# Patient Record
Sex: Female | Born: 1995
Health system: Southern US, Community
[De-identification: ages and names within clinical notes are randomized; demographics above are authoritative.]

## PROBLEM LIST (undated history)

## (undated) ENCOUNTER — Inpatient Hospital Stay (HOSPITAL_COMMUNITY): Payer: Self-pay

## (undated) DIAGNOSIS — R87629 Unspecified abnormal cytological findings in specimens from vagina: Secondary | ICD-10-CM

## (undated) DIAGNOSIS — F419 Anxiety disorder, unspecified: Secondary | ICD-10-CM

## (undated) DIAGNOSIS — O358XX0 Maternal care for other (suspected) fetal abnormality and damage, not applicable or unspecified: Secondary | ICD-10-CM

## (undated) HISTORY — PX: NO PAST SURGERIES: SHX2092

## (undated) HISTORY — DX: Anxiety disorder, unspecified: F41.9

## (undated) HISTORY — DX: Maternal care for other (suspected) fetal abnormality and damage, not applicable or unspecified: O35.8XX0

## (undated) HISTORY — DX: Unspecified abnormal cytological findings in specimens from vagina: R87.629

---
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
## 2015-01-06 ENCOUNTER — Ambulatory Visit: Payer: Self-pay | Admitting: Gynecology

## 2015-04-16 ENCOUNTER — Ambulatory Visit (INDEPENDENT_AMBULATORY_CARE_PROVIDER_SITE_OTHER): Payer: Managed Care, Other (non HMO) | Admitting: Family Medicine

## 2015-04-16 ENCOUNTER — Encounter: Payer: Self-pay | Admitting: Family Medicine

## 2015-04-16 VITALS — BP 118/78 | HR 59 | Ht 63.0 in | Wt 149.0 lb

## 2015-04-16 DIAGNOSIS — M545 Low back pain, unspecified: Secondary | ICD-10-CM | POA: Insufficient documentation

## 2015-04-16 NOTE — Progress Notes (Signed)
   Subjective:    Patient ID: Karen Burnett, female    DOB: 08/29/1996, 19 y.o.   MRN: [id number]  HPI  Intermittent diffuse low back pain for the last 4 years. 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 A massage therapist who sees her father or so joint problems examined her back and told her that her disks were inflamed. She's concerned and thought she should get that checked out.  Pain is mid low back, does not radiate into the buttock or the groin. No leg weakness. No radiation into the legs. 4-5 out of 10 at its worst. Not present when she wakes up in the morning. No bowel or bladder incontinence.  Review of Systems See history of present illness. Additionally, no fever, sweats, chills, unusual weight change.    Objective:   Physical Exam Vital signs are reviewed GENERAL: Well-developed female no acute distress BACK: No defect noted. Nontender to percussion of the vertebra. Mildly tender to palpation in the paravertebral muscles around L4-5 and over the PSIS bilaterally. She has mildly limited flexion at the hips secondary to hamstring tightness. Hyperextension mildly increases her low back pain but there is no radiation. She has normal lateral rotation. Extremity: Lower strandy strength 5 out of 5 in flexion extension hips, knees, ankle. NEURO: DTRs bilateral knee and ankle 2+ symmetrical. Intact sensation soft touch distally.       Assessment & Plan:  Muscle skeletal back pain. I gave her back exercise handout we'll have her focus on quadruped and Superman hyperextension exercises. I'll see her back in 3-4 weeks. I reassured her a do not think this is any disc related issue. I spent greater than 50% of our 35 minute office visit in counseling education regarding the nature of this back issue versus disc disease.

## 2015-04-30 ENCOUNTER — Encounter: Payer: Self-pay | Admitting: Gynecology

## 2015-04-30 ENCOUNTER — Ambulatory Visit (INDEPENDENT_AMBULATORY_CARE_PROVIDER_SITE_OTHER): Payer: Managed Care, Other (non HMO) | Admitting: Gynecology

## 2015-04-30 VITALS — BP 122/78 | Ht 62.25 in | Wt 148.0 lb

## 2015-04-30 DIAGNOSIS — N915 Oligomenorrhea, unspecified: Secondary | ICD-10-CM

## 2015-04-30 DIAGNOSIS — Z113 Encounter for screening for infections with a predominantly sexual mode of transmission: Secondary | ICD-10-CM

## 2015-04-30 DIAGNOSIS — Z01419 Encounter for gynecological examination (general) (routine) without abnormal findings: Secondary | ICD-10-CM

## 2015-04-30 DIAGNOSIS — Z30011 Encounter for initial prescription of contraceptive pills: Secondary | ICD-10-CM | POA: Diagnosis not present

## 2015-04-30 DIAGNOSIS — L709 Acne, unspecified: Secondary | ICD-10-CM

## 2015-04-30 DIAGNOSIS — N946 Dysmenorrhea, unspecified: Secondary | ICD-10-CM

## 2015-04-30 DIAGNOSIS — N898 Other specified noninflammatory disorders of vagina: Secondary | ICD-10-CM

## 2015-04-30 LAB — CBC WITH DIFFERENTIAL/PLATELET
BASOS PCT: 0 % (ref 0–1)
Basophils Absolute: 0 10*3/uL (ref 0.0–0.1)
EOS ABS: 0.1 10*3/uL (ref 0.0–0.7)
Eosinophils Relative: 1 % (ref 0–5)
HEMATOCRIT: 39.8 % (ref 36.0–46.0)
HEMOGLOBIN: 13.5 g/dL (ref 12.0–15.0)
LYMPHS PCT: 28 % (ref 12–46)
Lymphs Abs: 2 10*3/uL (ref 0.7–4.0)
MCH: 29.2 pg (ref 26.0–34.0)
MCHC: 33.9 g/dL (ref 30.0–36.0)
MCV: 86.1 fL (ref 78.0–100.0)
MONO ABS: 0.4 10*3/uL (ref 0.1–1.0)
MPV: 10.3 fL (ref 8.6–12.4)
Monocytes Relative: 6 % (ref 3–12)
NEUTROS ABS: 4.6 10*3/uL (ref 1.7–7.7)
NEUTROS PCT: 65 % (ref 43–77)
PLATELETS: 264 10*3/uL (ref 150–400)
RBC: 4.62 MIL/uL (ref 3.87–5.11)
RDW: 13.5 % (ref 11.5–15.5)
WBC: 7.1 10*3/uL (ref 4.0–10.5)

## 2015-04-30 LAB — WET PREP FOR TRICH, YEAST, CLUE
CLUE CELLS WET PREP: NONE SEEN
Trich, Wet Prep: NONE SEEN
WBC, Wet Prep HPF POC: NONE SEEN
YEAST WET PREP: NONE SEEN

## 2015-04-30 LAB — TSH: TSH: 0.422 u[IU]/mL (ref 0.350–4.500)

## 2015-04-30 LAB — PREGNANCY, URINE: PREG TEST UR: NEGATIVE

## 2015-04-30 MED ORDER — FLUCONAZOLE 150 MG PO TABS: ORAL_TABLET | ORAL | Status: DC

## 2015-04-30 MED ORDER — LEVONORGESTREL-ETHINYL ESTRAD 0.1-20 MG-MCG PO TABS: 1.0000 | ORAL_TABLET | Freq: Every day | ORAL | Status: DC

## 2015-04-30 NOTE — Progress Notes (Signed)
Karen MooreClarisa Burnett 08/20/1996 [id number] Patient is a 19 year old who presented to the office today for her first gynecological exam. Patient stated that she has completed a HPV vaccine in the past. She states that sometimes she'll go up to 2 months without a menstrual cycle when she is also suffering from acne. She does have her menses she complaints of menstrual cramping. She stated her last menstrual cycle was April 12 and she had use condom and Plan B. She denies any nausea, vomiting, breast tenderness. She denies any visual disturbances or any unusual headaches. Patient denies any past history of any STD. Patient also was having a slight vaginal discharge as well.    Past medical history,surgical history, family history and social history were all reviewed and documented in the EPIC chart.  Gynecologic History Patient's last menstrual period was 04/05/2015. Contraception: condoms Last Pap: No prior study. Results were: No prior study Last mammogram: Not indicated. Results were: Not indicated  Obstetric History OB History  Gravida Para Term Preterm AB SAB TAB Ectopic Multiple Living  0 0 0 0 0 0 0 0 0 0          ROS: A ROS was performed and pertinent positives and negatives are included in the history.  GENERAL: No fevers or chills. HEENT: No change in vision, no earache, sore throat or sinus congestion. NECK: No pain or stiffness. CARDIOVASCULAR: No chest pain or pressure. No palpitations. PULMONARY: No shortness of breath, cough or wheeze. GASTROINTESTINAL: No abdominal pain, nausea, vomiting or diarrhea, melena or bright red blood per rectum. GENITOURINARY: No urinary frequency, urgency, hesitancy or dysuria. MUSCULOSKELETAL: No joint or muscle pain, no back pain, no recent trauma. DERMATOLOGIC: No rash, no itching, no lesions. ENDOCRINE: No polyuria, polydipsia, no heat or cold intolerance. No recent change in weight. HEMATOLOGICAL: No anemia or easy bruising or bleeding. NEUROLOGIC: No  headache, seizures, numbness, tingling or weakness. PSYCHIATRIC: No depression, no loss of interest in normal activity or change in sleep pattern.     Exam: chaperone present  BP 122/78 mmHg  Ht 5' 2.25" (1.581 m)  Wt 148 lb (67.132 kg)  BMI 26.86 kg/m2  LMP 04/05/2015  Body mass index is 26.86 kg/(m^2).  General appearance : Well developed well nourished female. No acute distress HEENT: Eyes: no retinal hemorrhage or exudates,  Neck supple, trachea midline, no carotid bruits, no thyroidmegaly Lungs: Clear to auscultation, no rhonchi or wheezes, or rib retractions  Heart: Regular rate and rhythm, no murmurs or gallops Breast:Examined in sitting and supine position were symmetrical in appearance, no palpable masses or tenderness,  no skin retraction, no nipple inversion, no nipple discharge, no skin discoloration, no axillary or supraclavicular lymphadenopathy Abdomen: no palpable masses or tenderness, no rebound or guarding Extremities: no edema or skin discoloration or tenderness  Pelvic:  Bartholin, Urethra, Skene Glands: Within normal limits             Vagina: No gross lesions or discharge  Cervix: No gross lesions or discharge  Uterus  anteverted, normal size, shape and consistency, non-tender and mobile  Adnexa  Without masses or tenderness  Anus and perineum  normal   Rectovaginal  normal sphincter tone without palpated masses or tenderness             Hemoccult not indicated  Wet prep negative  Urine pregnancy test negative   Assessment/Plan:  19 y.o. female for annual exam with history of oligomenorrhea. We'll check a TSH and prolactin today. Patient was offered  to take Provera for 10 days to jump start her cycle but since she's: On vacation she rather wait until her cycle starts on its own. She is interested in starting oral contraceptive pill. The risks benefits and pros and cons were discussed include DVT and pulmonary embolism. She denies any family history of any  family members or personal history of clotting disorders. She will be started on avian 28 day oral contraceptive pill. A CBC, urinalysis will be obtained today as well. Pap smear not indicated this year according to the new guidelines. GC and Chlamydia culture pending at time of this dictation.   Ok EdwardsFERNANDEZ,JUAN H MD, 3:25 PM 04/30/2015

## 2015-04-30 NOTE — Patient Instructions (Signed)

## 2015-04-30 NOTE — Addendum Note (Signed)
Addended by: Berna SpareASTILLO, Dereon Williamsen A on: 04/30/2015 03:43 PM   Modules accepted: Orders

## 2015-04-30 NOTE — Addendum Note (Signed)
Addended by: Rushie GoltzSPANGLER, Josejuan Hoaglin on: 04/30/2015 03:42 PM   Modules accepted: Orders

## 2015-05-01 LAB — GC/CHLAMYDIA PROBE AMP
CT PROBE, AMP APTIMA: NEGATIVE
GC PROBE AMP APTIMA: NEGATIVE

## 2015-05-01 LAB — PROLACTIN: Prolactin: 14.4 ng/mL

## 2015-05-03 LAB — URINALYSIS W MICROSCOPIC + REFLEX CULTURE
BILIRUBIN URINE: NEGATIVE
GLUCOSE, UA: NEGATIVE mg/dL
Hgb urine dipstick: NEGATIVE
KETONES UR: NEGATIVE mg/dL
Leukocytes, UA: NEGATIVE
Nitrite: NEGATIVE
PROTEIN: NEGATIVE mg/dL
UROBILINOGEN UA: 0.2 mg/dL (ref 0.0–1.0)
pH: 5 (ref 5.0–8.0)

## 2015-05-14 ENCOUNTER — Ambulatory Visit: Payer: Managed Care, Other (non HMO) | Admitting: Family Medicine

## 2015-05-31 ENCOUNTER — Ambulatory Visit: Payer: Self-pay | Admitting: Family Medicine

## 2015-06-24 ENCOUNTER — Encounter: Payer: Self-pay | Admitting: Family Medicine

## 2015-06-24 ENCOUNTER — Ambulatory Visit (INDEPENDENT_AMBULATORY_CARE_PROVIDER_SITE_OTHER): Payer: Managed Care, Other (non HMO) | Admitting: Family Medicine

## 2015-06-24 VITALS — BP 106/63 | HR 90 | Temp 97.6°F | Ht 63.0 in | Wt 147.8 lb

## 2015-06-24 DIAGNOSIS — Z7189 Other specified counseling: Secondary | ICD-10-CM

## 2015-06-24 DIAGNOSIS — Z30017 Encounter for initial prescription of implantable subdermal contraceptive: Secondary | ICD-10-CM | POA: Insufficient documentation

## 2015-06-24 DIAGNOSIS — Z7689 Persons encountering health services in other specified circumstances: Secondary | ICD-10-CM

## 2015-06-24 NOTE — Patient Instructions (Signed)
Thank you for coming in,   Good luck to you with all of your studies.   Please bring all of your medications with you to each visit.    Please feel free to call with any questions or concerns at any time, at [phone]. --Dr. Jordan LikesSchmitz

## 2015-06-24 NOTE — Assessment & Plan Note (Signed)
No complaints. Currently a student at [employer] Educated on safe sex, exercise and diet.  - f/u in one year

## 2015-06-24 NOTE — Progress Notes (Addendum)
   Subjective:     HPI  Karen Burnett is here for est care.   [place] is [place] and has moved back [place] from [place]. Reports that her immunizations are up to date. 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  Plays lots of soccer.    24 hr food recall [place] food, mashed potatoes, corn.   Health Maintenance:  Health Maintenance Due  Topic Date Due  . HIV Screening  10/20/2009  . PAP SMEAR  10/20/2012  . TETANUS/TDAP  10/20/2013    -  reports that she has never smoked. She does not have any smokeless tobacco history on file. - Review of Systems: Per HPI. All other systems reviewed and are negative. - Past Medical History: Patient Active Problem List   Diagnosis Date Noted  . Low back pain 04/16/2015   - Medications: reviewed and updated Current Outpatient Prescriptions  Medication Sig Dispense Refill  . fluconazole (DIFLUCAN) 150 MG tablet Take one now repeat in 72 hours if needed 2 tablet 0  . levonorgestrel-ethinyl estradiol (AVIANE,ALESSE,LESSINA) 0.1-20 MG-MCG tablet Take 1 tablet by mouth daily. 1 Package 11  . naproxen sodium (ANAPROX) 220 MG tablet Take 220 mg by mouth 2 (two) times daily with a meal.     No current facility-administered medications for this visit.    Review of Systems See HPI     Objective:   Physical Exam BP 106/63 mmHg  Pulse 90  Temp(Src) 97.6 F (36.4 C) (Oral)  Ht 5\' 3"  (1.6 m)  Wt 147 lb 12.8 oz (67.042 kg)  BMI 26.19 kg/m2  LMP 06/12/2015 Gen: NAD, alert, cooperative with exam, well-appearing HEENT: NCAT, clear conjunctiva CV: RRR, good S1/S2, no murmur, no edema  Resp: CTABL, no wheezes, non-labored Abd: SNTND, BS present, no guarding or organomegaly Skin: no rashes, normal turgor  Neuro: no gross deficits.  MSK: 5/5 strength in LE b/l, normal sensation         Assessment & Plan:

## 2015-09-28 ENCOUNTER — Other Ambulatory Visit: Payer: Self-pay

## 2015-09-28 MED ORDER — LEVONORGESTREL-ETHINYL ESTRAD 0.1-20 MG-MCG PO TABS: 1.0000 | ORAL_TABLET | Freq: Every day | ORAL | Status: DC

## 2015-09-30 ENCOUNTER — Ambulatory Visit: Payer: Managed Care, Other (non HMO) | Admitting: Gynecology

## 2015-10-01 ENCOUNTER — Encounter: Payer: Self-pay | Admitting: Gynecology

## 2015-10-01 ENCOUNTER — Ambulatory Visit (INDEPENDENT_AMBULATORY_CARE_PROVIDER_SITE_OTHER): Payer: Managed Care, Other (non HMO) | Admitting: Gynecology

## 2015-10-01 VITALS — BP 112/78

## 2015-10-01 DIAGNOSIS — Z833 Family history of diabetes mellitus: Secondary | ICD-10-CM | POA: Diagnosis not present

## 2015-10-01 DIAGNOSIS — Z3041 Encounter for surveillance of contraceptive pills: Secondary | ICD-10-CM

## 2015-10-01 DIAGNOSIS — F329 Major depressive disorder, single episode, unspecified: Secondary | ICD-10-CM | POA: Diagnosis not present

## 2015-10-01 DIAGNOSIS — F32A Depression, unspecified: Secondary | ICD-10-CM

## 2015-10-01 LAB — TSH: TSH: 0.546 u[IU]/mL (ref 0.350–4.500)

## 2015-10-01 LAB — HEMOGLOBIN A1C
Hgb A1c MFr Bld: 5.1 % (ref <5.7)
Mean Plasma Glucose: 100 mg/dL (ref <117)

## 2015-10-01 MED ORDER — NORETHIN-ETH ESTRAD-FE BIPHAS 1 MG-10 MCG / 10 MCG PO TABS: 1.0000 | ORAL_TABLET | Freq: Every day | ORAL | Status: DC

## 2015-10-01 NOTE — Progress Notes (Signed)
   Patient presented to the office stating that since she was initiated on oral contraceptive pill in May of this year not only for contraception but to regulate her cycle but for acne that she has felt depressed and some time now 1 to get out of bed. She is on a 20 g oral contraceptive pill otherwise she had no problem with the medication. She does have a strong family history of diabetes. She denies any suicidal deviation has good family support and was in good spirits today.  We discussed different scenarios from taking her off the oral contraceptive pill altogether and using a nonhormonal IUD such as the ParaGard T380A IUD or go were on her estrogen from a 20 g ethenyl estradiol oral contraceptive pill to a 10 g pill we are going to let her finish this menstrual cycle do a total body washout for one month and then start her on lo Loestrin 10 g oral contraceptive pill. We will then monitor her cycle for 3 months if she still feels depressed we can go then with the nonhormonal ParaGard T380A IUD for which literature information was provided.  She will stop by the lab work was to check a hemoglobin A1c because her family history of diabetes and also we'll check her TSH to make sure is not hypothyroidism contributing to her symptoms.  Over 50% time was spent with the patient coordinate care and adjusting oral contraceptive pill due to her symptoms of depression.

## 2015-12-14 ENCOUNTER — Other Ambulatory Visit: Payer: Self-pay | Admitting: *Deleted

## 2015-12-14 MED ORDER — NORETHIN-ETH ESTRAD-FE BIPHAS 1 MG-10 MCG / 10 MCG PO TABS: 1.0000 | ORAL_TABLET | Freq: Every day | ORAL | Status: DC

## 2016-10-10 ENCOUNTER — Ambulatory Visit (INDEPENDENT_AMBULATORY_CARE_PROVIDER_SITE_OTHER): Payer: Managed Care, Other (non HMO) | Admitting: Gynecology

## 2016-10-10 ENCOUNTER — Other Ambulatory Visit: Payer: Self-pay | Admitting: Gynecology

## 2016-10-10 ENCOUNTER — Encounter: Payer: Self-pay | Admitting: Gynecology

## 2016-10-10 VITALS — BP 112/78

## 2016-10-10 DIAGNOSIS — R300 Dysuria: Secondary | ICD-10-CM | POA: Diagnosis not present

## 2016-10-10 DIAGNOSIS — R350 Frequency of micturition: Secondary | ICD-10-CM

## 2016-10-10 DIAGNOSIS — N3001 Acute cystitis with hematuria: Secondary | ICD-10-CM

## 2016-10-10 LAB — URINALYSIS W MICROSCOPIC + REFLEX CULTURE
Bilirubin Urine: NEGATIVE
Casts: NONE SEEN [LPF]
Crystals: NONE SEEN [HPF]
Glucose, UA: NEGATIVE
Ketones, ur: NEGATIVE
Nitrite: NEGATIVE
PH: 7 (ref 5.0–8.0)
Specific Gravity, Urine: 1.02 (ref 1.001–1.035)
YEAST: NONE SEEN [HPF]

## 2016-10-10 MED ORDER — PHENAZOPYRIDINE HCL 200 MG PO TABS: 200.0000 mg | ORAL_TABLET | Freq: Three times a day (TID) | ORAL | 0 refills | Status: DC | PRN

## 2016-10-10 MED ORDER — CIPROFLOXACIN HCL 250 MG PO TABS: 250.0000 mg | ORAL_TABLET | Freq: Two times a day (BID) | ORAL | 0 refills | Status: DC

## 2016-10-10 NOTE — Progress Notes (Signed)
   HPI: Patient is a 20 year old who presented to the office today stating that for the past 2 week she's had some suprapubic discomfort and this morning she began expressing frequency and dysuria and mild back discomfort. She denied any fever, chills, nausea, vomiting or any vaginal discharge. She has been the same sexual partner for 7 months now she had been on the oral contraceptive pill and has been using condoms.  ROS: A ROS was performed and pertinent positives and negatives are included in the history.  GENERAL: No fevers or chills. HEENT: No change in vision, no earache, sore throat or sinus congestion. NECK: No pain or stiffness. CARDIOVASCULAR: No chest pain or pressure. No palpitations. PULMONARY: No shortness of breath, cough or wheeze. GASTROINTESTINAL: No abdominal pain, nausea, vomiting or diarrhea, melena or bright red blood per rectum. GENITOURINARY: See above MUSCULOSKELETAL: No joint or muscle pain, no back pain, no recent trauma. DERMATOLOGIC: No rash, no itching, no lesions. ENDOCRINE: No polyuria, polydipsia, no heat or cold intolerance. No recent change in weight. HEMATOLOGICAL: No anemia or easy bruising or bleeding. NEUROLOGIC: No headache, seizures, numbness, tingling or weakness. PSYCHIATRIC: No depression, no loss of interest in normal activity or change in sleep pattern.   PE: Blood pressure 112/78 Gen. appearance well-developed well-nourished unit with the above-mentioned complaint Back: No CVA tenderness Abdomen: Suprapubic tenderness the remainder abdomen without rebound or guarding Pelvic: Bartholin urethra Skene was within normal limits Vagina: No lesions or discharge Cervix: No lesions or discharge Uterus: Anteverted normal size shape and consistency and suprapubic discomfort was described but no rebound or guarding Adnexa: No palpable masses or tenderness Rectal exam: Not done   Urinalysis: Packed WBC, packed RBC, many bacteria   Assessment Plan: Patient with  signs and symptoms and clinical evidence of urinary tract infection will be treated with Cipro 250 mg twice a day for 3 days along with Pyridium 250 mg 3 times a day for 3 days    Greater than 50% of time was spent in counseling and coordinating care of this patient.   Time of consultation: 15   Minutes.

## 2016-10-10 NOTE — Addendum Note (Signed)
Addended by: Berna SpareASTILLO, Naveen Lorusso A on: 10/10/2016 03:36 PM   Modules accepted: Orders

## 2016-10-10 NOTE — Patient Instructions (Addendum)
Phenazopyridine tablets  What is this medicine?  PHENAZOPYRIDINE (fen az oh PEER i deen) is a pain reliever. It is used to stop the pain, burning, or discomfort caused by infection or irritation of the urinary tract. This medicine is not an antibiotic. It will not cure a urinary tract infection.  This medicine may be used for other purposes; ask your health care provider or pharmacist if you have questions.  What should I tell my health care provider before I take this medicine?  They need to know if you have any of these conditions:  -glucose-6-phosphate dehydrogenase (G6PD) deficiency  -kidney disease  -an unusual or allergic reaction to phenazopyridine, other medicines, foods, dyes, or preservatives  -pregnant or trying to get pregnant  -breast-feeding  How should I use this medicine?  Take this medicine by mouth with a glass of water. Follow the directions on the prescription label. Take after meals. Take your doses at regular intervals. Do not take your medicine more often than directed. Do not skip doses or stop your medicine early even if you feel better. Do not stop taking except on your doctor's advice.  Talk to your pediatrician regarding the use of this medicine in children. Special care may be needed.  Overdosage: If you think you have taken too much of this medicine contact a poison control center or emergency room at once.  NOTE: This medicine is only for you. Do not share this medicine with others.  What if I miss a dose?  If you miss a dose, take it as soon as you can. If it is almost time for your next dose, take only that dose. Do not take double or extra doses.  What may interact with this medicine?  Interactions are not expected.  This list may not describe all possible interactions. Give your health care provider a list of all the medicines, herbs, non-prescription drugs, or dietary supplements you use. Also tell them if you smoke, drink alcohol, or use illegal drugs. Some items may interact  with your medicine.  What should I watch for while using this medicine?  Tell your doctor or health care professional if your symptoms do not improve or if they get worse.  This medicine colors body fluids red. This effect is harmless and will go away after you are done taking the medicine. It will change urine to an dark orange or red color. The red color may stain clothing. Soft contact lenses may become permanently stained. It is best not to wear soft contact lenses while taking this medicine.  If you are diabetic you may get a false positive result for sugar in your urine. Talk to your health care provider.  What side effects may I notice from receiving this medicine?  Side effects that you should report to your doctor or health care professional as soon as possible:  -allergic reactions like skin rash, itching or hives, swelling of the face, lips, or tongue  -blue or purple color of the skin  -difficulty breathing  -fever  -less urine  -unusual bleeding, bruising  -unusual tired, weak  -vomiting  -yellowing of the eyes or skin  Side effects that usually do not require medical attention (report to your doctor or health care professional if they continue or are bothersome):  -dark urine  -headache  -stomach upset  This list may not describe all possible side effects. Call your doctor for medical advice about side effects. You may report side effects to FDA at 1-800-FDA-1088.    expiration date. NOTE: This sheet is a summary. It may not cover all possible information. If you have questions about this medicine, talk to your doctor, pharmacist, or health care provider.    2016, Elsevier/Gold Standard. (2008-07-02 11:04:07) Ciprofloxacin tablets What is this medicine? CIPROFLOXACIN (sip roe FLOX  a sin) is a quinolone antibiotic. It is used to treat certain kinds of bacterial infections. It will not work for colds, flu, or other viral infections. This medicine may be used for other purposes; ask your health care provider or pharmacist if you have questions. What should I tell my health care provider before I take this medicine? They need to know if you have any of these conditions: -bone problems -cerebral disease -history of low levels of potassium in the blood -joint problems -irregular heartbeat -kidney disease -myasthenia gravis -seizures -tendon problems -tingling of the fingers or toes, or other nerve disorder -an unusual or allergic reaction to ciprofloxacin, other antibiotics or medicines, foods, dyes, or preservatives -pregnant or trying to get pregnant -breast-feeding How should I use this medicine? Take this medicine by mouth with a glass of water. Follow the directions on the prescription label. Take your medicine at regular intervals. Do not take your medicine more often than directed. Take all of your medicine as directed even if you think your are better. Do not skip doses or stop your medicine early. You can take this medicine with food or on an empty stomach. It can be taken with a meal that contains dairy or calcium, but do not take it alone with a dairy product, like milk or yogurt or calcium-fortified juice. A special MedGuide will be given to you by the pharmacist with each prescription and refill. Be sure to read this information carefully each time. Talk to your pediatrician regarding the use of this medicine in children. Special care may be needed. Overdosage: If you think you have taken too much of this medicine contact a poison control center or emergency room at once. NOTE: This medicine is only for you. Do not share this medicine with others. What if I miss a dose? If you miss a dose, take it as soon as you can. If it is almost time for your next dose,  take only that dose. Do not take double or extra doses. What may interact with this medicine? Do not take this medicine with any of the following medications: -cisapride -droperidol -terfenadine -tizanidine This medicine may also interact with the following medications: -antacids -birth control pills -caffeine -cyclosporin -didanosine (ddI) buffered tablets or powder -medicines for diabetes -medicines for inflammation like ibuprofen, naproxen -methotrexate -multivitamins -omeprazole -phenytoin -probenecid -sucralfate -theophylline -warfarin This list may not describe all possible interactions. Give your health care provider a list of all the medicines, herbs, non-prescription drugs, or dietary supplements you use. Also tell them if you smoke, drink alcohol, or use illegal drugs. Some items may interact with your medicine. What should I watch for while using this medicine? Tell your doctor or health care professional if your symptoms do not improve. Do not treat diarrhea with over the counter products. Contact your doctor if you have diarrhea that lasts more than 2 days or if it is severe and watery. You may get drowsy or dizzy. Do not drive, use machinery, or do anything that needs mental alertness until you know how this medicine affects you. Do not stand or sit up quickly, especially if you are an older patient. This reduces the risk of dizzy or fainting spells.  This medicine can make you more sensitive to the sun. Keep out of the sun. If you cannot avoid being in the sun, wear protective clothing and use sunscreen. Do not use sun lamps or tanning beds/booths. Avoid antacids, aluminum, calcium, iron, magnesium, and zinc products for 6 hours before and 2 hours after taking a dose of this medicine. What side effects may I notice from receiving this medicine? Side effects that you should report to your doctor or health care professional as soon as possible: -allergic reactions like  skin rash or hives, swelling of the face, lips, or tongue -anxious -confusion -depressed mood -diarrhea -fast, irregular heartbeat -hallucination, loss of contact with reality -joint, muscle, or tendon pain or swelling -pain, tingling, numbness in the hands or feet -suicidal thoughts or other mood changes -sunburn -unusually weak or tired Side effects that usually do not require medical attention (report to your doctor or health care professional if they continue or are bothersome): -dry mouth -headache -nausea -trouble sleeping This list may not describe all possible side effects. Call your doctor for medical advice about side effects. You may report side effects to FDA at 1-800-FDA-1088. Where should I keep my medicine? Keep out of the reach of children. Store at room temperature below 30 degrees C (86 degrees F). Keep container tightly closed. Throw away any unused medicine after the expiration date. NOTE: This sheet is a summary. It may not cover all possible information. If you have questions about this medicine, talk to your doctor, pharmacist, or health care provider.    2016, Elsevier/Gold Standard. (2015-07-15 12:57:02) Urinary Tract Infection A urinary tract infection (UTI) can occur any place along the urinary tract. The tract includes the kidneys, ureters, bladder, and urethra. A type of germ called bacteria often causes a UTI. UTIs are often helped with antibiotic medicine.  HOME CARE   If given, take antibiotics as told by your doctor. Finish them even if you start to feel better.  Drink enough fluids to keep your pee (urine) clear or pale yellow.  Avoid tea, drinks with caffeine, and bubbly (carbonated) drinks.  Pee often. Avoid holding your pee in for a long time.  Pee before and after having sex (intercourse).  Wipe from front to back after you poop (bowel movement) if you are a woman. Use each tissue only once. GET HELP RIGHT AWAY IF:   You have back  pain.  You have lower belly (abdominal) pain.  You have chills.  You feel sick to your stomach (nauseous).  You throw up (vomit).  Your burning or discomfort with peeing does not go away.  You have a fever.  Your symptoms are not better in 3 days. MAKE SURE YOU:   Understand these instructions.  Will watch your condition.  Will get help right away if you are not doing well or get worse.   This information is not intended to replace advice given to you by your health care provider. Make sure you discuss any questions you have with your health care provider.   Document Released: 05/22/2008 Document Revised: 12/25/2014 Document Reviewed: 07/04/2012 Elsevier Interactive Patient Education [employer]2016 Elsevier Inc.

## 2016-10-12 ENCOUNTER — Encounter: Payer: Managed Care, Other (non HMO) | Admitting: Gynecology

## 2016-10-12 LAB — URINE CULTURE

## 2016-10-18 DIAGNOSIS — R87629 Unspecified abnormal cytological findings in specimens from vagina: Secondary | ICD-10-CM

## 2016-10-18 HISTORY — DX: Unspecified abnormal cytological findings in specimens from vagina: R87.629

## 2016-10-20 ENCOUNTER — Ambulatory Visit (INDEPENDENT_AMBULATORY_CARE_PROVIDER_SITE_OTHER): Payer: Managed Care, Other (non HMO) | Admitting: Gynecology

## 2016-10-20 ENCOUNTER — Encounter: Payer: Self-pay | Admitting: Gynecology

## 2016-10-20 VITALS — BP 124/78 | Ht 63.0 in | Wt 162.0 lb

## 2016-10-20 DIAGNOSIS — R635 Abnormal weight gain: Secondary | ICD-10-CM | POA: Diagnosis not present

## 2016-10-20 DIAGNOSIS — Z113 Encounter for screening for infections with a predominantly sexual mode of transmission: Secondary | ICD-10-CM

## 2016-10-20 DIAGNOSIS — Z01411 Encounter for gynecological examination (general) (routine) with abnormal findings: Secondary | ICD-10-CM

## 2016-10-20 LAB — CBC WITH DIFFERENTIAL/PLATELET
BASOS PCT: 0 %
Basophils Absolute: 0 cells/uL (ref 0–200)
EOS PCT: 1 %
Eosinophils Absolute: 89 cells/uL (ref 15–500)
HCT: 38.8 % (ref 35.0–45.0)
Hemoglobin: 12.8 g/dL (ref 11.7–15.5)
LYMPHS PCT: 22 %
Lymphs Abs: 1958 cells/uL (ref 850–3900)
MCH: 28.6 pg (ref 27.0–33.0)
MCHC: 33 g/dL (ref 32.0–36.0)
MCV: 86.8 fL (ref 80.0–100.0)
MONO ABS: 445 {cells}/uL (ref 200–950)
MONOS PCT: 5 %
MPV: 9.7 fL (ref 7.5–12.5)
Neutro Abs: 6408 cells/uL (ref 1500–7800)
Neutrophils Relative %: 72 %
PLATELETS: 267 10*3/uL (ref 140–400)
RBC: 4.47 MIL/uL (ref 3.80–5.10)
RDW: 12.8 % (ref 11.0–15.0)
WBC: 8.9 10*3/uL (ref 3.8–10.8)

## 2016-10-20 LAB — HEMOGLOBIN A1C
HEMOGLOBIN A1C: 4.9 % (ref <5.7)
MEAN PLASMA GLUCOSE: 94 mg/dL

## 2016-10-20 LAB — TSH: TSH: 0.62 m[IU]/L

## 2016-10-20 LAB — CHOLESTEROL, TOTAL: Cholesterol: 134 mg/dL (ref 125–200)

## 2016-10-20 NOTE — Patient Instructions (Signed)
Intrauterine Device Information An intrauterine device (IUD) is inserted into your uterus to prevent pregnancy. There are two types of IUDs available:   Copper IUD--This type of IUD is wrapped in copper wire and is placed inside the uterus. Copper makes the uterus and fallopian tubes produce a fluid that kills sperm. The copper IUD can stay in place for 10 years.  Hormone IUD--This type of IUD contains the hormone progestin (synthetic progesterone). The hormone thickens the cervical mucus and prevents sperm from entering the uterus. It also thins the uterine lining to prevent implantation of a fertilized egg. The hormone can weaken or kill the sperm that get into the uterus. One type of hormone IUD can stay in place for 5 years, and another type can stay in place for 3 years. Your health care provider will make sure you are a good candidate for a contraceptive IUD. Discuss with your health care provider the possible side effects.  ADVANTAGES OF AN INTRAUTERINE DEVICE  IUDs are highly effective, reversible, long acting, and low maintenance.   There are no estrogen-related side effects.   An IUD can be used when breastfeeding.   IUDs are not associated with weight gain.   The copper IUD works immediately after insertion.   The hormone IUD works right away if inserted within 7 days of your period starting. You will need to use a backup method of birth control for 7 days if the hormone IUD is inserted at any other time in your cycle.  The copper IUD does not interfere with your female hormones.   The hormone IUD can make heavy menstrual periods lighter and decrease cramping.   The hormone IUD can be used for 3 or 5 years.   The copper IUD can be used for 10 years. DISADVANTAGES OF AN INTRAUTERINE DEVICE  The hormone IUD can be associated with irregular bleeding patterns.   The copper IUD can make your menstrual flow heavier and more painful.   You may experience cramping and  vaginal bleeding after insertion.    This information is not intended to replace advice given to you by your health care provider. Make sure you discuss any questions you have with your health care provider.   Document Released: 11/07/2004 Document Revised: 08/06/2013 Document Reviewed: 05/25/2013 Elsevier Interactive Patient Education 2016 Elsevier Inc.  

## 2016-10-20 NOTE — Progress Notes (Signed)
Karen MooreClarisa Burnett 10/19/1996 [id number]   History:    20 y.o.  for annual gyn exam with no complaints today. Last year she was started on oral contraceptive pill to regulate her cycle but she discontinued. Back in June stating she was gaining weight. She might skip one or 2 months at times but reports last menstrual period October 7 was normal. She is currently using condoms for contraception as a new sexual partner since last year would like to have an STD screen. She has her flu vaccine up-to-date. Patient also stated that several years ago she received 2 out of the 3 HPV vaccine series. Patient with no past history of any STDs.  Past medical history,surgical history, family history and social history were all reviewed and documented in the EPIC chart.  Gynecologic History Patient's last menstrual period was 09/23/2016. Contraception: condoms Last Pap: No previous study. Results were: No previous study Last mammogram: Not indicated. Results were: Indicated  Obstetric History OB History  Gravida Para Term Preterm AB Living  0 0 0 0 0 0  SAB TAB Ectopic Multiple Live Births  0 0 0 0           ROS: A ROS was performed and pertinent positives and negatives are included in the history.  GENERAL: No fevers or chills. HEENT: No change in vision, no earache, sore throat or sinus congestion. NECK: No pain or stiffness. CARDIOVASCULAR: No chest pain or pressure. No palpitations. PULMONARY: No shortness of breath, cough or wheeze. GASTROINTESTINAL: No abdominal pain, nausea, vomiting or diarrhea, melena or bright red blood per rectum. GENITOURINARY: No urinary frequency, urgency, hesitancy or dysuria. MUSCULOSKELETAL: No joint or muscle pain, no back pain, no recent trauma. DERMATOLOGIC: No rash, no itching, no lesions. ENDOCRINE: No polyuria, polydipsia, no heat or cold intolerance. No recent change in weight. HEMATOLOGICAL: No anemia or easy bruising or bleeding. NEUROLOGIC: No headache,  seizures, numbness, tingling or weakness. PSYCHIATRIC: No depression, no loss of interest in normal activity or change in sleep pattern.     Exam: chaperone present  BP 124/78   Ht 5\' 3"  (1.6 m)   Wt 162 lb (73.5 kg)   LMP 09/23/2016   BMI 28.70 kg/m   Body mass index is 28.7 kg/m.  General appearance : Well developed well nourished female. No acute distress HEENT: Eyes: no retinal hemorrhage or exudates,  Neck supple, trachea midline, no carotid bruits, no thyroidmegaly Lungs: Clear to auscultation, no rhonchi or wheezes, or rib retractions  Heart: Regular rate and rhythm, no murmurs or gallops Breast:Examined in sitting and supine position were symmetrical in appearance, no palpable masses or tenderness,  no skin retraction, no nipple inversion, no nipple discharge, no skin discoloration, no axillary or supraclavicular lymphadenopathy Abdomen: no palpable masses or tenderness, no rebound or guarding Extremities: no edema or skin discoloration or tenderness  Pelvic:  Bartholin, Urethra, Skene Glands: Within normal limits             Vagina: No gross lesions or discharge  Cervix: No gross lesions or discharge  Uterus  anteverted, normal size, shape and consistency, non-tender and mobile  Adnexa  Without masses or tenderness  Anus and perineum  normal   Rectovaginal  normal sphincter tone without palpated masses or tenderness             Hemoccult not indicated     Assessment/Plan:  20 y.o. female for annual exam will have the following screening STD testing: HIV, RPR, hepatitis B  and C along with GC and Chlamydia culture. Because of her weight gain we'll check a hemoglobin A1c and TSH also. Also some to a CBC along with a screening cholesterol and urinalysis. Pap smear without HPV was obtained today as well. Literature information on IUD provided.   Ok EdwardsFERNANDEZ,Perri Aragones H MD, 3:00 PM 10/20/2016

## 2016-10-21 LAB — GC/CHLAMYDIA PROBE AMP
CT Probe RNA: NOT DETECTED
GC Probe RNA: NOT DETECTED

## 2016-10-21 LAB — HEPATITIS C ANTIBODY: HCV Ab: NEGATIVE

## 2016-10-21 LAB — HIV ANTIBODY (ROUTINE TESTING W REFLEX): HIV 1&2 Ab, 4th Generation: NONREACTIVE

## 2016-10-21 LAB — HEPATITIS B SURFACE ANTIGEN: HEP B S AG: NEGATIVE

## 2016-10-21 LAB — RPR

## 2016-10-24 LAB — PAP IG W/ RFLX HPV ASCU

## 2016-10-25 LAB — HUMAN PAPILLOMAVIRUS, HIGH RISK: HPV DNA High Risk: NOT DETECTED

## 2016-10-30 ENCOUNTER — Telehealth: Payer: Self-pay | Admitting: *Deleted

## 2016-10-30 NOTE — Telephone Encounter (Signed)
[facility] [place] [facility] [employer] [facility] [place] [facility] [id number] [phone] Skyla and insertion covered 100% no copay no deduct.  KW CMA

## 2016-12-06 ENCOUNTER — Encounter: Payer: Self-pay | Admitting: Internal Medicine

## 2016-12-06 ENCOUNTER — Ambulatory Visit (INDEPENDENT_AMBULATORY_CARE_PROVIDER_SITE_OTHER): Payer: Managed Care, Other (non HMO) | Admitting: Internal Medicine

## 2016-12-06 VITALS — BP 108/58 | HR 119 | Temp 98.4°F | Ht 63.0 in | Wt 159.2 lb

## 2016-12-06 DIAGNOSIS — R112 Nausea with vomiting, unspecified: Secondary | ICD-10-CM

## 2016-12-06 MED ORDER — ONDANSETRON 4 MG PO TBDP: 4.0000 mg | ORAL_TABLET | Freq: Three times a day (TID) | ORAL | 0 refills | Status: DC | PRN

## 2016-12-06 NOTE — Progress Notes (Signed)
[facility] Progress Note  Subjective:  [name] is a 20 y.o. female who presents for SDA due to vomiting since last night. It began about 3 hours after dinner of leftover hamburger and fries. She also works at a pediatrician's office. She denies fever. She has had a little bit of a headache. She tried to take tylenol but could not keep it down. She had 1 loose stool. She threw up all night long but finally stopped around 0815 this a.m. Minimal abdominal pain. She reports she felt a little weak this morning but felt well enough to drive to appointment today. ROS: No blood in stool, no neck pain, no myalgias  No past medical history on file.  No Known Allergies  Social: Never smoker  Objective: Blood pressure (!) 108/58, pulse (!) 119, temperature 98.4 F (36.9 C), temperature source Oral, height 5\' 3"  (1.6 m), weight 159 lb 3.2 oz (72.2 kg). Constitutional: Somewhat tired appearing female, in NAD HENT: MMM. Normal TMs, no nasal discharge, no erythema of posterior orophaynx Neck: No lymphadenopathy, no neck stiffness or pain (FROM) Cardiovascular: Tachycardic but regular, S1, S2, no m/r/g.  Pulmonary/Chest: Effort normal and breath sounds normal. No respiratory distress.  Abdominal: Soft. +BS, minimal epigastric tenderness, NT, ND, no rebound or guarding.  Musculoskeletal: Negative Rovsing's and Psoas signs. Negative Kernig's sign.  Neurological: AOx3, no focal deficits. Skin: Skin is warm and dry. No rash noted. No erythema.  Psychiatric: Normal mood and affect.  Vitals reviewed  Assessment/Plan: Vomiting - Possibly 2/2 food poisoning given timing of symptoms after eating leftovers versus gastroenteritis given exposure to sick patients at work - Prescribed ODT zofran and recommended drinking plenty of fluids - Tachycardic but does not have dry MMM - Return if symptoms do not improve over the course of the day with zofran - Nonsurgical abdomen  Follow-up  prn.  Dani GobbleHillary Fitzgerald, MD [facility], PGY-2

## 2016-12-06 NOTE — Patient Instructions (Signed)
[name],  I suspect you have a gastrointestinal virus and should be feeling better in a day or two. Take zofran as needed to help you keep liquids down. If you still cannot tolerate fluids after today, please call clinic to let us know.  Thank you, Dr. Sampson GoonFitzgerald  Norovirus Infection A norovirus infection is caused by exposure to a virus in a group of similar viruses (noroviruses). This type of infection causes inflammation in your stomach and intestines (gastroenteritis). Norovirus is the most common cause of gastroenteritis. It also causes food poisoning. Anyone can get a norovirus infection. It spreads very easily (contagious). You can get it from contaminated food, water, surfaces, or other people. Norovirus is found in the stool or vomit of infected people. You can spread the infection as soon as you feel sick until 2 weeks after you recover.  Symptoms usually begin within 2 days after you become infected. Most norovirus symptoms affect the digestive system. CAUSES Norovirus infection is caused by contact with norovirus. You can catch norovirus if you:  Eat or drink something contaminated with norovirus.  Touch surfaces or objects contaminated with norovirus and then put your hand in your mouth.  Have direct contact with an infected person who has symptoms.  Share food, drink, or utensils with someone with who is sick with norovirus. SIGNS AND SYMPTOMS Symptoms of norovirus may include:  Nausea.  Vomiting.  Diarrhea.  Stomach cramps.  Fever.  Chills.  Headache.  Muscle aches.  Tiredness. DIAGNOSIS Your health care provider may suspect norovirus based on your symptoms and physical exam. Your health care provider may also test a sample of your stool or vomit for the virus.  TREATMENT There is no specific treatment for norovirus. Most people get better without treatment in about 2 days. HOME CARE INSTRUCTIONS  Replace lost fluids by drinking plenty of water or  rehydration fluids containing important minerals called electrolytes. This prevents dehydration. Drink enough fluid to keep your urine clear or pale yellow.  Do not prepare food for others while you are infected. Wait at least 3 days after recovering from the illness to do that. PREVENTION   Wash your hands often, especially after using the toilet or changing a diaper.  Wash fruits and vegetables thoroughly before preparing or serving them.  Throw out any food that a sick person may have touched.  Disinfect contaminated surfaces immediately after someone in the household has been sick. Use a bleach-based household cleaner.  Immediately remove and wash soiled clothes or sheets. SEEK MEDICAL CARE IF:  Your vomiting, diarrhea, and stomach pain is getting worse.  Your symptoms of norovirus do not go away after 2-3 days. SEEK IMMEDIATE MEDICAL CARE IF:  You develop symptoms of dehydration that do not improve with fluid replacement. This may include:  Excessive sleepiness.  Lack of tears.  Dry mouth.  Dizziness when standing.  Weak pulse. This information is not intended to replace advice given to you by your health care provider. Make sure you discuss any questions you have with your health care provider. Document Released: 02/24/2003 Document Revised: 12/25/2014 Document Reviewed: 05/14/2014 Elsevier Interactive Patient Education  2017 [employer]

## 2016-12-07 DIAGNOSIS — R1110 Vomiting, unspecified: Secondary | ICD-10-CM | POA: Insufficient documentation

## 2016-12-07 NOTE — Assessment & Plan Note (Addendum)
-   Possibly 2/2 food poisoning given timing of symptoms after eating leftovers versus gastroenteritis given exposure to sick patients at work - Prescribed ODT zofran and recommended drinking plenty of fluids - Tachycardic but does not have dry MMM - Return if symptoms do not improve over the course of the day with zofran - Nonsurgical abdomen

## 2016-12-16 ENCOUNTER — Other Ambulatory Visit: Payer: Self-pay | Admitting: Gynecology

## 2016-12-18 NOTE — L&D Delivery Note (Signed)
Patient is 21 y.o. G1P0000 [id number] admitted for SOL with SROM at 1000 on 08/18/17.  Prenatal course was uncomplicated.  Delivery Note At 11:55 AM a viable female was delivered via  (Presentation: cephalic; ROA).  APGAR: 8, 9; weight pending.   Placenta status: grossly normal, intact.  Cord: 3 vessel Cord pH: not drawn  Anesthesia:  Epidural Episiotomy:  Not needed Lacerations:  None Suture Repair: none needed Est. Blood Loss (mL):  50   Upon arrival patient was complete and pushing. She pushed with good maternal effort to deliver a viable female infant in cephalic, ROA position. No nuchal cord present.  Baby delivered without difficulty (anterior shoulder delivered with ease), was noted to have good tone and place on maternal abdomen for oral suctioning, drying and stimulation. Delayed cord clamping performed. Placenta delivered spontaneously with gentle cord traction. Fundus firm with massage and Pitocin. Perineum inspected and found to have no lacerations except for some vaginal abrasions, which were found to be hemostatic.  Counts of sharps, instruments, and lap pads were all correct.  Mom to postpartum.  Baby to Couplet care / Skin to Skin.   Amanda C. Frances FurbishWinfrey, MD PGY-1, [facility] 08/19/2017 12:11 PM

## 2017-01-02 ENCOUNTER — Ambulatory Visit (INDEPENDENT_AMBULATORY_CARE_PROVIDER_SITE_OTHER): Payer: Commercial Managed Care - PPO | Admitting: Internal Medicine

## 2017-01-02 ENCOUNTER — Encounter: Payer: Self-pay | Admitting: Internal Medicine

## 2017-01-02 VITALS — BP 104/80 | HR 77 | Temp 97.8°F | Ht 63.0 in | Wt 162.8 lb

## 2017-01-02 DIAGNOSIS — F411 Generalized anxiety disorder: Secondary | ICD-10-CM | POA: Diagnosis not present

## 2017-01-02 MED ORDER — CITALOPRAM HYDROBROMIDE 10 MG PO TABS: 10.0000 mg | ORAL_TABLET | Freq: Every day | ORAL | 0 refills | Status: DC

## 2017-01-02 MED ORDER — CITALOPRAM HYDROBROMIDE 10 MG PO TABS: ORAL_TABLET | ORAL | 0 refills | Status: DC
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
## 2017-01-02 NOTE — Assessment & Plan Note (Signed)
GAD7- 15 for anxiety  - Will try celexa -starting at 10 mg for 1 week; increasing to 20 mg for the next 3 weeks  - Follow up in 1 month  - discussed precautions and side effects

## 2017-01-02 NOTE — Patient Instructions (Addendum)
Please take 1 tablet for 1 week and then 2 tablets. Follow up with me in 1 moth or sooner as needed.    Generalized Anxiety Disorder Generalized anxiety disorder (GAD) is a mental disorder. It interferes with life functions, including relationships, work, and school. GAD is different from normal anxiety, which everyone experiences at some point in their lives in response to specific life events and activities. Normal anxiety actually helps us prepare for and get through these life events and activities. Normal anxiety goes away after the event or activity is over.  GAD causes anxiety that is not necessarily related to specific events or activities. It also causes excess anxiety in proportion to specific events or activities. The anxiety associated with GAD is also difficult to control. GAD can vary from mild to severe. People with severe GAD can have intense waves of anxiety with physical symptoms (panic attacks).  SYMPTOMS The anxiety and worry associated with GAD are difficult to control. This anxiety and worry are related to many life events and activities and also occur more days than not for 6 months or longer. People with GAD also have three or more of the following symptoms (one or more in children):  Restlessness.   Fatigue.  Difficulty concentrating.   Irritability.  Muscle tension.  Difficulty sleeping or unsatisfying sleep. DIAGNOSIS GAD is diagnosed through an assessment by your health care provider. Your health care provider will ask you questions aboutyour mood,physical symptoms, and events in your life. Your health care provider may ask you about your medical history and use of alcohol or drugs, including prescription medicines. Your health care provider may also do a physical exam and blood tests. Certain medical conditions and the use of certain substances can cause symptoms similar to those associated with GAD. Your health care provider may refer you to a mental health  specialist for further evaluation. TREATMENT The following therapies are usually used to treat GAD:   Medication. Antidepressant medication usually is prescribed for long-term daily control. Antianxiety medicines may be added in severe cases, especially when panic attacks occur.   Talk therapy (psychotherapy). Certain types of talk therapy can be helpful in treating GAD by providing support, education, and guidance. A form of talk therapy called cognitive behavioral therapy can teach you healthy ways to think about and react to daily life events and activities.  Stress managementtechniques. These include yoga, meditation, and exercise and can be very helpful when they are practiced regularly. A mental health specialist can help determine which treatment is best for you. Some people see improvement with one therapy. However, other people require a combination of therapies. This information is not intended to replace advice given to you by your health care provider. Make sure you discuss any questions you have with your health care provider. Document Released: 03/31/2013 Document Revised: 12/25/2014 Document Reviewed: 03/31/2013 Elsevier Interactive Patient Education  2017 [employer]
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
## 2017-01-17 ENCOUNTER — Ambulatory Visit (INDEPENDENT_AMBULATORY_CARE_PROVIDER_SITE_OTHER): Payer: Commercial Managed Care - PPO | Admitting: Student

## 2017-01-17 ENCOUNTER — Encounter: Payer: Self-pay | Admitting: Student

## 2017-01-17 VITALS — BP 110/58 | HR 112 | Temp 98.3°F | Ht 63.0 in | Wt 162.8 lb

## 2017-01-17 DIAGNOSIS — O09211 Supervision of pregnancy with history of pre-term labor, first trimester: Secondary | ICD-10-CM

## 2017-01-17 DIAGNOSIS — Z3200 Encounter for pregnancy test, result unknown: Secondary | ICD-10-CM | POA: Diagnosis not present

## 2017-01-17 DIAGNOSIS — F411 Generalized anxiety disorder: Secondary | ICD-10-CM

## 2017-01-17 DIAGNOSIS — Z3400 Encounter for supervision of normal first pregnancy, unspecified trimester: Secondary | ICD-10-CM | POA: Diagnosis not present

## 2017-01-17 DIAGNOSIS — Z3481 Encounter for supervision of other normal pregnancy, first trimester: Secondary | ICD-10-CM

## 2017-01-17 LAB — POCT URINALYSIS DIPSTICK
BILIRUBIN UA: NEGATIVE
Blood, UA: NEGATIVE
GLUCOSE UA: NEGATIVE
Ketones, UA: NEGATIVE
LEUKOCYTES UA: NEGATIVE
NITRITE UA: NEGATIVE
PH UA: 6.5
Protein, UA: NEGATIVE
Spec Grav, UA: >=1.03
Urobilinogen, UA: 0.2

## 2017-01-17 LAB — POCT URINE PREGNANCY: Preg Test, Ur: POSITIVE — AB

## 2017-01-17 MED ORDER — DOXYLAMINE-PYRIDOXINE 10-10 MG PO TBEC: 1.0000 | DELAYED_RELEASE_TABLET | Freq: Three times a day (TID) | ORAL | 1 refills | Status: DC | PRN

## 2017-01-17 MED ORDER — PRENATAL VITAMINS 0.8 MG PO TABS: 1.0000 | ORAL_TABLET | Freq: Every day | ORAL | 3 refills | Status: DC

## 2017-01-17 NOTE — Assessment & Plan Note (Signed)
Patient advised to continue celexa as it is Preg cat C

## 2017-01-17 NOTE — Patient Instructions (Addendum)
Follow up for new OB appointment as soon as possible Please obtain ultrasound If you have severe abdominal pain or vaginal bleeding [facility] Please take Prenatal vitamins daily

## 2017-01-17 NOTE — Progress Notes (Signed)
   Subjective:    Patient ID: Karen Burnett, female    DOB: 06/18/1996, 21 y.o.   MRN: [id number]   CC: pregnancy confirmation  HPI: 21 y/o F for pregnancy confirmation  Pregnancy - took pregnancy test 2 weeks ago and again yesterday which was positive - her last menstrual period was in 10/2016 but she has a history of irregular periods  - this was an unplanned pregnancy and she is unsure if she would like to keep the pregnanci -denies abdominal pain or vaginal bleeding - denies vaginal irritation or malodorous discharge - she has had daily nausea for the last 2 weeks but is able ot keep her food down - This is her first pregnancy  Anxiety - Recently started on Celexa for anxiety but she stopped taking it when she found out she was pregnancy - she feels her anxiety is controlled - she denies low mood  Smoking status reviewed  Review of Systems  Per HPI, else denies chest pain, shortness of breath    Objective:  BP (!) 110/58   Pulse (!) 112   Temp 98.3 F (36.8 C) (Oral)   Ht 5\' 3"  (1.6 m)   Wt 162 lb 12.8 oz (73.8 kg)   LMP 11/09/2016 (Approximate)   SpO2 99%   BMI 28.84 kg/m  Vitals and nursing note reviewed  General: NAD Cardiac: RRR,  Respiratory: CTAB, normal effort Abdomen: soft, nontender, nondistended Skin: warm and dry, no rashes noted Neuro: alert and oriented  Upreg + for pregnancy  Assessment & Plan:    Supervision of normal first pregnancy + Urine pregnancy test in clinic but unknown gestational age given unsure LMP.  - dating US ordered and scheduled in clinic - will obain new OB labs - discussed termination options should she decide to do this and we [facility] - if she decides to keep the pregnancy she will continue to follow with family medicine for prenatal care - PNV prescribed - diclegis for pregnancy associated nausea  Generalized anxiety disorder Patient advised to continue celexa as it is Preg  cat C     Abeeha Twist A. Kennon RoundsHaney MD, MS Family Medicine Resident PGY-3 Pager [phone]

## 2017-01-17 NOTE — Assessment & Plan Note (Addendum)
+   Urine pregnancy test in clinic but unknown gestational age given unsure LMP.  - dating US ordered and scheduled in clinic - will obain new OB labs - discussed termination options should she decide to do this and we discussed [facility] - if she decides to keep the pregnancy she will continue to follow with family medicine for prenatal care - PNV prescribed - diclegis for pregnancy associated nausea

## 2017-01-23 ENCOUNTER — Other Ambulatory Visit: Payer: Self-pay | Admitting: Student

## 2017-01-23 ENCOUNTER — Ambulatory Visit (HOSPITAL_COMMUNITY)
Admission: RE | Admit: 2017-01-23 | Discharge: 2017-01-23 | Disposition: A | Discharge status: Home / Self Care | Payer: Commercial Managed Care - PPO | Source: Ambulatory Visit | Attending: Family Medicine | Admitting: Family Medicine

## 2017-01-23 DIAGNOSIS — Z3481 Encounter for supervision of other normal pregnancy, first trimester: Secondary | ICD-10-CM

## 2017-01-29 NOTE — Progress Notes (Signed)
Cancelling order as lab not collected, no need to re-order 

## 2017-01-30 ENCOUNTER — Ambulatory Visit (HOSPITAL_COMMUNITY)
Admission: RE | Admit: 2017-01-30 | Discharge: 2017-01-30 | Disposition: A | Discharge status: Home / Self Care | Payer: Commercial Managed Care - PPO | Source: Ambulatory Visit | Attending: Family Medicine | Admitting: Family Medicine

## 2017-01-30 DIAGNOSIS — Z3A11 11 weeks gestation of pregnancy: Secondary | ICD-10-CM | POA: Insufficient documentation

## 2017-01-30 DIAGNOSIS — Z3491 Encounter for supervision of normal pregnancy, unspecified, first trimester: Secondary | ICD-10-CM | POA: Insufficient documentation

## 2017-01-31 ENCOUNTER — Telehealth: Payer: Self-pay

## 2017-01-31 NOTE — Telephone Encounter (Signed)
Pt would like results of US. Please call [phone]. Sunday SpillersSharon T Saunders, CMA

## 2017-02-01 ENCOUNTER — Encounter: Payer: Self-pay | Admitting: Internal Medicine

## 2017-02-01 NOTE — Telephone Encounter (Signed)
2nd request. ep °

## 2017-02-02 NOTE — Telephone Encounter (Signed)
Pt informed.  She is still trying to get insurance figured out.  She will call once this is done. [place]eger, Maryjo RochesterJessica Dawn, CMA

## 2017-02-02 NOTE — Telephone Encounter (Signed)
Please let patient know that her ultrasound is normal. Patient EGA is 11 weeks 1 day. She needs to set up an appointment for initial prenatal visit. Thanks Milton Streicher

## 2017-02-05 ENCOUNTER — Telehealth: Payer: Self-pay | Admitting: Internal Medicine

## 2017-02-05 NOTE — Telephone Encounter (Signed)
Received patient e-mail from the 2/15 today on the 2/19. However patient was informed of results of the 2/16. Therefore no need for further action

## 2017-02-07 ENCOUNTER — Encounter: Payer: Self-pay | Admitting: Internal Medicine

## 2017-02-15 LAB — URINE CULTURE, OB REFLEX

## 2017-02-15 LAB — CULTURE, OB URINE

## 2017-02-21 ENCOUNTER — Encounter: Payer: Self-pay | Admitting: Family Medicine

## 2017-02-21 ENCOUNTER — Other Ambulatory Visit: Payer: Self-pay | Admitting: Family Medicine

## 2017-02-22 LAB — SICKLE CELL SCREEN: SICKLE CELL SCREEN: NEGATIVE

## 2017-03-02 ENCOUNTER — Other Ambulatory Visit: Payer: Commercial Managed Care - PPO

## 2017-03-05 ENCOUNTER — Other Ambulatory Visit: Payer: Commercial Managed Care - PPO

## 2017-03-05 ENCOUNTER — Other Ambulatory Visit: Payer: Self-pay | Admitting: *Deleted

## 2017-03-05 DIAGNOSIS — F411 Generalized anxiety disorder: Secondary | ICD-10-CM

## 2017-03-05 MED ORDER — CITALOPRAM HYDROBROMIDE 10 MG PO TABS: ORAL_TABLET | ORAL | 0 refills | Status: DC

## 2017-03-09 ENCOUNTER — Encounter: Payer: Self-pay | Admitting: Family Medicine

## 2017-03-09 ENCOUNTER — Ambulatory Visit (INDEPENDENT_AMBULATORY_CARE_PROVIDER_SITE_OTHER): Payer: Commercial Managed Care - PPO | Admitting: Family Medicine

## 2017-03-09 VITALS — BP 110/60 | HR 94 | Temp 98.3°F | Wt 165.0 lb

## 2017-03-09 DIAGNOSIS — F411 Generalized anxiety disorder: Secondary | ICD-10-CM

## 2017-03-09 DIAGNOSIS — Z3400 Encounter for supervision of normal first pregnancy, unspecified trimester: Secondary | ICD-10-CM | POA: Diagnosis not present

## 2017-03-09 DIAGNOSIS — N926 Irregular menstruation, unspecified: Secondary | ICD-10-CM | POA: Insufficient documentation

## 2017-03-09 MED ORDER — PRENATAL GUMMIES/DHA & FA 0.4-32.5 MG PO CHEW: 1.0000 | CHEWABLE_TABLET | Freq: Every day | ORAL | 3 refills | Status: DC

## 2017-03-09 NOTE — Patient Instructions (Signed)
It was nice to meet you today.  - Try prenatal gummies, you can get them OTC just read to bottle for instructions. - Please make a lab visit appointment to have your blood work drawn.  - We will schedule your anatomy ultrasound to be done between 18-22weeks  Please make an appointment with OB clinic in 4 weeks.

## 2017-03-09 NOTE — Progress Notes (Signed)
Karen MooreClarisa Burnett is a 21 y.o. yo G1P0000 at 2681w4d who presents for her initial prenatal visit. Pregnancy is not planned She reports nausea. She  is taking PNV. See flow sheet for details.  PMH, POBH, FH, meds, allergies and Social Hx reviewed.  Prenatal Exam: Gen: Well nourished, well developed.  No distress.  Vitals noted. HEENT: Normocephalic, atraumatic.  Neck supple without cervical lymphadenopathy, thyromegaly or thyroid nodules.  Fair dentition. CV: RRR no murmur, gallops or rubs Lungs: CTAB.  Normal respiratory effort without wheezes or rales. Abd: soft, NTND. +BS.  Uterus not appreciated above pelvis. GU: Normal external female genitalia without lesions.  Normal vaginal, well rugated without lesions. No vaginal discharge.  Bimanual exam: No adnexal mass or TTP. No CMT.  Ext: No clubbing, cyanosis or edema. Psych: Normal grooming and dress.  Not depressed or anxious appearing.  Normal thought content and process without flight of ideas or looseness of associations.  Assessment & Plan: 1) 21 y.o. yo G1P0000 at 8581w4d via early ultrasound doing well.  Current pregnancy issues include none. Dating is reliable. Prenatal labs will be obtained, patient has had some prenatal labs done that were unremarkable but not the full panel. Genetic screening offered: Quad screen. Early glucola is not indicated.  PHQ-9 and Pregnancy Medical Home forms completed and reviewed.  Bleeding and pain precautions reviewed. Importance of prenatal vitamins reviewed.  Follow up in 4 weeks.

## 2017-03-09 NOTE — Assessment & Plan Note (Signed)
GAD7 scored 7 today and patient is coping well. Tried 1 dose of her celexa and never continued, agree with patient staying off medication at this time.

## 2017-03-12 ENCOUNTER — Inpatient Hospital Stay (HOSPITAL_COMMUNITY)
Admission: AD | Admit: 2017-03-12 | Discharge: 2017-03-12 | Disposition: A | Discharge status: Home / Self Care | Payer: Commercial Managed Care - PPO | Source: Ambulatory Visit | Attending: Obstetrics and Gynecology | Admitting: Obstetrics and Gynecology

## 2017-03-12 ENCOUNTER — Encounter (HOSPITAL_COMMUNITY): Payer: Self-pay | Admitting: *Deleted

## 2017-03-12 DIAGNOSIS — K219 Gastro-esophageal reflux disease without esophagitis: Secondary | ICD-10-CM | POA: Diagnosis not present

## 2017-03-12 DIAGNOSIS — Z3A17 17 weeks gestation of pregnancy: Secondary | ICD-10-CM | POA: Insufficient documentation

## 2017-03-12 DIAGNOSIS — O9989 Other specified diseases and conditions complicating pregnancy, childbirth and the puerperium: Secondary | ICD-10-CM

## 2017-03-12 DIAGNOSIS — R102 Pelvic and perineal pain: Secondary | ICD-10-CM | POA: Insufficient documentation

## 2017-03-12 DIAGNOSIS — R109 Unspecified abdominal pain: Secondary | ICD-10-CM | POA: Diagnosis not present

## 2017-03-12 DIAGNOSIS — Z3492 Encounter for supervision of normal pregnancy, unspecified, second trimester: Secondary | ICD-10-CM

## 2017-03-12 DIAGNOSIS — R1030 Lower abdominal pain, unspecified: Secondary | ICD-10-CM | POA: Diagnosis present

## 2017-03-12 DIAGNOSIS — O26892 Other specified pregnancy related conditions, second trimester: Secondary | ICD-10-CM

## 2017-03-12 DIAGNOSIS — O99612 Diseases of the digestive system complicating pregnancy, second trimester: Secondary | ICD-10-CM | POA: Insufficient documentation

## 2017-03-12 LAB — WET PREP, GENITAL
Clue Cells Wet Prep HPF POC: NONE SEEN
SPERM: NONE SEEN
Trich, Wet Prep: NONE SEEN
YEAST WET PREP: NONE SEEN

## 2017-03-12 LAB — URINALYSIS, ROUTINE W REFLEX MICROSCOPIC
BILIRUBIN URINE: NEGATIVE
Glucose, UA: NEGATIVE mg/dL
HGB URINE DIPSTICK: NEGATIVE
Ketones, ur: NEGATIVE mg/dL
Leukocytes, UA: NEGATIVE
Nitrite: NEGATIVE
Protein, ur: NEGATIVE mg/dL
SPECIFIC GRAVITY, URINE: 1.005 (ref 1.005–1.030)
pH: 6 (ref 5.0–8.0)

## 2017-03-12 NOTE — Discharge Instructions (Signed)
Abdominal Pain During Pregnancy  Abdominal pain is common in pregnancy. Most of the time, it does not cause harm. There are many causes of abdominal pain. Some causes are more serious than others and sometimes the cause is not known. Abdominal pain can be a sign that something is very wrong with the pregnancy or the pain may have nothing to do with the pregnancy. Always tell your health care provider if you have any abdominal pain.  Follow these instructions at home:  · Do not have sex or put anything in your vagina until your symptoms go away completely.  · Watch your abdominal pain for any changes.  · Get plenty of rest until your pain improves.  · Drink enough fluid to keep your urine clear or pale yellow.  · Take over-the-counter or prescription medicines only as told by your health care provider.  · Keep all follow-up visits as told by your health care provider. This is important.  Contact a health care provider if:  · You have a fever.  · Your pain gets worse or you have cramping.  · Your pain continues after resting.  Get help right away if:  · You are bleeding, leaking fluid, or passing tissue from the vagina.  · You have vomiting or diarrhea that does not go away.  · You have painful or bloody urination.  · You notice a decrease in your baby's movements.  · You feel very weak or faint.  · You have shortness of breath.  · You develop a severe headache with abdominal pain.  · You have abnormal vaginal discharge with abdominal pain.  This information is not intended to replace advice given to you by your health care provider. Make sure you discuss any questions you have with your health care provider.  Document Released: 12/04/2005 Document Revised: 09/14/2016 Document Reviewed: 07/03/2013  Elsevier Interactive Patient Education © 2017 [employer]

## 2017-03-12 NOTE — MAU Note (Signed)
Pt C/O lower abd & pelvic pressure that lasted several hours today.  Pain is not as bad now, pain was in waves.  This happened before @ 14 weeks.  Denies bleeding.

## 2017-03-12 NOTE — MAU Provider Note (Signed)
History     CSN: [id number]  Arrival date and time: 03/12/17 1821   First Provider Initiated Contact with Patient 03/12/17 1852      Chief Complaint  Patient presents with  . pelvic pressure  . Abdominal Pain   HPI  Pt is a 21 y/o G1P0 female at [redacted]w[redacted]d gestation with a medical hx significant for anxiety who presents today with waves of lower abdominal pain and pressure that lasted a few hours today. She is not currently having any pain or pressure.  She states the pressure started this morning. It occurs in episodes throughout the day and states "it feels like I'm going to push something out".  The pain resolves on its own. Standing makes it worse and she constantly has to get up and down for her job. She has not tried anything for it. She had similar pain a few weeks ago and was told it was round ligament pain. Denies any vaginal discharge or bleeding. No dysuria or frequency. No n/v/d/c. She admits to heartburn, mostly at night. She does admit to mild shortness of breath at times.  Denies smoking, alcohol or illicit drug use.     Past Medical History:  Diagnosis Date  . Anxiety   . Vaginal Pap smear, abnormal 10/2016   ASCUS, HPV neg so needs routine screen    Past Surgical History:  Procedure Laterality Date  . NO PAST SURGERIES      Family History  Problem Relation Age of Onset  . Thyroid disease Mother   . Diabetes Father   . Arthritis Father   . Psoriasis Father   . Psoriasis Brother   . Diabetes Maternal Aunt   . Diabetes Maternal Uncle   . Diabetes Paternal Aunt   . Diabetes Paternal Uncle   . Diabetes Maternal Grandmother   . Diabetes Maternal Grandfather   . Hypertension Maternal Grandfather   . Diabetes Paternal Grandmother   . Diabetes Paternal Grandfather     Social History  Substance Use Topics  . Smoking status: Never Smoker  . Smokeless tobacco: Never Used  . Alcohol use No    Allergies: No Known Allergies  Prescriptions Prior to Admission   Medication Sig Dispense Refill Last Dose  . Prenatal Multivit-Min-Fe-FA (PRENATAL VITAMINS) 0.8 MG tablet Take 1 tablet by mouth daily. 90 tablet 3   . Prenatal MV-Min-FA-Omega-3 (PRENATAL GUMMIES/DHA & FA) 0.4-32.5 MG CHEW Chew 1 each by mouth daily. 60 tablet 3     Review of Systems  Constitutional: Negative for chills and fever.  Respiratory: Positive for shortness of breath. Negative for chest tightness.   Cardiovascular: Negative for chest pain and leg swelling.  Gastrointestinal: Positive for abdominal pain. Negative for abdominal distention, constipation, diarrhea, nausea and vomiting.  Genitourinary: Positive for pelvic pain. Negative for difficulty urinating, dysuria, flank pain, frequency, vaginal bleeding and vaginal discharge.  Neurological: Negative for dizziness and headaches.   Physical Exam   Blood pressure 123/69, pulse 84, temperature 98.4 F (36.9 C), temperature source Oral, resp. rate 18, last menstrual period 11/09/2016.  Physical Exam  Constitutional: She is oriented to person, place, and time. She appears well-developed and well-nourished.  HENT:  Head: Normocephalic and atraumatic.  Cardiovascular: Normal rate, regular rhythm and normal heart sounds.   Respiratory: Effort normal and breath sounds normal. No respiratory distress.  GI: Soft. Bowel sounds are normal. She exhibits no mass. There is no tenderness. There is no rebound and no guarding.  Genitourinary: There is no rash or  lesion on the right labia. There is no rash or lesion on the left labia. Cervix exhibits discharge. Cervix exhibits no motion tenderness and no friability. Right adnexum displays no mass, no tenderness and no fullness. Left adnexum displays no mass, no tenderness and no fullness. No erythema, tenderness or bleeding in the vagina. No foreign body in the vagina. No signs of injury around the vagina. Vaginal discharge (white discharge present) found.  Genitourinary Comments: Cervix  closed/thick/-3/posterior  Neurological: She is alert and oriented to person, place, and time.  Skin: Skin is warm and dry.  Psychiatric: She has a normal mood and affect.    MAU Course  Procedures Results for orders placed or performed during the hospital encounter of 03/12/17 (from the past 24 hour(s))  Urinalysis, Routine w reflex microscopic     Status: Abnormal   Collection Time: 03/12/17  6:24 PM  Result Value Ref Range   Color, Urine STRAW (A) YELLOW   APPearance CLEAR CLEAR   Specific Gravity, Urine 1.005 1.005 - 1.030   pH 6.0 5.0 - 8.0   Glucose, UA NEGATIVE NEGATIVE mg/dL   Hgb urine dipstick NEGATIVE NEGATIVE   Bilirubin Urine NEGATIVE NEGATIVE   Ketones, ur NEGATIVE NEGATIVE mg/dL   Protein, ur NEGATIVE NEGATIVE mg/dL   Nitrite NEGATIVE NEGATIVE   Leukocytes, UA NEGATIVE NEGATIVE  Wet prep, genital     Status: Abnormal   Collection Time: 03/12/17  7:42 PM  Result Value Ref Range   Yeast Wet Prep HPF POC NONE SEEN NONE SEEN   Trich, Wet Prep NONE SEEN NONE SEEN   Clue Cells Wet Prep HPF POC NONE SEEN NONE SEEN   WBC, Wet Prep HPF POC FEW (A) NONE SEEN   Sperm NONE SEEN     MDM Patient is stable and currently without pain. Her abdominal exam is normal and she currently does not have vaginal bleeding or discharge.   Assessment and Plan  A:  Round Ligament Pain      GERD during Pregnancy  P: Ranitidine 150mg  at night  Reassure patient that this is normal round ligament pain. Recommend lifestyle modifications and repositioning.   Melburn PopperJames Kirby 03/12/2017, 7:02 PM     I confirm that I have verified the information documented in the PA student's note and that I have also personally reperformed the physical exam and all medical decision making activities.  Wet prep & GC/CT collected FHT 151 by doppler Cervix closed No evidence of infection or dehydration by u/a  A: 1. Fetal heart tones present, second trimester   2. Abdominal pain during pregnancy in second  trimester    P: Discharge home GC/CT pending Discussed reasons to return to MAU Keep f/u with OB

## 2017-03-13 LAB — GC/CHLAMYDIA PROBE AMP (~~LOC~~) NOT AT ARMC
Chlamydia: NEGATIVE
Neisseria Gonorrhea: NEGATIVE

## 2017-03-14 ENCOUNTER — Other Ambulatory Visit (INDEPENDENT_AMBULATORY_CARE_PROVIDER_SITE_OTHER): Payer: Commercial Managed Care - PPO

## 2017-03-14 DIAGNOSIS — Z3400 Encounter for supervision of normal first pregnancy, unspecified trimester: Secondary | ICD-10-CM | POA: Diagnosis not present

## 2017-03-14 NOTE — Progress Notes (Signed)
[facility] Karen Burnett, Karen Burnett Lee

## 2017-04-03 ENCOUNTER — Encounter (HOSPITAL_COMMUNITY): Payer: Self-pay

## 2017-04-03 ENCOUNTER — Inpatient Hospital Stay (HOSPITAL_COMMUNITY)
Admission: AD | Admit: 2017-04-03 | Discharge: 2017-04-03 | Disposition: A | Discharge status: Home / Self Care | Payer: Commercial Managed Care - PPO | Source: Ambulatory Visit | Attending: Family Medicine | Admitting: Family Medicine

## 2017-04-03 DIAGNOSIS — R109 Unspecified abdominal pain: Secondary | ICD-10-CM | POA: Diagnosis present

## 2017-04-03 DIAGNOSIS — O26892 Other specified pregnancy related conditions, second trimester: Secondary | ICD-10-CM | POA: Diagnosis not present

## 2017-04-03 DIAGNOSIS — Z3A20 20 weeks gestation of pregnancy: Secondary | ICD-10-CM | POA: Insufficient documentation

## 2017-04-03 LAB — URINALYSIS, ROUTINE W REFLEX MICROSCOPIC
Bilirubin Urine: NEGATIVE
GLUCOSE, UA: NEGATIVE mg/dL
Hgb urine dipstick: NEGATIVE
KETONES UR: NEGATIVE mg/dL
Nitrite: NEGATIVE
PH: 5 (ref 5.0–8.0)
Protein, ur: NEGATIVE mg/dL
SPECIFIC GRAVITY, URINE: 1.016 (ref 1.005–1.030)

## 2017-04-03 NOTE — MAU Provider Note (Signed)
History     CSN: [id number]  Arrival date and time: 04/03/17 1478   First Provider Initiated Contact with Patient 04/03/17 1001      Chief Complaint  Patient presents with  . Abdominal Pain   HPI [name] is a 21 y.o. G1P0000 at [redacted]w[redacted]d who presents to MAU today with complaint of lower abdominal tightening. She states that she noted a painful tightening in her lower abdomen multiple times over 45 minutes this morning right after arriving at work. She states that she pain resolved and has not continued. She denies pain now. She has not taken any pain medication. She denies vaginal bleeding, discharge, LOF or UTI symptoms. She states last intercourse 2 days ago. She has had round ligament pain in the past, but has not been wearing her abdominal binder consistently.   OB History    Gravida Para Term Preterm AB Living   1 0 0 0 0 0   SAB TAB Ectopic Multiple Live Births   0 0 0 0        Past Medical History:  Diagnosis Date  . Anxiety   . Vaginal Pap smear, abnormal 10/2016   ASCUS, HPV neg so needs routine screen    Past Surgical History:  Procedure Laterality Date  . NO PAST SURGERIES      Family History  Problem Relation Age of Onset  . Thyroid disease Mother   . Diabetes Father   . Arthritis Father   . Psoriasis Father   . Psoriasis Brother   . Diabetes Maternal Aunt   . Diabetes Maternal Uncle   . Diabetes Paternal Aunt   . Diabetes Paternal Uncle   . Diabetes Maternal Grandmother   . Diabetes Maternal Grandfather   . Hypertension Maternal Grandfather   . Diabetes Paternal Grandmother   . Diabetes Paternal Grandfather     Social History  Substance Use Topics  . Smoking status: Never Smoker  . Smokeless tobacco: Never Used  . Alcohol use No    Allergies: No Known Allergies  Prescriptions Prior to Admission  Medication Sig Dispense Refill Last Dose  . Prenatal Multivit-Min-Fe-FA (PRENATAL VITAMINS) 0.8 MG tablet Take 1 tablet by mouth daily.  90 tablet 3   . Prenatal MV-Min-FA-Omega-3 (PRENATAL GUMMIES/DHA & FA) 0.4-32.5 MG CHEW Chew 1 each by mouth daily. 60 tablet 3     Review of Systems  Gastrointestinal: Positive for abdominal pain. Negative for constipation, diarrhea, nausea and vomiting.  Genitourinary: Negative for dysuria, frequency, urgency, vaginal bleeding and vaginal discharge.   Physical Exam   Blood pressure 105/64, pulse 68, temperature 98.4 F (36.9 C), temperature source Oral, resp. rate 17, height  (1.575 m), last menstrual period 11/09/2016, SpO2 98 %.  Physical Exam  Nursing note and vitals reviewed. Constitutional: She is oriented to person, place, and time. She appears well-developed and well-nourished. No distress.  HENT:  Head: Normocephalic and atraumatic.  Cardiovascular: Normal rate.   Respiratory: Effort normal.  GI: Soft. She exhibits no distension and no mass. There is no tenderness. There is no rebound and no guarding.  Neurological: She is alert and oriented to person, place, and time.  Skin: Skin is warm and dry. No erythema.  Psychiatric: She has a normal mood and affect.  Dilation: Closed Effacement (%): Thick Cervical Position: Posterior Exam by:: julie wenzel,np  Results for orders placed or performed during the hospital encounter of 04/03/17 (from the past 24 hour(s))  Urinalysis, Routine w reflex microscopic  Status: Abnormal   Collection Time: 04/03/17  9:39 AM  Result Value Ref Range   Color, Urine YELLOW YELLOW   APPearance HAZY (A) CLEAR   Specific Gravity, Urine 1.016 1.005 - 1.030   pH 5.0 5.0 - 8.0   Glucose, UA NEGATIVE NEGATIVE mg/dL   Hgb urine dipstick NEGATIVE NEGATIVE   Bilirubin Urine NEGATIVE NEGATIVE   Ketones, ur NEGATIVE NEGATIVE mg/dL   Protein, ur NEGATIVE NEGATIVE mg/dL   Nitrite NEGATIVE NEGATIVE   Leukocytes, UA SMALL (A) NEGATIVE   RBC / HPF 0-5 0 - 5 RBC/hpf   WBC, UA 6-30 0 - 5 WBC/hpf   Bacteria, UA RARE (A) NONE SEEN   Squamous  Epithelial / LPF 6-30 (A) NONE SEEN   Mucous PRESENT      MAU Course  Procedures None  MDM FHR - 150 bpm with doppler UA today   Assessment and Plan  A: SIUP at [redacted]w[redacted]d Abdominal pain in pregnancy, second trimester  P: Discharge home Tylenol PRN for pain  Discussed consistent use of abdominal binder Increase PO hydration advised Second trimester precautions discussed Patient advised to follow-up with [facility] for routine prenatal care or sooner if symptoms worsen Patient may return to MAU as needed or if her condition were to change or worsen   Marny Lowenstein, PA-C  04/03/2017, 10:03 AM

## 2017-04-03 NOTE — Discharge Instructions (Signed)
Braxton Hicks Contractions °Contractions of the uterus can occur throughout pregnancy, but they are not always a sign that you are in labor. You may have practice contractions called Braxton Hicks contractions. These false labor contractions are sometimes confused with true labor. °What are Braxton Hicks contractions? °Braxton Hicks contractions are tightening movements that occur in the muscles of the uterus before labor. Unlike true labor contractions, these contractions do not result in opening (dilation) and thinning of the cervix. Toward the end of pregnancy (32-34 weeks), Braxton Hicks contractions can happen more often and may become stronger. These contractions are sometimes difficult to tell apart from true labor because they can be very uncomfortable. You should not feel embarrassed if you go to the hospital with false labor. °Sometimes, the only way to tell if you are in true labor is for your health care provider to look for changes in the cervix. The health care provider will do a physical exam and may monitor your contractions. If you are not in true labor, the exam should show that your cervix is not dilating and your water has not broken. °If there are no prenatal problems or other health problems associated with your pregnancy, it is completely safe for you to be sent home with false labor. You may continue to have Braxton Hicks contractions until you go into true labor. °How can I tell the difference between true labor and false labor? °· Differences °¨ False labor °¨ Contractions last 30-70 seconds.: Contractions are usually shorter and not as strong as true labor contractions. °¨ Contractions become very regular.: Contractions are usually irregular. °¨ Discomfort is usually felt in the top of the uterus, and it spreads to the lower abdomen and low back.: Contractions are often felt in the front of the lower abdomen and in the groin. °¨ Contractions do not go away with walking.: Contractions may  go away when you walk around or change positions while lying down. °¨ Contractions usually become more intense and increase in frequency.: Contractions get weaker and are shorter-lasting as time goes on. °¨ The cervix dilates and gets thinner.: The cervix usually does not dilate or become thin. °Follow these instructions at home: °¨ Take over-the-counter and prescription medicines only as told by your health care provider. °¨ Keep up with your usual exercises and follow other instructions from your health care provider. °¨ Eat and drink lightly if you think you are going into labor. °¨ If Braxton Hicks contractions are making you uncomfortable: °¨ Change your position from lying down or resting to walking, or change from walking to resting. °¨ Sit and rest in a tub of warm water. °¨ Drink enough fluid to keep your urine clear or pale yellow. Dehydration may cause these contractions. °¨ Do slow and deep breathing several times an hour. °¨ Keep all follow-up prenatal visits as told by your health care provider. This is important. °Contact a health care provider if: °¨ You have a fever. °¨ You have continuous pain in your abdomen. °Get help right away if: °¨ Your contractions become stronger, more regular, and closer together. °¨ You have fluid leaking or gushing from your vagina. °¨ You pass blood-tinged mucus (bloody show). °¨ You have bleeding from your vagina. °¨ You have low back pain that you never had before. °¨ You feel your baby’s head pushing down and causing pelvic pressure. °¨ Your baby is not moving inside you as much as it used to. °Summary °¨ Contractions that occur before labor are   called Braxton Hicks contractions, false labor, or practice contractions.  Braxton Hicks contractions are usually shorter, weaker, farther apart, and less regular than true labor contractions. True labor contractions usually become progressively stronger and regular and they become more frequent.  Manage discomfort from  [facility] contractions by changing position, resting in a warm bath, drinking plenty of water, or practicing deep breathing. This information is not intended to replace advice given to you by your health care provider. Make sure you discuss any questions you have with your health care provider. Document Released: 12/04/2005 Document Revised: 10/23/2016 Document Reviewed: 10/23/2016 Elsevier Interactive Patient Education  2017 [employer] Round Ligament Pain The round ligament is a cord of muscle and tissue that helps to support the uterus. It can become a source of pain during pregnancy if it becomes stretched or twisted as the baby grows. The pain usually begins in the second trimester of pregnancy, and it can come and go until the baby is delivered. It is not a serious problem, and it does not cause harm to the baby. Round ligament pain is usually a short, sharp, and pinching pain, but it can also be a dull, lingering, and aching pain. The pain is felt in the lower side of the abdomen or in the groin. It usually starts deep in the groin and moves up to the outside of the hip area. Pain can occur with:  A sudden change in position.  Rolling over in bed.  Coughing or sneezing.  Physical activity. Follow these instructions at home: Watch your condition for any changes. Take these steps to help with your pain:  When the pain starts, relax. Then try:  Sitting down.  Flexing your knees up to your abdomen.  Lying on your side with one pillow under your abdomen and another pillow between your legs.  Sitting in a warm bath for 15-20 minutes or until the pain goes away.  Take over-the-counter and prescription medicines only as told by your health care provider.  Move slowly when you sit and stand.  Avoid long walks if they cause pain.  Stop or lessen your physical activities if they cause pain. Contact a health care provider if:  Your pain does not go away with treatment.  You  feel pain in your back that you did not have before.  Your medicine is not helping. Get help right away if:  You develop a fever or chills.  You develop uterine contractions.  You develop vaginal bleeding.  You develop nausea or vomiting.  You develop diarrhea.  You have pain when you urinate. This information is not intended to replace advice given to you by your health care provider. Make sure you discuss any questions you have with your health care provider. Document Released: 09/12/2008 Document Revised: 05/11/2016 Document Reviewed: 02/10/2015 Elsevier Interactive Patient Education  2017 [employer].

## 2017-04-03 NOTE — MAU Note (Signed)
Pt c/o abdominal pain that comes in waves that started this morning around 0830. Pt c/o strong pressure in her bottom that started shortly after. Pt denies bleeding and leaking of fluid.

## 2017-04-06 ENCOUNTER — Other Ambulatory Visit: Payer: Self-pay | Admitting: Family Medicine

## 2017-04-06 ENCOUNTER — Ambulatory Visit (HOSPITAL_COMMUNITY)
Admission: RE | Admit: 2017-04-06 | Discharge: 2017-04-06 | Disposition: A | Discharge status: Home / Self Care | Payer: Commercial Managed Care - PPO | Source: Ambulatory Visit | Attending: Family Medicine | Admitting: Family Medicine

## 2017-04-06 DIAGNOSIS — Z3A20 20 weeks gestation of pregnancy: Secondary | ICD-10-CM | POA: Diagnosis not present

## 2017-04-06 DIAGNOSIS — Z3400 Encounter for supervision of normal first pregnancy, unspecified trimester: Secondary | ICD-10-CM

## 2017-04-06 DIAGNOSIS — Z3689 Encounter for other specified antenatal screening: Secondary | ICD-10-CM

## 2017-04-06 DIAGNOSIS — O321XX0 Maternal care for breech presentation, not applicable or unspecified: Secondary | ICD-10-CM | POA: Insufficient documentation

## 2017-04-09 ENCOUNTER — Encounter: Payer: Self-pay | Admitting: Internal Medicine

## 2017-04-09 ENCOUNTER — Encounter: Payer: Self-pay | Admitting: Family Medicine

## 2017-04-12 ENCOUNTER — Telehealth: Payer: Self-pay | Admitting: Family Medicine

## 2017-04-12 DIAGNOSIS — Z3492 Encounter for supervision of normal pregnancy, unspecified, second trimester: Secondary | ICD-10-CM

## 2017-04-12 NOTE — Telephone Encounter (Signed)
Discussed anatomy scan results with patient that incomplete and recommend follow-up ultrasound examination in 4 weeks forcompletion of fetal anatomic survey. Please schedule and inform patient. Thank you.

## 2017-04-16 NOTE — Telephone Encounter (Signed)
Scheduled [place] at [facility] for 04/23/2017 @ 3:45. Left message on patients cell to call back so I can provide her with this information.

## 2017-04-16 NOTE — Telephone Encounter (Signed)
Patient returned my call. Appointment day and time for U/S confirmed with patient.

## 2017-04-19 ENCOUNTER — Ambulatory Visit (INDEPENDENT_AMBULATORY_CARE_PROVIDER_SITE_OTHER): Payer: Commercial Managed Care - PPO | Admitting: Family Medicine

## 2017-04-19 DIAGNOSIS — F411 Generalized anxiety disorder: Secondary | ICD-10-CM

## 2017-04-19 DIAGNOSIS — Z3400 Encounter for supervision of normal first pregnancy, unspecified trimester: Secondary | ICD-10-CM

## 2017-04-19 NOTE — Patient Instructions (Addendum)
It was good to see you again! [facility]  For your pregnancy, - There are [facility]birth/parenting classes at [facility] [email or web address] - Keep taking prenatal vitamins daily. - Please get your blood drawn today for the OB panel today  You can make a lab visit when you are at least 24weeks for your glucose testing (at least 11 days from today).  I will see you back in 4 weeks with me for your next prenatal visit.  Dr. Leland HerElsia J Yoo, DO [facility]

## 2017-04-19 NOTE — Progress Notes (Signed)
Karen MooreClarisa Burnett is a 21 y.o. G1P0000 at 5253w3d [facility]  She reports that she is doing well. No leaking/bleeding/contractions. Good fetal movement daily. States her chronic anxiety is under good control and has good social support from her significant other and family members.  See flow sheet for details.  A/P: Pregnancy at 3353w3d.  Doing well.   Pregnancy issues include none. Anatomy scan reviewed, problems are not noted, but was incomplete. Follow up anatomy scan is scheduled for 04/23/17. Patient to have OB prenatal labs drawn today. Will schedule lab visit for gestational diabetes screening before next prenatal visit. Preterm labor precautions reviewed. Follow up 4 weeks.  Leland HerElsia J Brianah Hopson, DO PGY-1, [facility] 04/19/2017 9:54 AM

## 2017-04-19 NOTE — Assessment & Plan Note (Signed)
Under good control off medications. Patient has good social support.

## 2017-04-20 LAB — OBSTETRIC PANEL, INCLUDING HIV
Antibody Screen: NEGATIVE
BASOS ABS: 0 10*3/uL (ref 0.0–0.2)
Basos: 0 %
EOS (ABSOLUTE): 0.1 10*3/uL (ref 0.0–0.4)
Eos: 1 %
HEMATOCRIT: 39.3 % (ref 34.0–46.6)
HEP B S AG: NEGATIVE
HIV Screen 4th Generation wRfx: NONREACTIVE
Hemoglobin: 13.2 g/dL (ref 11.1–15.9)
IMMATURE GRANS (ABS): 0 10*3/uL (ref 0.0–0.1)
Immature Granulocytes: 0 %
LYMPHS ABS: 1.6 10*3/uL (ref 0.7–3.1)
LYMPHS: 15 %
MCH: 28.6 pg (ref 26.6–33.0)
MCHC: 33.6 g/dL (ref 31.5–35.7)
MCV: 85 fL (ref 79–97)
MONOS ABS: 0.4 10*3/uL (ref 0.1–0.9)
Monocytes: 4 %
NEUTROS PCT: 80 %
Neutrophils Absolute: 8.5 10*3/uL — ABNORMAL HIGH (ref 1.4–7.0)
PLATELETS: 272 10*3/uL (ref 150–379)
RBC: 4.61 x10E6/uL (ref 3.77–5.28)
RDW: 14.4 % (ref 12.3–15.4)
RPR: NONREACTIVE
Rh Factor: POSITIVE
Rubella Antibodies, IGG: 2.99 index (ref >0.99)
WBC: 10.6 10*3/uL (ref 3.4–10.8)

## 2017-04-23 ENCOUNTER — Other Ambulatory Visit: Payer: Self-pay | Admitting: Family Medicine

## 2017-04-23 ENCOUNTER — Ambulatory Visit (HOSPITAL_COMMUNITY)
Admission: RE | Admit: 2017-04-23 | Discharge: 2017-04-23 | Disposition: A | Discharge status: Home / Self Care | Payer: Commercial Managed Care - PPO | Source: Ambulatory Visit | Attending: Family Medicine | Admitting: Family Medicine

## 2017-04-23 DIAGNOSIS — Z362 Encounter for other antenatal screening follow-up: Secondary | ICD-10-CM | POA: Diagnosis present

## 2017-04-23 DIAGNOSIS — Z3492 Encounter for supervision of normal pregnancy, unspecified, second trimester: Secondary | ICD-10-CM | POA: Diagnosis present

## 2017-04-23 DIAGNOSIS — Z3A23 23 weeks gestation of pregnancy: Secondary | ICD-10-CM | POA: Insufficient documentation

## 2017-05-02 ENCOUNTER — Encounter: Payer: Self-pay | Admitting: Gynecology

## 2017-05-03 ENCOUNTER — Other Ambulatory Visit (INDEPENDENT_AMBULATORY_CARE_PROVIDER_SITE_OTHER): Payer: Commercial Managed Care - PPO

## 2017-05-03 DIAGNOSIS — Z3481 Encounter for supervision of other normal pregnancy, first trimester: Secondary | ICD-10-CM | POA: Diagnosis not present

## 2017-05-03 LAB — POCT 1 HR PRENATAL GLUCOSE: GLUCOSE 1 HR PRENATAL, POC: 123 mg/dL

## 2017-05-04 ENCOUNTER — Other Ambulatory Visit: Payer: Commercial Managed Care - PPO

## 2017-05-07 ENCOUNTER — Telehealth: Payer: Self-pay | Admitting: Family Medicine

## 2017-05-07 ENCOUNTER — Encounter: Payer: Self-pay | Admitting: Family Medicine

## 2017-05-07 ENCOUNTER — Other Ambulatory Visit: Payer: Self-pay | Admitting: *Deleted

## 2017-05-07 DIAGNOSIS — F411 Generalized anxiety disorder: Secondary | ICD-10-CM

## 2017-05-07 NOTE — Telephone Encounter (Signed)
Just an fyi

## 2017-05-07 NOTE — Telephone Encounter (Signed)
Pt is returning Page's call. She said that she does not want to take her anxiety medication while she is pregnant. She said that if she changes her mind or something happens she will make an appointment with her doctor to discuss. jw

## 2017-05-07 NOTE — Telephone Encounter (Signed)
Per discussion with patient on last 2 clinic visits on 04/19/17 and 03/09/17, patient was prescribed this medication but only took 1 pill in February and did not continue. Her GAD7 score was low at that time so if she would like to retrial, she should come in to be reassessed.

## 2017-05-07 NOTE — Telephone Encounter (Signed)
Attempted to contact pt, unable. Message left for pt to return my call.

## 2017-05-07 NOTE — Telephone Encounter (Signed)
Pt does not want to be on her anxiety medication while she is pregnancy.

## 2017-05-24 ENCOUNTER — Encounter: Payer: Self-pay | Admitting: Family Medicine

## 2017-05-24 ENCOUNTER — Ambulatory Visit (INDEPENDENT_AMBULATORY_CARE_PROVIDER_SITE_OTHER): Payer: Commercial Managed Care - PPO | Admitting: Family Medicine

## 2017-05-24 VITALS — BP 106/72 | HR 73 | Temp 98.1°F | Wt 172.0 lb

## 2017-05-24 DIAGNOSIS — Z3400 Encounter for supervision of normal first pregnancy, unspecified trimester: Secondary | ICD-10-CM | POA: Diagnosis not present

## 2017-05-24 DIAGNOSIS — O358XX0 Maternal care for other (suspected) fetal abnormality and damage, not applicable or unspecified: Secondary | ICD-10-CM

## 2017-05-24 DIAGNOSIS — O35EXX0 Maternal care for other (suspected) fetal abnormality and damage, fetal genitourinary anomalies, not applicable or unspecified: Secondary | ICD-10-CM

## 2017-05-24 DIAGNOSIS — Z23 Encounter for immunization: Secondary | ICD-10-CM

## 2017-05-24 HISTORY — DX: Maternal care for other (suspected) fetal abnormality and damage, not applicable or unspecified: O35.8XX0

## 2017-05-24 HISTORY — DX: Maternal care for other (suspected) fetal abnormality and damage, fetal genitourinary anomalies, not applicable or unspecified: O35.EXX0

## 2017-05-24 NOTE — Progress Notes (Signed)
Karen MooreClarisa Burnett is a 21 y.o. G1P0000 at 7557w3d here for routine follow up.  She reports she feels well and has no concerns. + fetal movement. No leaking/bleeding/contractions. See flow sheet for details.  Infant feeding choice: breast Contraception choice: nexplanon Infant circumcision desired yes, has information on scheduling  A/P: Pregnancy at 4557w3d.  Doing well.   Pregnancy issues include L mild/borderline UTD on anatomy scan - repeat ultrasound in 6 weeks from last, will schedule. Childbirth and education classes were offered, patient has plans to sign up today. Tdap was given today. Preterm labor and fetal movement precautions reviewed. Follow up in 2 weeks.  Leland HerElsia J Leoncio Hansen, DO PGY-1, [facility] 05/24/2017 4:07 PM

## 2017-05-24 NOTE — Patient Instructions (Signed)
It was great to see you again!  For your pregnancy today - Thank you for getting your TDAP - We will set you up for a followup ultrasound around 6 weeks from your last one.  I will see you back for your next OB visit in 2 weeks, we will do labs at that time  Take care and seek immediate care sooner if you develop any concerns.   Dr. Leland HerElsia J Yoo, DO [facility]   How a Baby Grows During Pregnancy Pregnancy begins when a female's sperm enters a female's egg (fertilization). This happens in one of the tubes (fallopian tubes) that connect the ovaries to the womb (uterus). The fertilized egg is called an embryo until it reaches 10 weeks. From 10 weeks until birth, it is called a fetus. The fertilized egg moves down the fallopian tube to the uterus. Then it implants into the lining of the uterus and begins to grow. The developing fetus receives oxygen and nutrients through the pregnant woman's bloodstream and the tissues that grow (placenta) to support the fetus. The placenta is the life support system for the fetus. It provides nutrition and removes waste. Learning as much as you can about your pregnancy and how your baby is developing can help you enjoy the experience. It can also make you aware of when there might be a problem and when to ask questions. How long does a typical pregnancy last? A pregnancy usually lasts 280 days, or about 40 weeks. Pregnancy is divided into three trimesters:  First trimester: 0-13 weeks.  Second trimester: 14-27 weeks.  Third trimester: 28-40 weeks.  The day when your baby is considered ready to be born (full term) is your estimated date of delivery. How does my baby develop month by month? First month  The fertilized egg attaches to the inside of the uterus.  Some cells will form the placenta. Others will form the fetus.  The arms, legs, brain, spinal cord, lungs, and heart begin to develop.  At the end of the first month, the heart  begins to beat.  Second month  The bones, inner ear, eyelids, hands, and feet form.  The genitals develop.  By the end of 8 weeks, all major organs are developing.  Third month  All of the internal organs are forming.  Teeth develop below the gums.  Bones and muscles begin to grow. The spine can flex.  The skin is transparent.  Fingernails and toenails begin to form.  Arms and legs continue to grow longer, and hands and feet develop.  The fetus is about 3 in (7.6 cm) long.  Fourth month  The placenta is completely formed.  The external sex organs, neck, outer ear, eyebrows, eyelids, and fingernails are formed.  The fetus can hear, swallow, and move its arms and legs.  The kidneys begin to produce urine.  The skin is covered with a white waxy coating (vernix) and very fine hair (lanugo).  Fifth month  The fetus moves around more and can be felt for the first time (quickening).  The fetus starts to sleep and wake up and may begin to suck its finger.  The nails grow to the end of the fingers.  The organ in the digestive system that makes bile (gallbladder) functions and helps to digest the nutrients.  If your baby is a girl, eggs are present in her ovaries. If your baby is a boy, testicles start to move down into his scrotum.  Sixth month  The lungs are formed, but the fetus is not yet able to breathe.  The eyes open. The brain continues to develop.  Your baby has fingerprints and toe prints. Your baby's hair grows thicker.  At the end of the second trimester, the fetus is about 9 in (22.9 cm) long.  Seventh month  The fetus kicks and stretches.  The eyes are developed enough to sense changes in light.  The hands can make a grasping motion.  The fetus responds to sound.  Eighth month  All organs and body systems are fully developed and functioning.  Bones harden and taste buds develop. The fetus may hiccup.  Certain areas of the brain are still  developing. The skull remains soft.  Ninth month  The fetus gains about  lb (0.23 kg) each week.  The lungs are fully developed.  Patterns of sleep develop.  The fetus's head typically moves into a head-down position (vertex) in the uterus to prepare for birth. If the buttocks move into a vertex position instead, the baby is breech.  The fetus weighs 6-9 lbs (2.72-4.08 kg) and is 19-20 in (48.26-50.8 cm) long.  What can I do to have a healthy pregnancy and help my baby develop? Eating and Drinking  Eat a healthy diet. ? Talk with your health care provider to make sure that you are getting the nutrients that you and your baby need. ? Visit www.[employer].be to learn about creating a healthy diet.  Gain a healthy amount of weight during pregnancy as advised by your health care provider. This is usually 25-35 pounds. You may need to: ? Gain more if you were underweight before getting pregnant or if you are pregnant with more than one baby. ? Gain less if you were overweight or obese when you got pregnant.  Medicines and Vitamins  Take prenatal vitamins as directed by your health care provider. These include vitamins such as folic acid, iron, calcium, and vitamin D. They are important for healthy development.  Take medicines only as directed by your health care provider. Read labels and ask a pharmacist or your health care provider whether over-the-counter medicines, supplements, and prescription drugs are safe to take during pregnancy.  Activities  Be physically active as advised by your health care provider. Ask your health care provider to recommend activities that are safe for you to do, such as walking or swimming.  Do not participate in strenuous or extreme sports.  Lifestyle  Do not drink alcohol.  Do not use any tobacco products, including cigarettes, chewing tobacco, or electronic cigarettes. If you need help quitting, ask your health care provider.  Do not use  illegal drugs.  Safety  Avoid exposure to mercury, lead, or other heavy metals. Ask your health care provider about common sources of these heavy metals.  Avoid listeria infection during pregnancy. Follow these precautions: ? Do not eat soft cheeses or deli meats. ? Do not eat hot dogs unless they have been warmed up to the point of steaming, such as in the microwave oven. ? Do not drink unpasteurized milk.  Avoid toxoplasmosis infection during pregnancy. Follow these precautions: ? Do not change your cat's litter box, if you have a cat. Ask someone else to do this for you. ? Wear gardening gloves while working in the yard.  General Instructions  Keep all follow-up visits as directed by your health care provider. This is important. This includes prenatal care and screening tests.  Manage any chronic health conditions. Work closely  with your health care provider to keep conditions, such as diabetes, under control.  How do I know if my baby is developing well? At each prenatal visit, your health care provider will do several different tests to check on your health and keep track of your baby's development. These include:  Fundal height. ? Your health care provider will measure your growing belly from top to bottom using a tape measure. ? Your health care provider will also feel your belly to determine your baby's position.  Heartbeat. ? An ultrasound in the first trimester can confirm pregnancy and show a heartbeat, depending on how far along you are. ? Your health care provider will check your baby's heart rate at every prenatal visit. ? As you get closer to your delivery date, you may have regular fetal heart rate monitoring to make sure that your baby is not in distress.  Second trimester ultrasound. ? This ultrasound checks your baby's development. It also indicates your baby's gender.  What should I do if I have concerns about my baby's development? Always talk with your  health care provider about any concerns that you may have. This information is not intended to replace advice given to you by your health care provider. Make sure you discuss any questions you have with your health care provider. Document Released: 05/22/2008 Document Revised: 05/11/2016 Document Reviewed: 05/13/2014 Elsevier Interactive Patient Education  [employer].

## 2017-05-25 ENCOUNTER — Telehealth: Payer: Self-pay | Admitting: Family Medicine

## 2017-05-25 NOTE — Telephone Encounter (Addendum)
Error

## 2017-06-04 ENCOUNTER — Ambulatory Visit (HOSPITAL_COMMUNITY): Payer: Commercial Managed Care - PPO

## 2017-06-07 ENCOUNTER — Encounter (HOSPITAL_COMMUNITY): Payer: Self-pay

## 2017-06-07 ENCOUNTER — Encounter: Payer: Self-pay | Admitting: Family Medicine

## 2017-06-07 ENCOUNTER — Ambulatory Visit (HOSPITAL_COMMUNITY)
Admission: RE | Admit: 2017-06-07 | Discharge: 2017-06-07 | Disposition: A | Discharge status: Home / Self Care | Payer: Commercial Managed Care - PPO | Source: Ambulatory Visit | Attending: Family Medicine | Admitting: Family Medicine

## 2017-06-07 DIAGNOSIS — Z3A29 29 weeks gestation of pregnancy: Secondary | ICD-10-CM | POA: Insufficient documentation

## 2017-06-07 DIAGNOSIS — O35EXX0 Maternal care for other (suspected) fetal abnormality and damage, fetal genitourinary anomalies, not applicable or unspecified: Secondary | ICD-10-CM

## 2017-06-07 DIAGNOSIS — O358XX0 Maternal care for other (suspected) fetal abnormality and damage, not applicable or unspecified: Secondary | ICD-10-CM | POA: Diagnosis not present

## 2017-06-18 ENCOUNTER — Ambulatory Visit (INDEPENDENT_AMBULATORY_CARE_PROVIDER_SITE_OTHER): Payer: Commercial Managed Care - PPO | Admitting: Family Medicine

## 2017-06-18 VITALS — BP 100/60 | HR 91 | Temp 98.8°F | Wt 178.0 lb

## 2017-06-18 DIAGNOSIS — Z3400 Encounter for supervision of normal first pregnancy, unspecified trimester: Secondary | ICD-10-CM

## 2017-06-18 NOTE — Patient Instructions (Addendum)
It was great to see you again!  - Continue your pool exercises - Try a maternity belt - A water based lubricant may be helpful   [facility] our front desk for counseling. Check out the calm app in the meantime  I will see you back in 2 weeks.  Take care,  Dr. Leland HerElsia J Maansi Wike, DO [facility]

## 2017-06-18 NOTE — Progress Notes (Signed)
Eustace MooreClarisa Vanlue is a 21 y.o. G1P0000 at 4945w0d here for routine follow up.  She reports: - Anxiety: feeling stressed about needing to save up money for maternity leave, getting closer to due date, painful sexual intercourse. Not interested in medications, wants exercises to help her cope better with her stressors - Low back pain: having sharp pains in b/l lower back, can feel tense knots, sometimes limits her ability to walk around, exercising in the pool helps.  See flow sheet for details.  A/P: Pregnancy at 5545w0d.  Doing well.   Pregnancy issues include: Anxiety - seems to be coping well with normal stressors, no red flags. [facility] and will make appointment to discuss further. Low back pain - recommended maternity belt  Infant feeding choice: breast Contraception choice: nexplanon Infant circumcision desired: yes  Tdap was given 05/24/17.  Preterm labor and fetal movement precautions reviewed. Safe sleep discussed. Follow up 2 weeks.

## 2017-07-06 ENCOUNTER — Ambulatory Visit (INDEPENDENT_AMBULATORY_CARE_PROVIDER_SITE_OTHER): Payer: Commercial Managed Care - PPO | Admitting: Family Medicine

## 2017-07-06 DIAGNOSIS — Z3403 Encounter for supervision of normal first pregnancy, third trimester: Secondary | ICD-10-CM

## 2017-07-06 NOTE — Progress Notes (Signed)
Karen MooreClarisa Burnett is a 21 y.o. G1P0000 at 312w4d here for routine follow up.  She reports:  - has been waking up periodically throughout the night.  - Low back pain is improved with maternity belt.  - wants to ask about saving cord blood, not donation.  See flow sheet for details.  A/P: Pregnancy at 112w4d.  Doing well.   Pregnancy issues include no significant concerns today.  Infant feeding choice: breast Contraception choice: nexplanon [facility]   Tdap was given 05/24/17.  Preterm labor and fetal movement precautions reviewed. Safe sleep discussed. Follow up 2 weeks in OB clinic. Discussed 3rd trimester labs and collecting GBS swab soon.

## 2017-07-06 NOTE — Patient Instructions (Addendum)
Follow up in OB clinic in 2 weeks. I will see you after that.  Third Trimester of Pregnancy The third trimester is from week 28 through week 40 (months 7 through 9). The third trimester is a time when the unborn baby (fetus) is growing rapidly. At the end of the ninth month, the fetus is about 20 inches in length and weighs 6-10 pounds. Body changes during your third trimester Your body will continue to go through many changes during pregnancy. The changes vary from woman to woman. During the third trimester:  Your weight will continue to increase. You can expect to gain 25-35 pounds (11-16 kg) by the end of the pregnancy.  You may begin to get stretch marks on your hips, abdomen, and breasts.  You may urinate more often because the fetus is moving lower into your pelvis and pressing on your bladder.  You may develop or continue to have heartburn. This is caused by increased hormones that slow down muscles in the digestive tract.  You may develop or continue to have constipation because increased hormones slow digestion and cause the muscles that push waste through your intestines to relax.  You may develop hemorrhoids. These are swollen veins (varicose veins) in the rectum that can itch or be painful.  You may develop swollen, bulging veins (varicose veins) in your legs.  You may have increased body aches in the pelvis, back, or thighs. This is due to weight gain and increased hormones that are relaxing your joints.  You may have changes in your hair. These can include thickening of your hair, rapid growth, and changes in texture. Some women also have hair loss during or after pregnancy, or hair that feels dry or thin. Your hair will most likely return to normal after your baby is born.  Your breasts will continue to grow and they will continue to become tender. A yellow fluid (colostrum) may leak from your breasts. This is the first milk you are producing for your baby.  Your belly  button may stick out.  You may notice more swelling in your hands, face, or ankles.  You may have increased tingling or numbness in your hands, arms, and legs. The skin on your belly may also feel numb.  You may feel short of breath because of your expanding uterus.  You may have more problems sleeping. This can be caused by the size of your belly, increased need to urinate, and an increase in your body's metabolism.  You may notice the fetus "dropping," or moving lower in your abdomen (lightening).  You may have increased vaginal discharge.  You may notice your joints feel loose and you may have pain around your pelvic bone.  What to expect at prenatal visits You will have prenatal exams every 2 weeks until week 36. Then you will have weekly prenatal exams. During a routine prenatal visit:  You will be weighed to make sure you and the baby are growing normally.  Your blood pressure will be taken.  Your abdomen will be measured to track your baby's growth.  The fetal heartbeat will be listened to.  Any test results from the previous visit will be discussed.  You may have a cervical check near your due date to see if your cervix has softened or thinned (effaced).  You will be tested for Group B streptococcus. This happens between 35 and 37 weeks.  Your health care provider may ask you:  What your birth plan is.  How you  are feeling.  If you are feeling the baby move.  If you have had any abnormal symptoms, such as leaking fluid, bleeding, severe headaches, or abdominal cramping.  If you are using any tobacco products, including cigarettes, chewing tobacco, and electronic cigarettes.  If you have any questions.  Other tests or screenings that may be performed during your third trimester include:  Blood tests that check for low iron levels (anemia).  Fetal testing to check the health, activity level, and growth of the fetus. Testing is done if you have certain medical  conditions or if there are problems during the pregnancy.  Nonstress test (NST). This test checks the health of your baby to make sure there are no signs of problems, such as the baby not getting enough oxygen. During this test, a belt is placed around your belly. The baby is made to move, and its heart rate is monitored during movement.  What is false labor? False labor is a condition in which you feel small, irregular tightenings of the muscles in the womb (contractions) that usually go away with rest, changing position, or drinking water. These are called Braxton Hicks contractions. Contractions may last for hours, days, or even weeks before true labor sets in. If contractions come at regular intervals, become more frequent, increase in intensity, or become painful, you should see your health care provider. What are the signs of labor?  Abdominal cramps.  Regular contractions that start at 10 minutes apart and become stronger and more frequent with time.  Contractions that start on the top of the uterus and spread down to the lower abdomen and back.  Increased pelvic pressure and dull back pain.  A watery or bloody mucus discharge that comes from the vagina.  Leaking of amniotic fluid. This is also known as your "water breaking." It could be a slow trickle or a gush. Let your health care provider know if it has a color or strange odor. If you have any of these signs, call your health care provider right away, even if it is before your due date. Follow these instructions at home: Medicines  Follow your health care provider's instructions regarding medicine use. Specific medicines may be either safe or unsafe to take during pregnancy.  Take a prenatal vitamin that contains at least 600 micrograms (mcg) of folic acid.  If you develop constipation, try taking a stool softener if your health care provider approves. Eating and drinking  Eat a balanced diet that includes fresh fruits and  vegetables, whole grains, good sources of protein such as meat, eggs, or tofu, and low-fat dairy. Your health care provider will help you determine the amount of weight gain that is right for you.  Avoid raw meat and uncooked cheese. These carry germs that can cause birth defects in the baby.  If you have low calcium intake from food, talk to your health care provider about whether you should take a daily calcium supplement.  Eat four or five small meals rather than three large meals a day.  Limit foods that are high in fat and processed sugars, such as fried and sweet foods.  To prevent constipation: ? Drink enough fluid to keep your urine clear or pale yellow. ? Eat foods that are high in fiber, such as fresh fruits and vegetables, whole grains, and beans. Activity  Exercise only as directed by your health care provider. Most women can continue their usual exercise routine during pregnancy. Try to exercise for 30 minutes at  least 5 days a week. Stop exercising if you experience uterine contractions.  Avoid heavy lifting.  Do not exercise in extreme heat or humidity, or at high altitudes.  Wear low-heel, comfortable shoes.  Practice good posture.  You may continue to have sex unless your health care provider tells you otherwise. Relieving pain and discomfort  Take frequent breaks and rest with your legs elevated if you have leg cramps or low back pain.  Take warm sitz baths to soothe any pain or discomfort caused by hemorrhoids. Use hemorrhoid cream if your health care provider approves.  Wear a good support bra to prevent discomfort from breast tenderness.  If you develop varicose veins: ? Wear support pantyhose or compression stockings as told by your healthcare provider. ? Elevate your feet for 15 minutes, 3-4 times a day. Prenatal care  Write down your questions. Take them to your prenatal visits.  Keep all your prenatal visits as told by your health care provider. This  is important. Safety  Wear your seat belt at all times when driving.  Make a list of emergency phone numbers, including numbers for family, friends, the hospital, and police and fire departments. General instructions  Avoid cat litter boxes and soil used by cats. These carry germs that can cause birth defects in the baby. If you have a cat, ask someone to clean the litter box for you.  Do not travel far distances unless it is absolutely necessary and only with the approval of your health care provider.  Do not use hot tubs, steam rooms, or saunas.  Do not drink alcohol.  Do not use any products that contain nicotine or tobacco, such as cigarettes and e-cigarettes. If you need help quitting, ask your health care provider.  Do not use any medicinal herbs or unprescribed drugs. These chemicals affect the formation and growth of the baby.  Do not douche or use tampons or scented sanitary pads.  Do not cross your legs for long periods of time.  To prepare for the arrival of your baby: ? Take prenatal classes to understand, practice, and ask questions about labor and delivery. ? Make a trial run to the hospital. ? Visit the hospital and tour the maternity area. ? Arrange for maternity or paternity leave through employers. ? Arrange for family and friends to take care of pets while you are in the hospital. ? Purchase a rear-facing car seat and make sure you know how to install it in your car. ? Pack your hospital bag. ? Prepare the baby's nursery. Make sure to remove all pillows and stuffed animals from the baby's crib to prevent suffocation.  Visit your dentist if you have not gone during your pregnancy. Use a soft toothbrush to brush your teeth and be gentle when you floss. Contact a health care provider if:  You are unsure if you are in labor or if your water has broken.  You become dizzy.  You have mild pelvic cramps, pelvic pressure, or nagging pain in your abdominal  area.  You have lower back pain.  You have persistent nausea, vomiting, or diarrhea.  You have an unusual or bad smelling vaginal discharge.  You have pain when you urinate. Get help right away if:  Your water breaks before 37 weeks.  You have regular contractions less than 5 minutes apart before 37 weeks.  You have a fever.  You are leaking fluid from your vagina.  You have spotting or bleeding from your vagina.  You  have severe abdominal pain or cramping.  You have rapid weight loss or weight gain.  You have shortness of breath with chest pain.  You notice sudden or extreme swelling of your face, hands, ankles, feet, or legs.  Your baby makes fewer than 10 movements in 2 hours.  You have severe headaches that do not go away when you take medicine.  You have vision changes. Summary  The third trimester is from week 28 through week 40, months 7 through 9. The third trimester is a time when the unborn baby (fetus) is growing rapidly.  During the third trimester, your discomfort may increase as you and your baby continue to gain weight. You may have abdominal, leg, and back pain, sleeping problems, and an increased need to urinate.  During the third trimester your breasts will keep growing and they will continue to become tender. A yellow fluid (colostrum) may leak from your breasts. This is the first milk you are producing for your baby.  False labor is a condition in which you feel small, irregular tightenings of the muscles in the womb (contractions) that eventually go away. These are called Braxton Hicks contractions. Contractions may last for hours, days, or even weeks before true labor sets in.  Signs of labor can include: abdominal cramps; regular contractions that start at 10 minutes apart and become stronger and more frequent with time; watery or bloody mucus discharge that comes from the vagina; increased pelvic pressure and dull back pain; and leaking of amniotic  fluid. This information is not intended to replace advice given to you by your health care provider. Make sure you discuss any questions you have with your health care provider. Document Released: 11/28/2001 Document Revised: 05/11/2016 Document Reviewed: 02/04/2013 Elsevier Interactive Patient Education  2017 [employer].

## 2017-07-18 ENCOUNTER — Encounter: Payer: Commercial Managed Care - PPO | Admitting: Family Medicine

## 2017-07-18 NOTE — Progress Notes (Deleted)
  Karen MooreClarisa Burnett is a 21 y.o. G1P0000 at 6533w2d here for routine follow up.  She reports ***. See flow sheet for details.  A/P: Pregnancy at 7533w2d.  Doing well.   Pregnancy issues include ***.  Infant feeding choice: breast Contraception choice: nexplanon Infant circumcision desired: yes- [facility]  Tdap was not given today. (given in July) GBS and gc/chlamydia testing was performed today.  Preterm labor and fetal movement precautions reviewed. Safe sleep discussed. Follow up 2 week(s).

## 2017-07-23 ENCOUNTER — Encounter: Payer: Commercial Managed Care - PPO | Admitting: Family Medicine

## 2017-08-09 ENCOUNTER — Ambulatory Visit (INDEPENDENT_AMBULATORY_CARE_PROVIDER_SITE_OTHER): Payer: Commercial Managed Care - PPO | Admitting: Family Medicine

## 2017-08-09 ENCOUNTER — Other Ambulatory Visit (HOSPITAL_COMMUNITY)
Admission: RE | Admit: 2017-08-09 | Discharge: 2017-08-09 | Disposition: A | Discharge status: Home / Self Care | Payer: Commercial Managed Care - PPO | Source: Ambulatory Visit | Attending: Family Medicine | Admitting: Family Medicine

## 2017-08-09 VITALS — BP 122/78 | HR 82 | Temp 98.3°F | Wt 184.0 lb

## 2017-08-09 DIAGNOSIS — Z3403 Encounter for supervision of normal first pregnancy, third trimester: Secondary | ICD-10-CM | POA: Insufficient documentation

## 2017-08-09 DIAGNOSIS — O26843 Uterine size-date discrepancy, third trimester: Secondary | ICD-10-CM

## 2017-08-09 NOTE — Patient Instructions (Addendum)
Getting ultrasound.  If you have any contractions which occur 7-10 minutes apart, vaginal bleeding, fluid leaking, or are worried that baby is not moving well, go immediately to [facility] to be evaluated.   Flu shot today  See handout below re: kick counts.  bloodwork & swabs today  Next visit in 1 week.  Be well, Dr. Pollie Meyer         Fetal Movement Counts Karen Burnett Karen Due Date: ____________________ What is a fetal movement count? A fetal movement count is the number of times that you feel your baby move during a certain amount of time. This may also be called a fetal kick count. A fetal movement count is recommended for every pregnant woman. You may be asked to start counting fetal movements as early as week 28 of your pregnancy. Pay attention to when your baby is most active. You may notice your baby's sleep and wake cycles. You may also notice things that make your baby move more. You should do a fetal movement count:  When your baby is normally most active.  At the same time each day.  A good time to count movements is while you are resting, after having something to eat and drink. How do I count fetal movements? 1. Find a quiet, comfortable area. Sit, or lie down on your side. 2. Write down the date, the start time and stop time, and the number of movements that you felt between those two times. Take this information with you to your health care visits. 3. For 2 hours, count kicks, flutters, swishes, rolls, and jabs. You should feel at least 10 movements during 2 hours. 4. You may stop counting after you have felt 10 movements. 5. If you do not feel 10 movements in 2 hours, have something to eat and drink. Then, keep resting and counting for 1 hour. If you feel at least 4 movements during that hour, you may stop counting. Contact a health care provider if:  You feel fewer than 4 movements in 2 hours.  Your  baby is not moving like he or she usually does. Date: ____________ Start time: ____________ Stop time: ____________ Movements: ____________ Date: ____________ Start time: ____________ Stop time: ____________ Movements: ____________ Date: ____________ Start time: ____________ Stop time: ____________ Movements: ____________ Date: ____________ Start time: ____________ Stop time: ____________ Movements: ____________ Date: ____________ Start time: ____________ Stop time: ____________ Movements: ____________ Date: ____________ Start time: ____________ Stop time: ____________ Movements: ____________ Date: ____________ Start time: ____________ Stop time: ____________ Movements: ____________ Date: ____________ Start time: ____________ Stop time: ____________ Movements: ____________ Date: ____________ Start time: ____________ Stop time: ____________ Movements: ____________ This information is not intended to replace advice given to you by your health care provider. Make sure you discuss any questions you have with your health care provider. Document Released: 01/03/2007 Document Revised: 08/02/2016 Document Reviewed: 01/13/2016 Elsevier Interactive Karen Education  [employer].

## 2017-08-09 NOTE — Progress Notes (Signed)
Karen Burnett is a 20 y.o. G1P0000 at [redacted]w[redacted]d here for routine follow up.  She reports:   -Anxiey: She works at the [employer] as a Scientist, physiological and does note that this is a source of stress for her, but states that she has been able to manage her anxiety.   -Fetus is moving less: she states that she still feels him move every day and during a day of work might feel him kick as many as 15 times in an hour. The decrease in movement is based on the patient's comparison to earlier in gestation.   -Low Back Pain: This has been an ongoing problem for the patient, which she reports experiencing since 10. Now when she sits down she occasionally experiences shooting pain in her back. She denies any weakness or changes in sensation in her legs, changes in urinary or bowel function or tingling or lack of sensation in her genital region.   See flow sheet for details.  A/P: Pregnancy at [redacted]w[redacted]d. Doing well.   Pregnancy issues include  -Anxiety: work is source of stress but she is coping well, does not want to pursue any treatment during pregnancy   -Decreased Fetal Movement: fetus still moving every day and by patient report still seems quite active. The patient was provided with information of kick counting and precautions to go to the [facility] if she counts less than 10 kicks in a two hour period while sitting quietly   -Low back Pain: The patient already has a maternity belt, which has not improved the pain. The pain does not limit her activity. She was counseled to manage pain with Tylenol as needed and attempt to adjust sitting positions that agravate these symptoms.    Infant feeding choice: breast Contraception choice: Nexplanon  [facility]: [facility], at [facility]   GBS and gc/chlamydia testing results were not reviewed today.  Testing was performed during today's visit  Labor and fetal movement precautions reviewed. Follow up 1 week.

## 2017-08-10 LAB — CBC WITH DIFFERENTIAL/PLATELET
BASOS: 0 %
Basophils Absolute: 0 10*3/uL (ref 0.0–0.2)
EOS (ABSOLUTE): 0 10*3/uL (ref 0.0–0.4)
Eos: 0 %
Hematocrit: 35.8 % (ref 34.0–46.6)
Hemoglobin: 12.4 g/dL (ref 11.1–15.9)
IMMATURE GRANS (ABS): 0 10*3/uL (ref 0.0–0.1)
Immature Granulocytes: 0 %
LYMPHS ABS: 1.4 10*3/uL (ref 0.7–3.1)
LYMPHS: 16 %
MCH: 27.9 pg (ref 26.6–33.0)
MCHC: 34.6 g/dL (ref 31.5–35.7)
MCV: 80 fL (ref 79–97)
MONOS ABS: 0.3 10*3/uL (ref 0.1–0.9)
Monocytes: 3 %
NEUTROS ABS: 7.2 10*3/uL — AB (ref 1.4–7.0)
Neutrophils: 81 %
Platelets: 279 10*3/uL (ref 150–379)
RBC: 4.45 x10E6/uL (ref 3.77–5.28)
RDW: 14.5 % (ref 12.3–15.4)
WBC: 9 10*3/uL (ref 3.4–10.8)

## 2017-08-10 LAB — RPR: RPR Ser Ql: NONREACTIVE

## 2017-08-10 LAB — HIV ANTIBODY (ROUTINE TESTING W REFLEX): HIV SCREEN 4TH GENERATION: NONREACTIVE

## 2017-08-10 LAB — CERVICOVAGINAL ANCILLARY ONLY
Chlamydia: NEGATIVE
NEISSERIA GONORRHEA: NEGATIVE

## 2017-08-13 ENCOUNTER — Ambulatory Visit (HOSPITAL_COMMUNITY): Payer: Commercial Managed Care - PPO

## 2017-08-13 ENCOUNTER — Other Ambulatory Visit: Payer: Self-pay | Admitting: Family Medicine

## 2017-08-13 ENCOUNTER — Ambulatory Visit (HOSPITAL_COMMUNITY)
Admission: RE | Admit: 2017-08-13 | Discharge: 2017-08-13 | Disposition: A | Discharge status: Home / Self Care | Payer: Commercial Managed Care - PPO | Source: Ambulatory Visit | Attending: Family Medicine | Admitting: Family Medicine

## 2017-08-13 DIAGNOSIS — Z3A39 39 weeks gestation of pregnancy: Secondary | ICD-10-CM | POA: Insufficient documentation

## 2017-08-13 DIAGNOSIS — O26843 Uterine size-date discrepancy, third trimester: Secondary | ICD-10-CM

## 2017-08-13 DIAGNOSIS — Z362 Encounter for other antenatal screening follow-up: Secondary | ICD-10-CM

## 2017-08-13 LAB — CULTURE, BETA STREP (GROUP B ONLY): Strep Gp B Culture: NEGATIVE

## 2017-08-14 ENCOUNTER — Ambulatory Visit (INDEPENDENT_AMBULATORY_CARE_PROVIDER_SITE_OTHER): Payer: Commercial Managed Care - PPO | Admitting: Internal Medicine

## 2017-08-14 VITALS — BP 124/70 | HR 72 | Temp 98.5°F | Wt 187.0 lb

## 2017-08-14 DIAGNOSIS — Z3403 Encounter for supervision of normal first pregnancy, third trimester: Secondary | ICD-10-CM

## 2017-08-14 NOTE — Progress Notes (Addendum)
Karen Burnett is a 21 y.o. G1P0000 at [redacted]w[redacted]d here for routine follow up.  She reports good fetal movement and some pelvic pressure. No vaginal bleeding, no contractions, no leakage of fluids. See flow sheet for details.  A/P: Pregnancy at [redacted]w[redacted]d. Doing well.   Pregnancy issues include: 1. Size-date discrepancy- measuring 34.5cm today at [redacted]w[redacted]d. Had growth US performed 8/27- fetal growth 54th percentile and normal AFI. 2. Subjectively fewer fetal movements noted at previous visit. Patient given information for kick counts and states that the kick counts have been completely normal. 3. Early [place] with A1 mild dilation of the fetal left renal pelvis, resolved on subsequent [place]. 4. Generalized anxiety disorder- stable, managed without medications.  Infant feeding choice: Breast Contraception choice: Nexplanon Infant circumcision desired: yes- [facility]  GBS and gc/chlamydia testing results were reviewed today.   Labor and fetal movement precautions reviewed. BPP/NST scheduled for 9/4 at 7:40am. Will need induction scheduled at next visit if still pregnant Follow up 1 week.

## 2017-08-14 NOTE — Patient Instructions (Signed)
It was so nice to meet you!  We have set up fetal monitoring and ultrasound for you on Tuesday, September 4th. You will need to have this done twice next week if you are still pregnant.  Please come back to see [place] in 1 week. We will schedule your induction at that visit.  -Dr. Nancy Marus

## 2017-08-17 ENCOUNTER — Encounter (HOSPITAL_COMMUNITY): Payer: Self-pay | Admitting: *Deleted

## 2017-08-17 ENCOUNTER — Inpatient Hospital Stay (HOSPITAL_COMMUNITY)
Admission: AD | Admit: 2017-08-17 | Discharge: 2017-08-18 | Disposition: A | Discharge status: Home / Self Care | Payer: Commercial Managed Care - PPO | Source: Ambulatory Visit | Attending: Obstetrics & Gynecology | Admitting: Obstetrics & Gynecology

## 2017-08-17 DIAGNOSIS — O471 False labor at or after 37 completed weeks of gestation: Secondary | ICD-10-CM

## 2017-08-17 NOTE — MAU Note (Signed)
Pt presents to MAU c/o ctx pt has not been timing them. Pt states she was checked on Wednesday and she was closed. Pt states she started bleeding today around 1330. Pt states she has only wore one pad since then and it just had a scant amt of blood on the pad. Pt states when she urinates she sees drops of blood in the toilet that range from bright red to dark red. Pt reports good fetal movement. Pt reports no LOF.

## 2017-08-18 ENCOUNTER — Inpatient Hospital Stay (HOSPITAL_COMMUNITY)
Admission: AD | Admit: 2017-08-18 | Discharge: 2017-08-21 | DRG: 775 | Disposition: A | Discharge status: Home / Self Care | Payer: Commercial Managed Care - PPO | Source: Ambulatory Visit | Attending: Obstetrics & Gynecology | Admitting: Obstetrics & Gynecology

## 2017-08-18 ENCOUNTER — Encounter (HOSPITAL_COMMUNITY): Payer: Self-pay

## 2017-08-18 DIAGNOSIS — Z880 Allergy status to penicillin: Secondary | ICD-10-CM | POA: Diagnosis not present

## 2017-08-18 DIAGNOSIS — Z3A39 39 weeks gestation of pregnancy: Secondary | ICD-10-CM

## 2017-08-18 DIAGNOSIS — O26893 Other specified pregnancy related conditions, third trimester: Secondary | ICD-10-CM | POA: Diagnosis present

## 2017-08-18 LAB — CBC
HEMATOCRIT: 36.8 % (ref 36.0–46.0)
Hemoglobin: 12.4 g/dL (ref 12.0–15.0)
MCH: 27.7 pg (ref 26.0–34.0)
MCHC: 33.7 g/dL (ref 30.0–36.0)
MCV: 82.3 fL (ref 78.0–100.0)
Platelets: 293 10*3/uL (ref 150–400)
RBC: 4.47 MIL/uL (ref 3.87–5.11)
RDW: 14.5 % (ref 11.5–15.5)
WBC: 14.2 10*3/uL — ABNORMAL HIGH (ref 4.0–10.5)

## 2017-08-18 LAB — POCT FERN TEST: POCT FERN TEST: POSITIVE

## 2017-08-18 MED ORDER — FENTANYL CITRATE (PF) 100 MCG/2ML IJ SOLN: 100.0000 ug | INTRAMUSCULAR | Status: DC | PRN

## 2017-08-18 MED ORDER — ACETAMINOPHEN 325 MG PO TABS
650.0000 mg | ORAL_TABLET | ORAL | Status: DC | PRN
  Administered 2017-08-19: 650 mg via ORAL
  Filled 2017-08-18: qty 2

## 2017-08-18 MED ORDER — LACTATED RINGERS IV SOLN
INTRAVENOUS | Status: DC
  Administered 2017-08-18 – 2017-08-19 (×3): via INTRAVENOUS

## 2017-08-18 MED ORDER — OXYTOCIN 40 UNITS IN LACTATED RINGERS INFUSION - SIMPLE MED
2.5000 [IU]/h | INTRAVENOUS | Status: DC
  Filled 2017-08-18: qty 1000

## 2017-08-18 MED ORDER — OXYCODONE-ACETAMINOPHEN 5-325 MG PO TABS: 2.0000 | ORAL_TABLET | ORAL | Status: DC | PRN

## 2017-08-18 MED ORDER — LIDOCAINE HCL (PF) 1 % IJ SOLN
30.0000 mL | INTRAMUSCULAR | Status: DC | PRN
  Filled 2017-08-18: qty 30

## 2017-08-18 MED ORDER — FLEET ENEMA 7-19 GM/118ML RE ENEM: 1.0000 | ENEMA | RECTAL | Status: DC | PRN

## 2017-08-18 MED ORDER — OXYCODONE-ACETAMINOPHEN 5-325 MG PO TABS
1.0000 | ORAL_TABLET | ORAL | Status: DC | PRN
Start: 2017-08-18 — End: 2017-08-19

## 2017-08-18 MED ORDER — SOD CITRATE-CITRIC ACID 500-334 MG/5ML PO SOLN: 30.0000 mL | ORAL | Status: DC | PRN

## 2017-08-18 MED ORDER — ONDANSETRON HCL 4 MG/2ML IJ SOLN: 4.0000 mg | Freq: Four times a day (QID) | INTRAMUSCULAR | Status: DC | PRN

## 2017-08-18 MED ORDER — LACTATED RINGERS IV SOLN
500.0000 mL | INTRAVENOUS | Status: DC | PRN
  Administered 2017-08-19: 500 mL via INTRAVENOUS

## 2017-08-18 MED ORDER — OXYTOCIN BOLUS FROM INFUSION: 500.0000 mL | Freq: Once | INTRAVENOUS | Status: DC

## 2017-08-18 NOTE — Discharge Instructions (Signed)

## 2017-08-18 NOTE — MAU Note (Signed)
I have communicated with Cam HaiKimberly Shaw CNM, and reviewed vital signs:  Vitals:   08/17/17 2324  BP: 122/75  Pulse: 66  Resp: 17  Temp: 98.4 F (36.9 C)    Vaginal exam:  Dilation: 1.5 Effacement (%): 50 Cervical Position: Middle Station: -3 Presentation: Vertex Exam by:: Joyelle Siedlecki fields rn ,   Also reviewed contraction pattern and that non-stress test is reactive.  It has been documented that patient is contracting every 1-5 minutes with minimal cervical change over 1 hour not indicating active labor.  Patient states she had some bleeding when she went to the bathroom yet according to her it was very minimal and sounded like it was bloody show, provider was notified of this situation and stated it is normal for her to have bloody show and that if she had bleeding that was heavy like a period that is when she would need to return to MAU.  Based on this report provider has given order for discharge.  A discharge order and diagnosis entered by a provider.   Labor discharge instructions reviewed with patient.

## 2017-08-18 NOTE — MAU Note (Signed)
Contractions every 5 min now. Blood tinge mucus yesterday. Leaking fluid little bits thru out day, started at 10:30 am, watery clear. Was 1.5 cm yesterday. Baby moving well.

## 2017-08-18 NOTE — H&P (Signed)
LABOR AND DELIVERY ADMISSION HISTORY AND PHYSICAL NOTE  Karen Burnett is a 21 y.o. female G1P0000 with IUP at 4562w5d by 11-wk U/S presenting for SROM and contractions. She reports SROM around 10 am; reports clear fluid. She reports contractions since than which have been getting closer together and stronger.   She reports positive fetal movement.  Prenatal History/Complications: Clinic  [facility] Prenatal Labs  Dating  early ultrasound Blood type:   O pos  Genetic Screen 1 Screen: too late   Quad screen WNL Antibody:  Neg  Anatomic US  normal (initial with left renal pelvis dilation, resolved on subsequent ultrasound) Rubella:   Immune  GTT Early:  Not indicated          Third trimester: WNL RPR: NON REAC (11/03 1504)   Flu vaccine  08/09/17 HBsAg: NEGATIVE (11/03 1504)   TDaP vaccine   Received 05/24/17           Rhogam: not indicated HIV: NONREACTIVE (11/03 1504)   Baby Food   breast                                            GBS: (For PCN allergy, check sensitivities)  Contraception  nexplanon Pap: ASCUS 10/2016, HPV not detected. Routine screening in 1 year  [facility]  [facility]   Pediatrician  [facility] Dr. Kennedy BuckerGrant   Support Person  Edwena Feltyameron Ray     Past Medical History: Past Medical History:  Diagnosis Date  . Anxiety   . Ultrasound recheck of fetal pyelectasis, antepartum 05/24/2017  . Vaginal Pap smear, abnormal 10/2016   ASCUS, HPV neg so needs routine screen    Past Surgical History: Past Surgical History:  Procedure Laterality Date  . NO PAST SURGERIES      Obstetrical History: OB History    Gravida Para Term Preterm AB Living   1 0 0 0 0 0   SAB TAB Ectopic Multiple Live Births   0 0 0 0        Social History: Social History   Social History  . Marital status: Single    Spouse name: N/A  . Number of children: N/A  . Years of education: N/A   Social History Main Topics  . Smoking status: Never Smoker  . Smokeless tobacco: Never Used  . Alcohol use No  .  Drug use: No  . Sexual activity: Yes     Comment: ist intercourse- 20, partners- 1   Other Topics Concern  . None   Social History Narrative   [place]    Hobbies: sleep, soccer. Student at [employer]     Family History: Family History  Problem Relation Age of Onset  . Thyroid disease Mother   . Diabetes Father   . Arthritis Father   . Psoriasis Father   . Psoriasis Brother   . Diabetes Maternal Aunt   . Diabetes Maternal Uncle   . Diabetes Paternal Aunt   . Diabetes Paternal Uncle   . Diabetes Maternal Grandmother   . Diabetes Maternal Grandfather   . Hypertension Maternal Grandfather   . Diabetes Paternal Grandmother   . Diabetes Paternal Grandfather     Allergies: No Known Allergies  Prescriptions Prior to Admission  Medication Sig Dispense Refill Last Dose  . Prenatal MV-Min-FA-Omega-3 (PRENATAL GUMMIES/DHA & FA) 0.4-32.5 MG CHEW Chew 1 each by mouth daily.  60 tablet 3 08/18/2017 at Unknown time     Review of Systems  All systems reviewed and negative except as stated in HPI  Physical Exam Blood pressure 118/79, pulse 74, temperature 98.6 F (37 C), temperature source Oral, resp. rate 16, height 5\' 2"  (1.575 m), weight 189 lb 8 oz (86 kg), last menstrual period 11/09/2016, SpO2 100 %. General appearance: alert and cooperative Lungs: clear to auscultation bilaterally Heart: regular rate and rhythm Abdomen: soft, non-tender; bowel sounds normal Extremities: No calf swelling or tenderness Presentation: cephalic per RN exam Fetal monitoring: baseline rate 145, moderate variability, +acel, no decel Uterine activity: ctx q3 min, regular Dilation: 3 Effacement (%): 80 Station: -2 Exam by:: Dennis Bast   Prenatal labs: ABO, Rh: O/Positive/-- (05/03 0944) Antibody: Negative (05/03 0944) Rubella: !Error! RPR: Non Reactive (08/23 1102)  HBsAg: Negative (05/03 0944)  HIV: NONREACTIVE (11/03 1504)  GBS:  negative 1 hr Glucola: 123 Genetic screening:   Normal quad screen Anatomy US: initial with left renal pelvis dilation, resolved on f/u ultrasound  Prenatal Transfer Tool  Maternal Diabetes: No Genetic Screening: Normal Maternal Ultrasounds/Referrals: as above Fetal Ultrasounds or other Referrals:  None Maternal Substance Abuse:  No Significant Maternal Medications:  None Significant Maternal Lab Results: None  Results for orders placed or performed during the hospital encounter of 08/18/17 (from the past 24 hour(s))  Fern Test   Collection Time: 08/18/17 11:16 PM  Result Value Ref Range   POCT Fern Test Positive = ruptured amniotic membanes     Assessment: Karen Burnett is a 21 y.o. G1P0000 at [redacted]w[redacted]d here for ROM at 10am. Now having contractions. SVE in MAU 2.5cm/80%, repeat now after ~2 hours 3cm/80%  #Labor: Start IV Pitocin to augment labor given that she is 14 hours s/p ROM #Pain: Patient wants to get epidural #FWB: Cat I #ID:  GBS neg #MOF: breast #MOC: Nexplanon #Circ:  outpatient  Karen Burnett 08/19/2017, 2:41 AM

## 2017-08-19 ENCOUNTER — Encounter (HOSPITAL_COMMUNITY): Payer: Self-pay | Admitting: Family Medicine

## 2017-08-19 ENCOUNTER — Inpatient Hospital Stay (HOSPITAL_COMMUNITY): Payer: Commercial Managed Care - PPO | Admitting: Anesthesiology

## 2017-08-19 DIAGNOSIS — Z3A39 39 weeks gestation of pregnancy: Secondary | ICD-10-CM

## 2017-08-19 LAB — TYPE AND SCREEN
ABO/RH(D): O POS
Antibody Screen: NEGATIVE

## 2017-08-19 LAB — ABO/RH: ABO/RH(D): O POS

## 2017-08-19 LAB — RPR: RPR Ser Ql: NONREACTIVE

## 2017-08-19 MED ORDER — TERBUTALINE SULFATE 1 MG/ML IJ SOLN
0.2500 mg | Freq: Once | INTRAMUSCULAR | Status: DC | PRN
  Filled 2017-08-19: qty 1

## 2017-08-19 MED ORDER — DIBUCAINE 1 % RE OINT: 1.0000 "application " | TOPICAL_OINTMENT | RECTAL | Status: DC | PRN

## 2017-08-19 MED ORDER — OXYTOCIN 40 UNITS IN LACTATED RINGERS INFUSION - SIMPLE MED
1.0000 m[IU]/min | INTRAVENOUS | Status: DC
  Administered 2017-08-19: 2 m[IU]/min via INTRAVENOUS
  Administered 2017-08-19: 1 m[IU]/min via INTRAVENOUS

## 2017-08-19 MED ORDER — PHENYLEPHRINE 40 MCG/ML (10ML) SYRINGE FOR IV PUSH (FOR BLOOD PRESSURE SUPPORT)
80.0000 ug | PREFILLED_SYRINGE | INTRAVENOUS | Status: DC | PRN
  Filled 2017-08-19: qty 5
  Filled 2017-08-19: qty 10

## 2017-08-19 MED ORDER — PHENYLEPHRINE 40 MCG/ML (10ML) SYRINGE FOR IV PUSH (FOR BLOOD PRESSURE SUPPORT)
80.0000 ug | PREFILLED_SYRINGE | INTRAVENOUS | Status: DC | PRN
  Filled 2017-08-19: qty 5

## 2017-08-19 MED ORDER — PRENATAL MULTIVITAMIN CH
1.0000 | ORAL_TABLET | Freq: Every day | ORAL | Status: DC
  Administered 2017-08-20 – 2017-08-21 (×2): 1 via ORAL
  Filled 2017-08-19 (×2): qty 1

## 2017-08-19 MED ORDER — DIPHENHYDRAMINE HCL 25 MG PO CAPS: 25.0000 mg | ORAL_CAPSULE | Freq: Four times a day (QID) | ORAL | Status: DC | PRN

## 2017-08-19 MED ORDER — DIPHENHYDRAMINE HCL 50 MG/ML IJ SOLN: 12.5000 mg | INTRAMUSCULAR | Status: DC | PRN

## 2017-08-19 MED ORDER — FENTANYL 2.5 MCG/ML BUPIVACAINE 1/10 % EPIDURAL INFUSION (WH - ANES)
14.0000 mL/h | INTRAMUSCULAR | Status: DC | PRN
  Administered 2017-08-19 (×2): 14 mL/h via EPIDURAL
  Filled 2017-08-19 (×2): qty 100

## 2017-08-19 MED ORDER — EPHEDRINE 5 MG/ML INJ
10.0000 mg | INTRAVENOUS | Status: DC | PRN
  Filled 2017-08-19: qty 2

## 2017-08-19 MED ORDER — WITCH HAZEL-GLYCERIN EX PADS: 1.0000 "application " | MEDICATED_PAD | CUTANEOUS | Status: DC | PRN

## 2017-08-19 MED ORDER — COCONUT OIL OIL
1.0000 "application " | TOPICAL_OIL | Status: DC | PRN
  Administered 2017-08-20: 1 via TOPICAL
  Filled 2017-08-19: qty 120

## 2017-08-19 MED ORDER — ACETAMINOPHEN 325 MG PO TABS
650.0000 mg | ORAL_TABLET | ORAL | Status: DC | PRN
  Administered 2017-08-19: 650 mg via ORAL
  Filled 2017-08-19: qty 2

## 2017-08-19 MED ORDER — ONDANSETRON HCL 4 MG/2ML IJ SOLN: 4.0000 mg | INTRAMUSCULAR | Status: DC | PRN

## 2017-08-19 MED ORDER — SODIUM CHLORIDE 0.9 % IV SOLN
2.0000 g | Freq: Once | INTRAVENOUS | Status: AC
  Administered 2017-08-19: 2 g via INTRAVENOUS
  Filled 2017-08-19: qty 2000

## 2017-08-19 MED ORDER — LACTATED RINGERS IV SOLN
500.0000 mL | Freq: Once | INTRAVENOUS | Status: AC
  Administered 2017-08-19: 500 mL via INTRAVENOUS

## 2017-08-19 MED ORDER — ZOLPIDEM TARTRATE 5 MG PO TABS: 5.0000 mg | ORAL_TABLET | Freq: Every evening | ORAL | Status: DC | PRN

## 2017-08-19 MED ORDER — ONDANSETRON HCL 4 MG PO TABS: 4.0000 mg | ORAL_TABLET | ORAL | Status: DC | PRN

## 2017-08-19 MED ORDER — IBUPROFEN 600 MG PO TABS
600.0000 mg | ORAL_TABLET | Freq: Four times a day (QID) | ORAL | Status: DC
  Administered 2017-08-19 – 2017-08-21 (×8): 600 mg via ORAL
  Filled 2017-08-19 (×8): qty 1

## 2017-08-19 MED ORDER — TETANUS-DIPHTH-ACELL PERTUSSIS 5-2.5-18.5 LF-MCG/0.5 IM SUSP: 0.5000 mL | Freq: Once | INTRAMUSCULAR | Status: DC

## 2017-08-19 MED ORDER — SIMETHICONE 80 MG PO CHEW: 80.0000 mg | CHEWABLE_TABLET | ORAL | Status: DC | PRN

## 2017-08-19 MED ORDER — GENTAMICIN SULFATE 40 MG/ML IJ SOLN
150.0000 mg | Freq: Three times a day (TID) | INTRAVENOUS | Status: DC
  Administered 2017-08-19: 150 mg via INTRAVENOUS
  Filled 2017-08-19 (×2): qty 3.75

## 2017-08-19 MED ORDER — BENZOCAINE-MENTHOL 20-0.5 % EX AERO
1.0000 "application " | INHALATION_SPRAY | CUTANEOUS | Status: DC | PRN
  Administered 2017-08-19: 1 via TOPICAL
  Filled 2017-08-19: qty 56

## 2017-08-19 MED ORDER — SENNOSIDES-DOCUSATE SODIUM 8.6-50 MG PO TABS
2.0000 | ORAL_TABLET | ORAL | Status: DC
  Administered 2017-08-19 – 2017-08-20 (×2): 2 via ORAL
  Filled 2017-08-19 (×2): qty 2

## 2017-08-19 MED ORDER — SODIUM CHLORIDE 0.9 % IV SOLN
1.0000 g | INTRAVENOUS | Status: DC
  Filled 2017-08-19 (×3): qty 1000

## 2017-08-19 MED ORDER — LIDOCAINE HCL (PF) 1 % IJ SOLN
INTRAMUSCULAR | Status: DC | PRN
  Administered 2017-08-19 (×2): 5 mL via EPIDURAL

## 2017-08-19 NOTE — Progress Notes (Signed)
Labor Progress Note  [name] is a 21 y.o. female G1P0000 with IUP at [redacted]w[redacted]d admitted for SROM and contractions.  S: Patient seen & examined for progress of labor. Patient comfortable with epidural. No concerns at this time.   O: BP (!) 117/52   Pulse 76   Temp 99 F (37.2 C) (Oral)   Resp 20   Ht 5\' 2"  (1.575 m)   Wt 86 kg (189 lb 8 oz)   LMP 11/09/2016 (Within Weeks)   SpO2 100%   BMI 34.66 kg/m   FHT: 150bpm, mod var, +accels, prolonged decels TOCO: q2-684min, patient looks comfortable during contractions  CVE: Dilation: 5.5 Effacement (%): 90 Cervical Position: Posterior Station: -1 Presentation: Vertex Exam by:: Karen Burnett, rnc   A&P: 21 y.o. G1P0000 4634w6d here for SROM and contractions.  Labor:pit was recently stopped due to prolonged deceleration, nurse is working on repositioning with pt, will try restarting low dose pit later Fetal Wellbeing:Cat II Pain Control:  Epidural Anticipated MOD:SVD  Continue expectant management   Obie Dredgeelly TWeledji Medical Student 08/19/2017 7:01 AM

## 2017-08-19 NOTE — Progress Notes (Signed)
ANTIBIOTIC CONSULT NOTE - INITIAL  Pharmacy Consult for Gentamicin Indication: Triple I  No Known Allergies  Patient Measurements: Height: 5\' 2"  (157.5 cm) Weight: 189 lb 8 oz (86 kg) IBW/kg (Calculated) : 50.1 Adjusted Body Weight: 60.8kg  Vital Signs: Temp: 101 F (38.3 C) (09/02 1030) Temp Source: Oral (09/02 1030) BP: 105/86 (09/02 1030) Pulse Rate: 83 (09/02 1030)  Labs:  Recent Labs  08/18/17 2335  WBC 14.2*  HGB 12.4  PLT 293   No results for input(s): GENTTROUGH, GENTPEAK, GENTRANDOM in the last 72 hours.   Microbiology: Recent Results (from the past 720 hour(s))  Culture, beta strep (group b only)     Status: None   Collection Time: 08/09/17 11:19 AM  Result Value Ref Range Status   Strep Gp B Culture Negative Negative Final    Comment: Centers for Disease Control and Prevention (CDC) and American Congress of Obstetricians and Gynecologists (ACOG) guidelines for prevention of perinatal group B streptococcal (GBS) disease specify co-collection of a vaginal and rectal swab specimen to maximize sensitivity of GBS detection. Per the CDC and ACOG, swabbing both the lower vagina and rectum substantially increases the yield of detection compared with sampling the vagina alone. Penicillin G, ampicillin, or cefazolin are indicated for intrapartum prophylaxis of perinatal GBS colonization. Reflex susceptibility testing should be performed prior to use of clindamycin only on GBS isolates from penicillin-allergic women who are considered a high risk for anaphylaxis. Treatment with vancomycin without additional testing is warranted if resistance to clindamycin is noted.     Medications:  Ampicillin 2 grams x 1 then 1 gram IV every 4 hours.  Assessment: 21 y.o. female G1P0000 at 223w6d with maternal fever Estimated Ke = 0.347, Vd = 0.38L/kg  Goal of Therapy:  Gentamicin peak 6-8 mg/L and Trough < 1 mg/L  Plan:  Gentamicin 150 mg IV every 8 hrs  Check Scr  with next labs if gentamicin continued >24 hours PP Will check gentamicin levels if continued > 72hr or clinically indicated.  Berlin HunMendenhall, Cinthya Bors D 08/19/2017,10:56 AM

## 2017-08-19 NOTE — Anesthesia Pain Management Evaluation Note (Signed)
  CRNA Pain Management Visit Note  Patient: Karen Burnett, 21 y.o., female  "Hello I am a [facility] of the anesthesia [facility] at [facility]. We have an anesthesia team available at all times to provide care throughout the hospital, including epidural management and anesthesia for C-section. I don't know your plan for the delivery whether it a natural birth, water birth, IV sedation, nitrous supplementation, doula or epidural, but we want to meet your pain goals."   1.Was your pain managed to your expectations on prior hospitalizations?   Yes   2.What is your expectation for pain management during this hospitalization?     Epidural  3.How can we help you reach that goal? epidural  Record the patient's initial score and the patient's pain goal.   Pain: 0  Pain Goal: 5 [facility] wants you to be able to say your pain was always managed very well.  Carrah Eppolito 08/19/2017

## 2017-08-19 NOTE — Anesthesia Postprocedure Evaluation (Signed)
Anesthesia Post Note  Patient: Medical laboratory scientific officerClarisa Burnett  Procedure(s) Performed: * No procedures listed *     Patient location during evaluation: Mother Baby Anesthesia Type: Epidural Level of consciousness: awake and alert and oriented Pain management: pain level controlled Vital Signs Assessment: post-procedure vital signs reviewed and stable Respiratory status: spontaneous breathing and nonlabored ventilation Cardiovascular status: stable Postop Assessment: no headache, patient able to bend at knees, no backache, no signs of nausea or vomiting, epidural receding and adequate PO intake Anesthetic complications: no    Last Vitals:  Vitals:   08/19/17 1323 08/19/17 1416  BP: 126/62 127/68  Pulse: 71 (!) 101  Resp: 18 20  Temp: 36.8 C 36.9 C  SpO2: 99% 99%    Last Pain:  Vitals:   08/19/17 1416  TempSrc: Axillary  PainSc:    Pain Goal: Patients Stated Pain Goal: 0 (08/18/17 2258)               [name]

## 2017-08-19 NOTE — Anesthesia Preprocedure Evaluation (Signed)

## 2017-08-19 NOTE — Progress Notes (Signed)
Labor Progress Note  [name] is a 21 y.o. female G1P0000 with IUP at 398w5d admitted for SROM and contractions.  S: Patient seen & examined for progress of labor. Patient comfortable with epidural. No concerns at this time.   O: BP 115/71   Pulse 88   Temp (!) 101 F (38.3 C) (Oral)   Resp 18   Ht 5\' 2"  (1.575 m)   Wt 86 kg (189 lb 8 oz)   LMP 11/09/2016 (Within Weeks)   SpO2 100%   BMI 34.66 kg/m   FHT: 150bpm, mod var, +accels, no decels TOCO: q2-594min, patient looks comfortable during contractions  CVE: Dilation: 10 Dilation Complete Date: 08/19/17 Dilation Complete Time: 1114 Effacement (%): 100 Cervical Position: Middle Station: +1, +2 Presentation: Vertex Exam by:: Lajuana Matteina Jacobs, RN  A&P: 21 y.o. G1P0000 3573w6d here for SROM and contractions.  Labor:pit was restarted with better fetal tolerance, patient is now complete Fetal Wellbeing:Cat I Pain Control:  Epidural Anticipated MOD:SVD  Continue expectant management  Nechemia Chiappetta C. Frances FurbishWinfrey, MD PGY-1, [facility] 08/19/2017 11:26 AM

## 2017-08-19 NOTE — Anesthesia Procedure Notes (Signed)
Epidural Patient location during procedure: OB Start time: 08/19/2017 2:00 AM End time: 08/19/2017 2:17 AM  Staffing Anesthesiologist: Farris Geiman  Preanesthetic Checklist Completed: patient identified, site marked, surgical consent, pre-op evaluation, timeout performed, IV checked, risks and benefits discussed and monitors and equipment checked  Epidural Patient position: sitting Prep: site prepped and draped and DuraPrep Patient monitoring: continuous pulse ox and blood pressure Approach: midline Location: L4-L5 Injection technique: LOR air  Needle:  Needle type: Tuohy  Needle gauge: 17 G Needle length: 9 cm and 9 Needle insertion depth: 5 cm Catheter type: closed end flexible Catheter size: 19 Gauge Catheter at skin depth: 10 cm Test dose: negative  Assessment Events: blood not aspirated, injection not painful, no injection resistance, negative IV test and no paresthesia

## 2017-08-20 LAB — CBC
HCT: 34.1 % — ABNORMAL LOW (ref 36.0–46.0)
Hemoglobin: 11.6 g/dL — ABNORMAL LOW (ref 12.0–15.0)
MCH: 28.3 pg (ref 26.0–34.0)
MCHC: 34 g/dL (ref 30.0–36.0)
MCV: 83.2 fL (ref 78.0–100.0)
Platelets: 236 10*3/uL (ref 150–400)
RBC: 4.1 MIL/uL (ref 3.87–5.11)
RDW: 15.1 % (ref 11.5–15.5)
WBC: 11.8 10*3/uL — ABNORMAL HIGH (ref 4.0–10.5)

## 2017-08-20 NOTE — Progress Notes (Signed)
Post Partum Day 1 Subjective: no complaints, up ad lib, voiding and tolerating PO  Objective: Blood pressure 102/60, pulse 69, temperature 98 F (36.7 C), temperature source Oral, resp. rate 16, height 5\' 2"  (1.575 m), weight 189 lb 8 oz (86 kg), last menstrual period 11/09/2016, SpO2 99 %, unknown if currently breastfeeding.  Physical Exam:  General: alert, cooperative, appears stated age and no distress Lochia: appropriate Uterine Fundus: firm Incision: n/a DVT Evaluation: No evidence of DVT seen on physical exam.   Recent Labs  08/18/17 2335 08/20/17 0358  HGB 12.4 11.6*  HCT 36.8 34.1*    Assessment/Plan: Plan for discharge tomorrow   LOS: 2 days   Wyvonnia DuskyMarie Ennio Houp 08/20/2017, 6:36 AM

## 2017-08-20 NOTE — Lactation Note (Signed)
This note was copied from a baby's chart. Lactation Consultation Note New mom having difficulty latching. Mom has flat nipples cone shaped breast. #16 NS. Latched to breast, finally started suckling. Alimentum inserted into NS stimulated baby into suckling. Baby BF well at intervals. RN started Into DEBP.breast heavy, hand express thick drop of colostrum. Has shells, encouraged to wear them between feedings. Mom encouraged to feed baby 8-12 times/24 hours and with feeding cues. WH/LC brochure given w/resources, support groups and LC services.  Encouraged to wake baby if hasn't cued in 3 hrs. Alert RN if cont. To have no interest in feeding.  Patient Name: Karen Eustace MooreClarisa Petrik WRUEA'VToday's Date: 08/20/2017 Reason for consult: Initial assessment   Maternal Data    Feeding Feeding Type: Breast Fed Length of feed: 10 min  LATCH Score Latch: Repeated attempts needed to sustain latch, nipple held in mouth throughout feeding, stimulation needed to elicit sucking reflex.  Audible Swallowing: None  Type of Nipple: Flat  Comfort (Breast/Nipple): Soft / non-tender  Hold (Positioning): Full assist, staff holds infant at breast  LATCH Score: 4  Interventions Interventions: Breast feeding basics reviewed;Support pillows;Assisted with latch;Position options;Skin to skin;Breast massage;Hand express;Shells;Pre-pump if needed;Reverse pressure;Breast compression;Adjust position;DEBP  Lactation Tools Discussed/Used Tools: Shells;Nipple Shields Nipple shield size: 16 Shell Type: Inverted   Consult Status Consult Status: Follow-up Date: 08/20/17 Follow-up type: In-patient    Charyl DancerCARVER, Leyton Magoon G 08/20/2017, 4:45 AM

## 2017-08-20 NOTE — Progress Notes (Signed)
CSW received consult for hx of Anxiety.  CSW met with MOB to offer support and complete assessment.    When CSW arrived, MOB was resting and infant was asleep in bassinet. CSW inquired about MOB's anxiety hx and MOB acknowledged a hx of anxiety since pregnancy.  MOB attributed her sym[ptoms fot pregnancy and denied the use of medications. MOB reported MOB's have been managed by talking about her feelings with family, friends, and co-workers. MOB reports a wealth of supports and MOB appeared to have insight and awareness about her mental health.  MOB did not present with any acute signs and symptoms and denied HI and SI.    CSW provided education regarding the baby blues period vs. perinatal mood disorders, discussed treatment and gave resources for mental health follow up if concerns arise.  CSW recommends self-evaluation during the postpartum time period using the New Mom Checklist from Postpartum Progress and encouraged MOB to contact a medical professional if symptoms are noted at any time.      CSW provided review of Sudden Infant Death Syndrome (SIDS) precautions.   CSW identifies no further need for intervention and no barriers to discharge at this time.    Isys Tietje Boyd-Gilyard, MSW, LCSW Clinical Social Work [phone] 

## 2017-08-21 ENCOUNTER — Other Ambulatory Visit: Payer: Commercial Managed Care - PPO

## 2017-08-21 ENCOUNTER — Encounter: Payer: Commercial Managed Care - PPO | Admitting: Internal Medicine

## 2017-08-21 MED ORDER — IBUPROFEN 600 MG PO TABS: 600.0000 mg | ORAL_TABLET | Freq: Four times a day (QID) | ORAL | 0 refills | Status: DC

## 2017-08-21 MED ORDER — SENNOSIDES-DOCUSATE SODIUM 8.6-50 MG PO TABS: 2.0000 | ORAL_TABLET | ORAL | 0 refills | Status: DC

## 2017-08-21 NOTE — Discharge Instructions (Signed)

## 2017-08-21 NOTE — Lactation Note (Signed)
This note was copied from a baby's chart. Lactation Consultation Note  Patient Name: Karen Burnett ZOXWR'UToday's Date: 08/21/2017 Reason for consult: Follow-up assessment   Baby 45 hours old.  Mother states she would like to give her baby breastmilk but has not had time to pump. She states he has had difficulty latching.  Offered assistance with latching and demonstrated how to prepump w/ manual pump. Suggest mother call if she would like assistnce. Unsure if mother will breastfeed or pump. Discussed frequency of stimulating breast to establish milk supply. Mom encouraged to feed baby 8-12 times/24 hours and with feeding cues.  Reviewed engorgement care and monitoring voids/stools.    Maternal Data    Feeding    LATCH Score                   Interventions    Lactation Tools Discussed/Used     Consult Status Consult Status: Complete    [name], Ruth Boschen 08/21/2017, 9:51 AM

## 2017-08-21 NOTE — Discharge Summary (Signed)
OB Discharge Summary     Patient Name: Karen Burnett DOB: 01-04-1996 MRN: [id number]  Date of admission: 08/18/2017 Delivering MD: Wyvonnia Dusky D   Date of discharge: 08/21/2017  Admitting diagnosis: 40 WKS, CTXS Intrauterine pregnancy: [redacted]w[redacted]d     Secondary diagnosis:  Active Problems:   Indication for care in labor or delivery   SVD (spontaneous vaginal delivery)  Additional problems:  Patient Active Problem List   Diagnosis Date Noted  . SVD (spontaneous vaginal delivery) 08/19/2017  . Indication for care in labor or delivery 08/18/2017  . Irregular periods/menstrual cycles 03/09/2017  . Supervision of normal first pregnancy 01/17/2017  . Generalized anxiety disorder 01/02/2017  . Family history of diabetes mellitus 10/01/2015  . Low back pain 04/16/2015      Discharge diagnosis: Term Pregnancy Delivered                                                                                                Post partum procedures:None  Augmentation: Pitocin  Complications: None  Hospital course:  Onset of Labor With Vaginal Delivery     21 y.o. yo G1P1001 at [redacted]w[redacted]d was admitted in Active Labor on 08/18/2017. Patient had an uncomplicated labor course as follows:  Membrane Rupture Time/Date: 10:30 AM ,08/18/2017   Intrapartum Procedures: Episiotomy: None [1]                                         Lacerations:  None [1]  Patient had a delivery of a Viable infant. 08/19/2017  Information for the patient's newborn:  Oriya, [place] [[id number]]  Delivery Method: Vaginal, Spontaneous Delivery (Filed from Delivery Summary)    Pateint had an uncomplicated postpartum course.  She is ambulating, tolerating a regular diet, passing flatus, and urinating well. Patient is discharged home in stable condition on 08/21/17.   Physical exam  Vitals:   08/19/17 1819 08/20/17 0010 08/20/17 0435 08/20/17 1900  BP: 114/60 (!) 111/58 102/60 103/66  Pulse: 85 65 69 70  Resp: 18 18 16 18   Temp:  98.4 F (36.9 C) 98.3 F (36.8 C) 98 F (36.7 C) 98.1 F (36.7 C)  TempSrc: Axillary Oral Oral Oral  SpO2: 99% 99% 99% 99%  Weight:      Height:       General: alert, cooperative and no distress Lochia: appropriate Uterine Fundus: firm DVT Evaluation: No evidence of DVT seen on physical exam.  Labs: Lab Results  Component Value Date   WBC 11.8 (H) 08/20/2017   HGB 11.6 (L) 08/20/2017   HCT 34.1 (L) 08/20/2017   MCV 83.2 08/20/2017   PLT 236 08/20/2017   No flowsheet data found.  Discharge instruction: per After Visit Summary and "Baby and Me Booklet".  After visit meds:  Allergies as of 08/21/2017   No Known Allergies     Medication List    TAKE these medications   ibuprofen 600 MG tablet Commonly known as:  ADVIL,MOTRIN Take 1 tablet (600 mg total) by mouth every 6 (six)  hours.   PRENATAL GUMMIES/DHA & FA 0.4-32.5 MG Chew Chew 1 each by mouth daily.   senna-docusate 8.6-50 MG tablet Commonly known as:  Senokot-S Take 2 tablets by mouth daily.            Discharge Care Instructions        Start     Ordered   08/22/17 0000  senna-docusate (SENOKOT-S) 8.6-50 MG tablet  Every 24 hours     08/21/17 0537   08/21/17 0000  ibuprofen (ADVIL,MOTRIN) 600 MG tablet  Every 6 hours     08/21/17 0537      Diet: routine diet  Activity: Advance as tolerated. Pelvic rest for 6 weeks.   Outpatient follow up:6 weeks Follow up Appt:  Follow-up Information    [facility]. Schedule an appointment as soon as possible for a visit.   Why:  Please make an appointment for postpartum visit for 4-6 weeks Contact information: [address] [place] [id number] [phone]         Postpartum contraception: Nexplanon  Newborn Data: Live born female  Birth Weight: 7 lb (3175 g) APGAR: 8, 9  Baby Feeding: Bottle and Breast Disposition:home with mother   Caryl AdaJazma Ronda Rajkumar, DO OB Fellow Faculty Practice, [facility] 08/21/2017, 5:40 AM

## 2017-08-23 ENCOUNTER — Ambulatory Visit (HOSPITAL_COMMUNITY): Payer: Commercial Managed Care - PPO

## 2017-09-04 ENCOUNTER — Telehealth: Payer: Self-pay

## 2017-09-04 NOTE — Telephone Encounter (Signed)
Patient called to schedule an appointment for a 6 week postpartum.  She also wants birth control now.Karen Burnett

## 2017-09-05 ENCOUNTER — Telehealth: Payer: Self-pay | Admitting: Internal Medicine

## 2017-09-05 NOTE — Telephone Encounter (Signed)
Returned patient's call to discuss birth control. Left voicemail stating she should call our office back to discuss further.  Willadean Carol, MD

## 2017-09-07 NOTE — Telephone Encounter (Signed)
Pt  Returned dr Maricela Curet call

## 2017-09-07 NOTE — Telephone Encounter (Signed)
Will forward to MD. Delrae Hagey,CMA  

## 2017-09-07 NOTE — Telephone Encounter (Signed)
Called patient back. She just wanted an appointment scheduled for Nexplanon placement. Appointment scheduled for 9/24.

## 2017-09-10 ENCOUNTER — Ambulatory Visit (INDEPENDENT_AMBULATORY_CARE_PROVIDER_SITE_OTHER): Payer: Commercial Managed Care - PPO | Admitting: Internal Medicine

## 2017-09-10 VITALS — BP 110/62 | HR 95 | Temp 98.3°F | Ht 62.0 in | Wt 171.0 lb

## 2017-09-10 DIAGNOSIS — Z30019 Encounter for initial prescription of contraceptives, unspecified: Secondary | ICD-10-CM | POA: Diagnosis not present

## 2017-09-10 DIAGNOSIS — Z30017 Encounter for initial prescription of implantable subdermal contraceptive: Secondary | ICD-10-CM

## 2017-09-10 DIAGNOSIS — Z3046 Encounter for surveillance of implantable subdermal contraceptive: Secondary | ICD-10-CM

## 2017-09-10 LAB — POCT URINE PREGNANCY: PREG TEST UR: NEGATIVE

## 2017-09-10 MED ORDER — ETONOGESTREL 68 MG ~~LOC~~ IMPL
68.0000 mg | DRUG_IMPLANT | Freq: Once | SUBCUTANEOUS | Status: AC
  Administered 2017-09-10: 68 mg via SUBCUTANEOUS

## 2017-09-10 NOTE — Patient Instructions (Signed)
Nexplanon Instructions After Insertion   Keep bandage clean and dry for 24 hours   May use ice/Tylenol/Ibuprofen for soreness or pain   If you develop fever, drainage or increased warmth from incision site-contact office immediately  Congratulations again on your beautiful son =)  -Dr. Nancy Marus

## 2017-09-10 NOTE — Assessment & Plan Note (Signed)
Nexplanon placed today without any difficulty. See procedure note below. - Return precautions discussed - Follow-up if any issues arise.

## 2017-09-10 NOTE — Progress Notes (Signed)
   [facility] Phone: [phone]  Subjective:  Karen Burnett is a 21 year old female presenting to clinic for Nexplanon placement. She just recently had her son on 9/1. She is currently breastfeeding. She has not had sexual intercourse since the birth of her son. No current fevers or chills.  ROS: See HPI for pertinent positives and negatives  Past Medical History- GAD, recently had a baby  Family history reviewed for today's visit. No changes.  Social history- patient is a never smoker  Objective: BP 110/62   Pulse 95   Temp 98.3 F (36.8 C) (Oral)   Ht  (1.575 m)   Wt 171 lb (77.6 kg)   SpO2 98%   BMI 31.28 kg/m  Gen: NAD, alert, cooperative with exam Msk: Left arm normal in appearance without any swelling or erythema.  Assessment/Plan: Nexplanon Placement: Nexplanon placed today without any difficulty. See procedure note below. - Return precautions discussed - Follow-up if any issues arise.   PROCEDURE NOTE: Nexplanon insertion Patient given informed consent, signed copy in the chart.  Appropriate time out taken  Pregnancy test was negative.  The patient's left arm was prepped and draped in the usual sterile fashion.. The ruler used to measure and mark the insertion area 8 cm from medial epicondyle of the elbow. Local anaesthesia obtained using 1.5 cc of 1% lidocaine with epinephrine. Nexplanon was inserted per manufacturer's directions. Less than 1 cc blood loss. The insertion site covered with antibiotic ointment and a pressure bandage to minimize bruising. There were no complications and the patient tolerated the procedure well.  Device information was given in handout form. Patient is informed the removal date will be in three years and package insert card filled out and given to her.   Willadean Carol, MD PGY-3

## 2017-09-10 NOTE — Addendum Note (Signed)
Addended by: Jone Baseman D on: 09/10/2017 05:27 PM   Modules accepted: Orders

## 2017-09-24 ENCOUNTER — Ambulatory Visit: Payer: Commercial Managed Care - PPO | Admitting: Internal Medicine

## 2017-10-15 ENCOUNTER — Ambulatory Visit (INDEPENDENT_AMBULATORY_CARE_PROVIDER_SITE_OTHER): Payer: Commercial Managed Care - PPO | Admitting: Internal Medicine

## 2017-10-15 DIAGNOSIS — F411 Generalized anxiety disorder: Secondary | ICD-10-CM

## 2017-10-15 DIAGNOSIS — Z23 Encounter for immunization: Secondary | ICD-10-CM | POA: Diagnosis not present

## 2017-10-15 MED ORDER — SERTRALINE HCL 25 MG PO TABS: 25.0000 mg | ORAL_TABLET | Freq: Every day | ORAL | 0 refills | Status: DC

## 2017-10-15 NOTE — Patient Instructions (Signed)
It was so nice to see you!  I have started you on Zoloft. Please take 1 tablet daily.   We will see you back in 1 month for follow-up of your anxiety.  -Dr. Nancy MarusMayo

## 2017-10-15 NOTE — Progress Notes (Signed)
   [facility] Phone: [phone]  Subjective:  Rudene ChristiansClarisa is a [age] year old female presenting to clinic for a postpartum visit and to discuss anxiety. Postpartum visit: patient is 8 weeks postpartum following a vaginal delivery. -Breastfeeding: Going well -Contraception: Nexplanon placed 09/10/17 -Bleeding: None -Bonding with infant: Going well -Sexual activity: Has resumed- no dyspareunia -Mood: no feelings of sadness, but she is having anxiety (see below).  Anxiety: Has been worsening over the last few weeks. Thinks her anxiety is worsen because she is stressed about money and work. She notes irritability. She states she is not stressed out by her baby, but seems to be stressed by everything else. She is having occasional panic attacks where she "feels like she is falling". She hyperventilates and feels like she is in a tight space. She also has trouble seeing and her body feels numb. She just tries to breathe through these. Before she was pregnant, she had panic attacks 4-5 times per year. She did not have any panic attacks while she was pregnant. Her most recent panic attack was a few weeks ago. She has been on Celexa in the past, but she stopped this when she found out she was pregnant.  ROS: See HPI for pertinent positives and negatives  Past Medical History- generalized anxiety disorder  Family history reviewed for today's visit. No changes.  Social history- patient is a never smoker  Objective: BP 106/62   Pulse 74   Temp 98.5 F (36.9 C)   Ht 5\' 2"  (1.575 m)   Wt 171 lb (77.6 kg)   SpO2 98%   BMI 31.28 kg/m  Gen: NAD, alert, cooperative with exam CV: RRR, no murmur Resp: CTABL, no wheezes, normal work of breathing GI: Soft, non-tender, unable to palpate uterus Msk: Nexplanon in place in left arm, no overlying erythema. Psych: Normal rate of speech, appropriate behavior, normal affect, normal thought content, normal judgement  GAD-7: 13 [place]  Postnatal Depression Scale: 7  Assessment/Plan: Anxiety: Uncontrolled. GAD-7 is a 13 today. Also having occasional panic attacks 4-5 times per year. Discussed therapy, but patient states she does not have the time for this, as she will be starting back at work soon. - Start Zoloft 25mg  daily - Could consider adding Hydroxyzine for panic attacks in the future - Follow-up in 4 weeks   Karen CarolKaty Cameshia Cressman, MD PGY-3

## 2017-10-16 NOTE — Assessment & Plan Note (Signed)
Uncontrolled. GAD-7 is a 13 today. Also having occasional panic attacks 4-5 times per year. Discussed therapy, but patient states she does not have the time for this, as she will be starting back at work soon. - Start Zoloft 25mg  daily - Could consider adding Hydroxyzine for panic attacks in the future - Follow-up in 4 weeks

## 2017-11-19 ENCOUNTER — Other Ambulatory Visit: Payer: Self-pay

## 2017-11-19 ENCOUNTER — Ambulatory Visit: Payer: Commercial Managed Care - PPO | Admitting: Internal Medicine

## 2017-11-19 ENCOUNTER — Encounter: Payer: Self-pay | Admitting: Internal Medicine

## 2017-11-19 DIAGNOSIS — F411 Generalized anxiety disorder: Secondary | ICD-10-CM | POA: Diagnosis not present

## 2017-11-19 MED ORDER — SERTRALINE HCL 25 MG PO TABS: 25.0000 mg | ORAL_TABLET | Freq: Every day | ORAL | 0 refills | Status: DC

## 2017-11-19 NOTE — Patient Instructions (Signed)
It was so nice to see you!  I'm so glad to hear that your anxiety is getting much better. We increased your Zoloft dose from 25mg  daily to 50mg  daily. I have still prescribed you 25mg  tablets, so take 2 tablets together for now.  Please give me a call when you run out of the medication and let me know how the increased dose is working for you!  -Dr. Nancy MarusMayo

## 2017-11-20 NOTE — Progress Notes (Signed)
   [facility] Phone: [phone]  Subjective:  Rudene ChristiansClarisa is a 21 year old female presenting to clinic for follow-up of anxiety. She was seen in clinic on 10/15/17 for worsening anxiety and occasional panic attacks. She was started on Zoloft 25mg  daily. She states she is feeling much better. She feels "like a normal person again". She also notices that she feels more irritable if she forgets to take the medication. She still has occasional feelings of anxiety right before she breastfeeds and for the first 5 minutes of breastfeeding, but the anxiety goes away on its own after a few minutes. No side effects to the Zoloft.   ROS: See HPI for pertinent positives and negatives  Past Medical History- generalized anxiety disorder, recent pregnancy  Family history reviewed for today's visit. No changes.  Social history- patient is a never smoker  Objective: BP (!) 96/58   Pulse 84   Temp 98.5 F (36.9 C) (Oral)   Wt 175 lb (79.4 kg)   SpO2 99%   BMI 32.01 kg/m  Gen: NAD, alert, cooperative with exam, well-appearing, smiling Psych: Normal rate of speech, bright affect, normal behavior, normal thought content.  GAD-7: 5  Assessment/Plan: Generalized Anxiety Disorder: Much improved. GAD-7 score has improved from 13 at last visit to 5 today. Still having anxiety surrounding breastfeeding. - Increase Zoloft from 25mg  daily to 50mg  daily - Patient will call me in 4 weeks to let me know how this is going - Again discussed referral to therapy, but patient unable to take time of work to do this. - Follow-up as needed   Willadean CarolKaty Wynton Hufstetler, MD PGY-3

## 2017-11-20 NOTE — Assessment & Plan Note (Signed)
Much improved. GAD-7 score has improved from 13 at last visit to 5 today. Still having anxiety surrounding breastfeeding. - Increase Zoloft from 25mg  daily to 50mg  daily - Patient will call me in 4 weeks to let me know how this is going - Again discussed referral to therapy, but patient unable to take time of work to do this. - Follow-up as needed

## 2018-01-21 ENCOUNTER — Other Ambulatory Visit: Payer: Self-pay | Admitting: Internal Medicine

## 2018-01-25 ENCOUNTER — Other Ambulatory Visit: Payer: Self-pay | Admitting: Internal Medicine

## 2018-02-12 ENCOUNTER — Other Ambulatory Visit: Payer: Self-pay

## 2018-02-12 ENCOUNTER — Ambulatory Visit (INDEPENDENT_AMBULATORY_CARE_PROVIDER_SITE_OTHER): Payer: Commercial Managed Care - PPO | Admitting: Internal Medicine

## 2018-02-12 DIAGNOSIS — F411 Generalized anxiety disorder: Secondary | ICD-10-CM

## 2018-02-12 MED ORDER — SERTRALINE HCL 25 MG PO TABS: 75.0000 mg | ORAL_TABLET | Freq: Every day | ORAL | 0 refills | Status: DC

## 2018-02-12 NOTE — Assessment & Plan Note (Signed)
Uncontrolled recently due to life stressors. GAD-7 was a 10 today. - Increase Zoloft from 50mg  daily to 75mg  daily - Patient will call with any side effects - Follow-up in 3 months
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
## 2018-02-12 NOTE — Patient Instructions (Signed)
It was so wonderful to see you!  Let's increase your Zoloft from 50mg  daily to 75mg  daily.  Give us a call when you need a refill!  We will see you back in 3 months or earlier if needed.  -Dr. Nancy MarusMayo

## 2018-02-14 IMAGING — US US OB COMP LESS 14 WK
1 series · 15 of 27 positions shown · non-contrast
Comparison: None.

CLINICAL DATA: Encounter for supervision of other normal pregnancy
in first trimester, pregnancy dating, uncertain LMP, irregular
cycles, cramping since December 2016

EXAM:
OBSTETRIC <14 WK ULTRASOUND
TECHNIQUE: Transabdominal ultrasound was performed for evaluation of the
gestation as well as the maternal uterus and adnexal regions.

[Series 1: us ob comp less 14 wk · 15 of 27 slices shown]
[im 1/27]
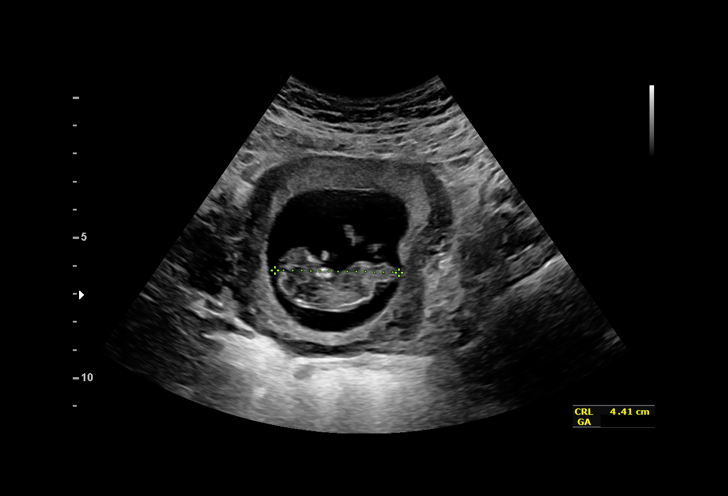
[im 3/27]
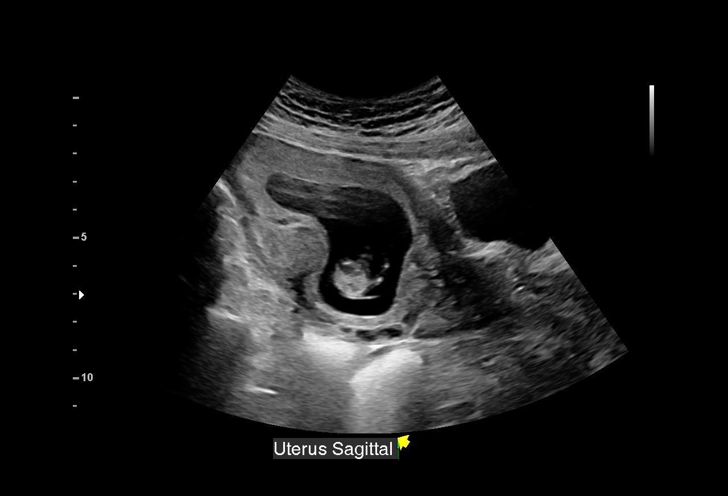
[im 5/27]
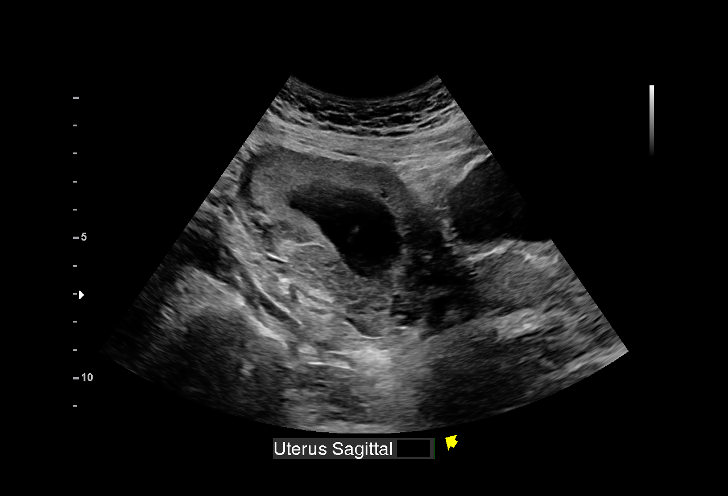
[im 7/27]
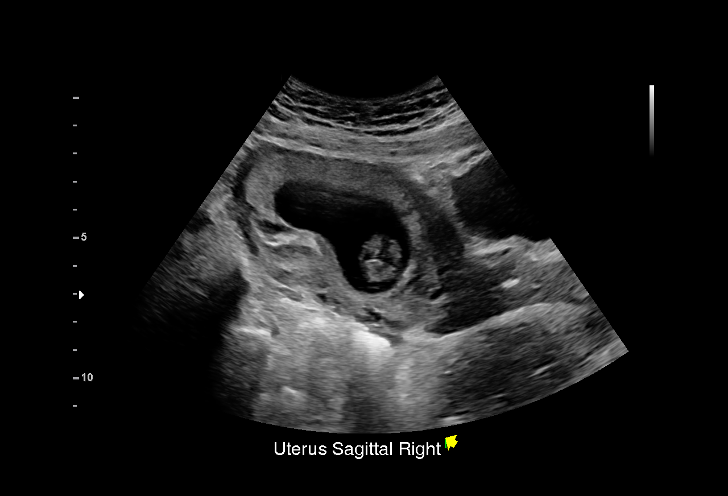
[im 9/27]
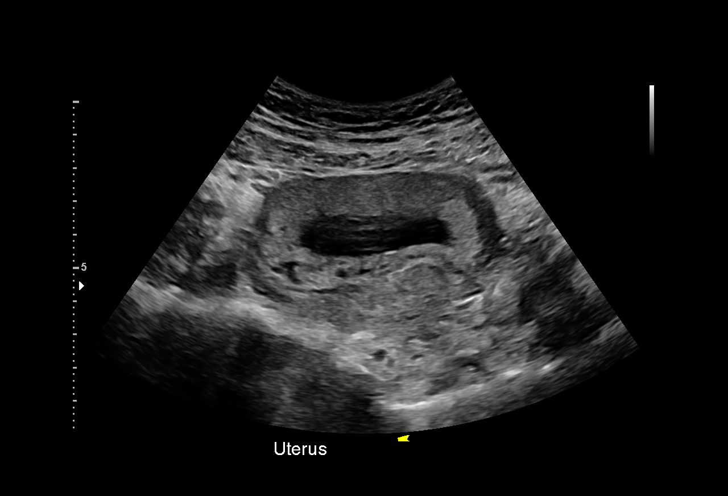
[im 10/27]
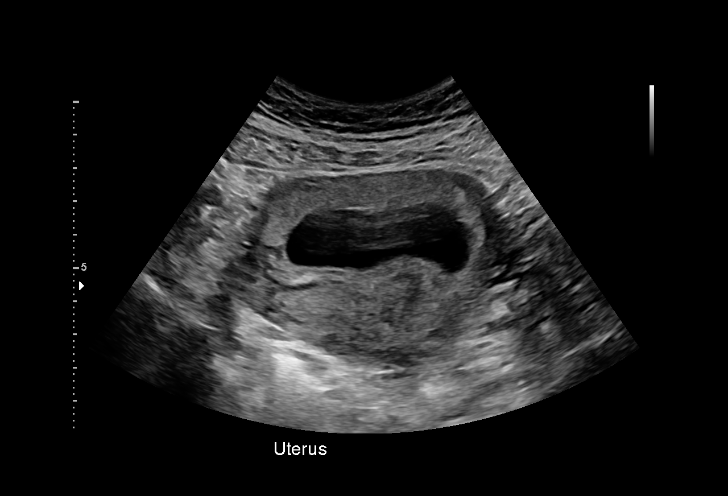
[im 12/27]
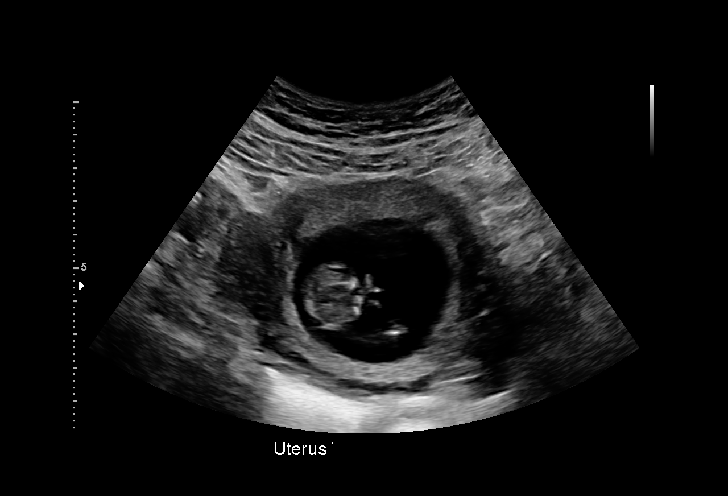
[im 14/27]
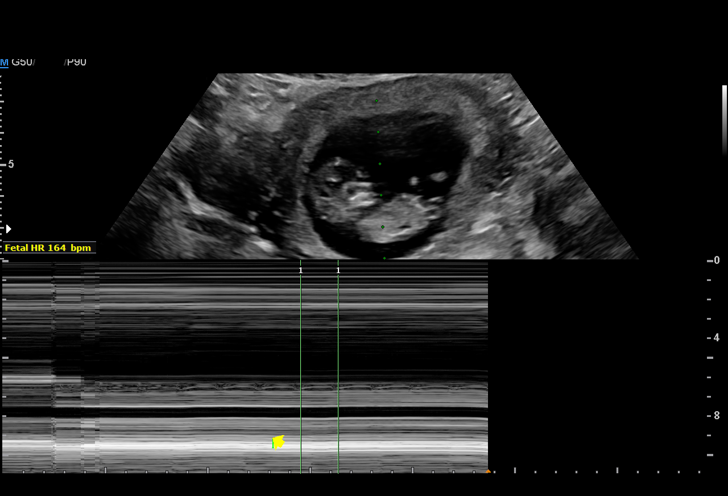
[im 16/27]
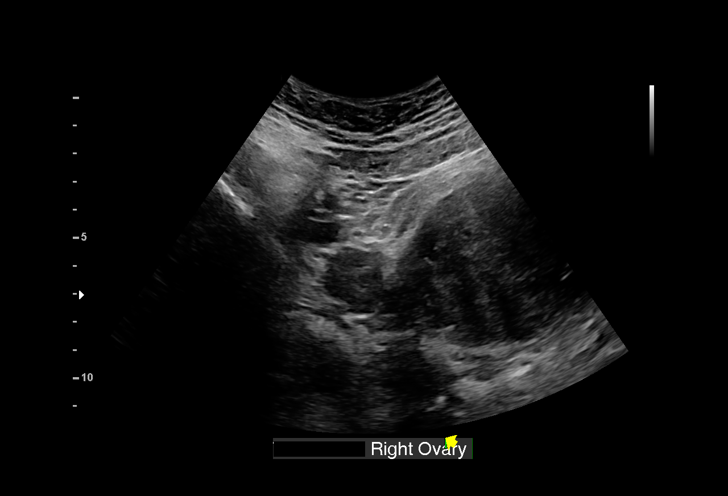
[im 18/27]
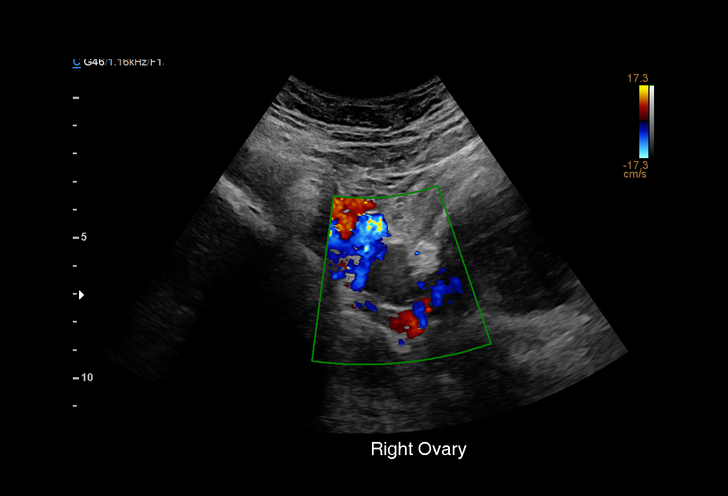
[im 19/27]
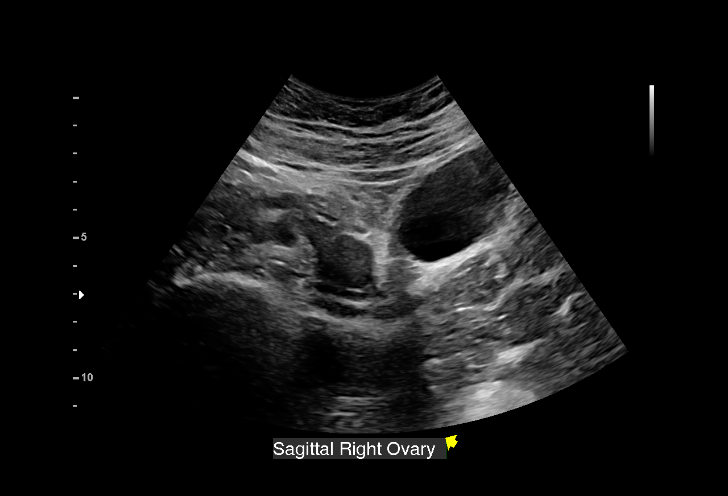
[im 21/27]
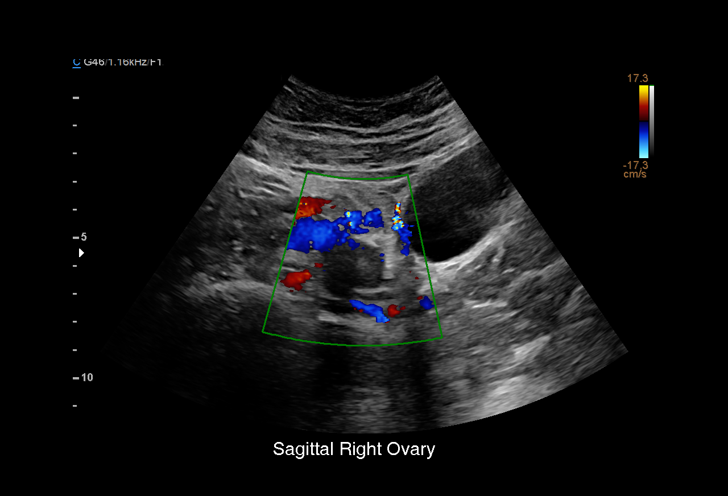
[im 23/27]
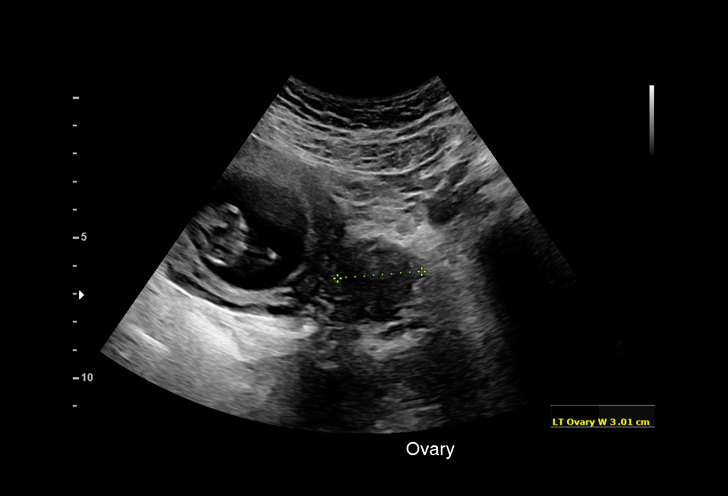
[im 25/27]
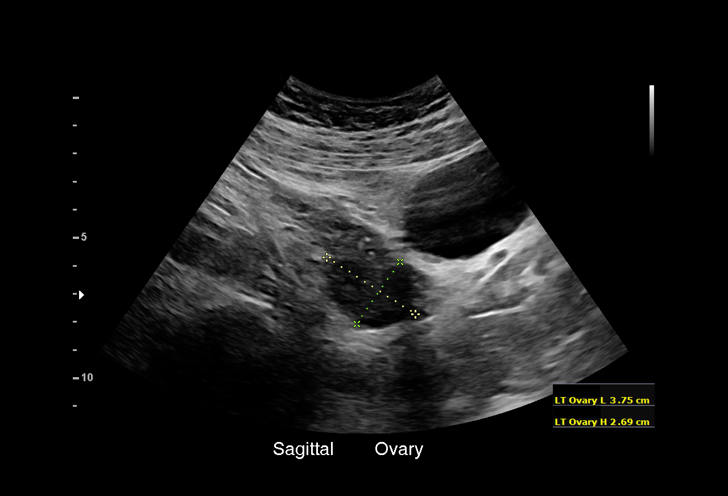
[im 27/27]
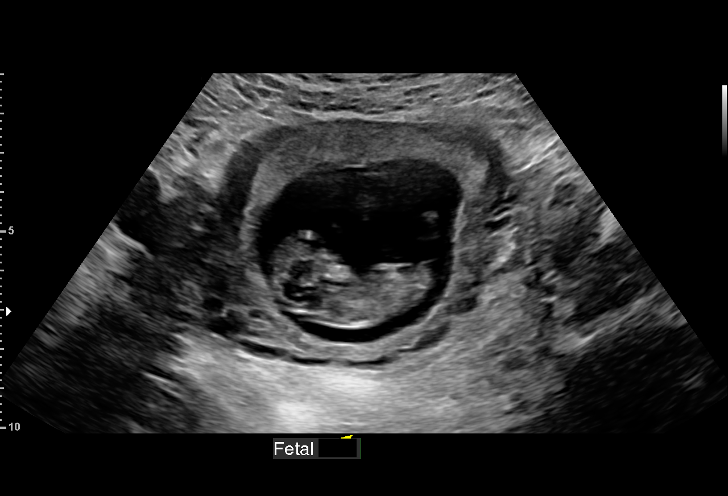

[15 of 27 positions shown; findings below may reference images not displayed]

FINDINGS: Intrauterine gestational sac: Present

Yolk sac:  Not visualized

Embryo:  Present

Cardiac Activity: Present

Heart Rate: 161 bpm

CRL:   45.0  mm   11 w 1 d                  US EDC: 08/20/2017

Subchorionic hemorrhage:  None visualized.

Maternal uterus/adnexae: Uterus otherwise unremarkable.

RIGHT ovary normal size and morphology, 2.4 x 3.4 x 2.1 cm.

LEFT ovary normal size and morphology, 3.0 x 3.8 x 2.7 cm.

No free pelvic fluid or adnexal masses.
IMPRESSION: Single live intrauterine gestation measured at 11 weeks 1 day EGA by
crown-rump length.

No acute abnormalities.

## 2018-02-22 ENCOUNTER — Other Ambulatory Visit: Payer: Self-pay | Admitting: *Deleted

## 2018-02-25 MED ORDER — SERTRALINE HCL 25 MG PO TABS: 75.0000 mg | ORAL_TABLET | Freq: Every day | ORAL | 0 refills | Status: DC

## 2018-04-01 ENCOUNTER — Encounter: Payer: Self-pay | Admitting: Internal Medicine

## 2018-04-03 ENCOUNTER — Other Ambulatory Visit: Payer: Self-pay | Admitting: Internal Medicine

## 2018-04-03 MED ORDER — SERTRALINE HCL 25 MG PO TABS
75.0000 mg | ORAL_TABLET | Freq: Every day | ORAL | 0 refills | Status: DC
Start: 2018-04-03 — End: 2018-04-23

## 2018-04-23 ENCOUNTER — Other Ambulatory Visit: Payer: Self-pay | Admitting: Internal Medicine

## 2018-05-08 IMAGING — US US MFM OB FOLLOW-UP
1 series · 14 of 28 positions shown · non-contrast
Comparison: none

[Series 1: us mfm ob follow-up · 67 acquisitions, 14 frames shown]
[im 3/67]
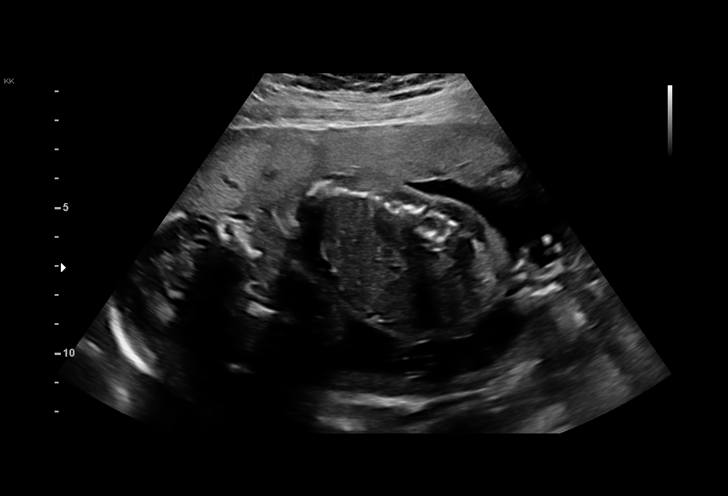
[im 8/67]
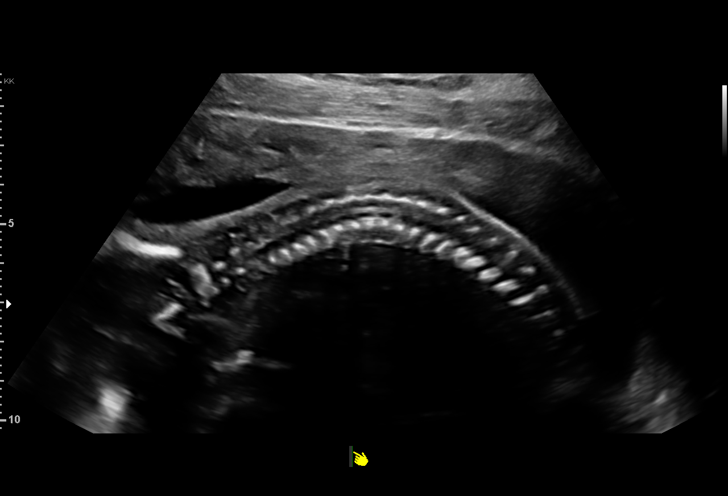
[im 13/67]
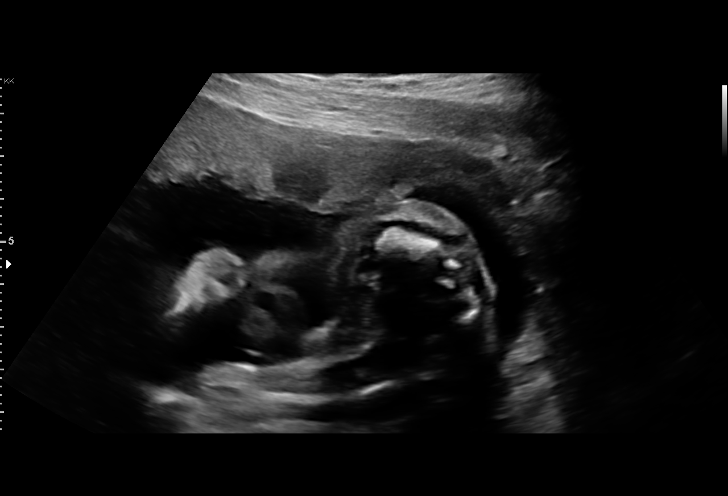
[im 18/67]
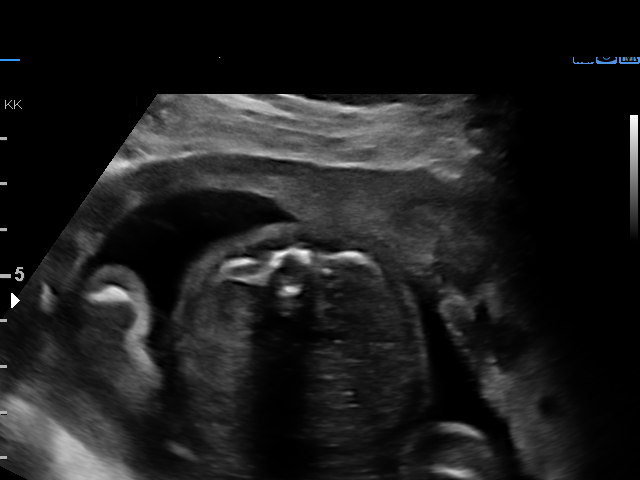
[im 23/67]
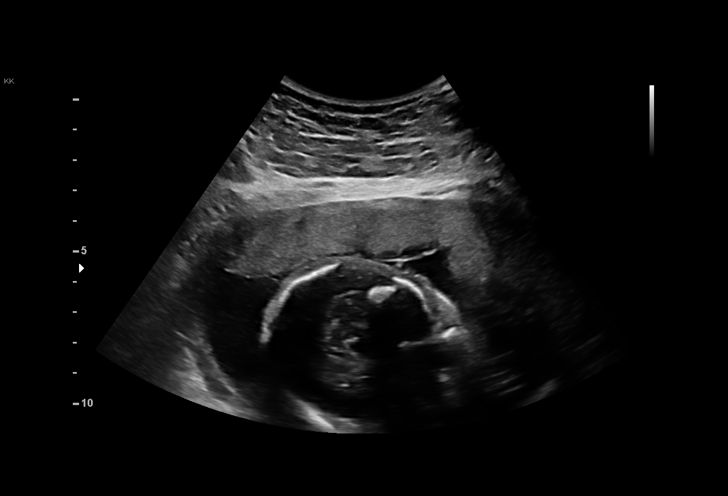
[im 27/67]
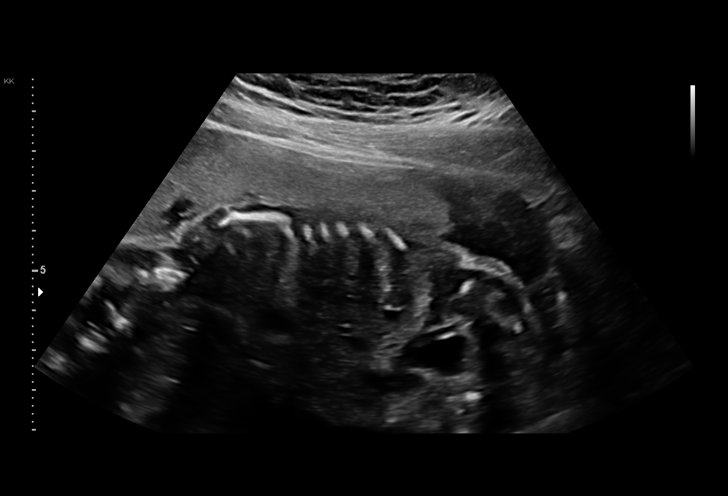
[im 32/67]
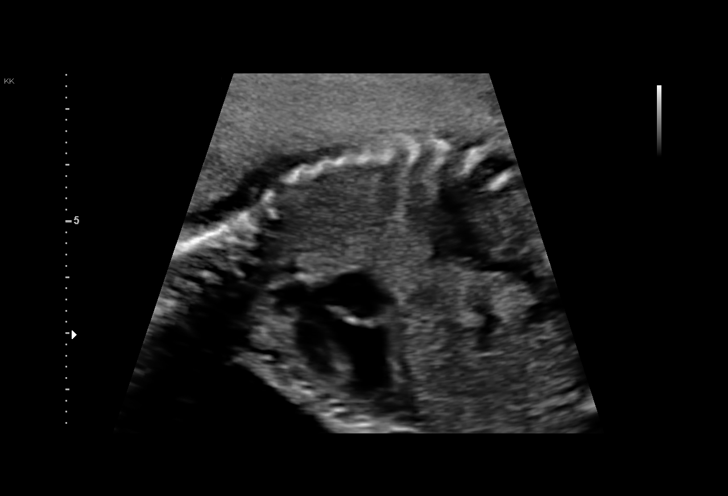
[im 37/67]
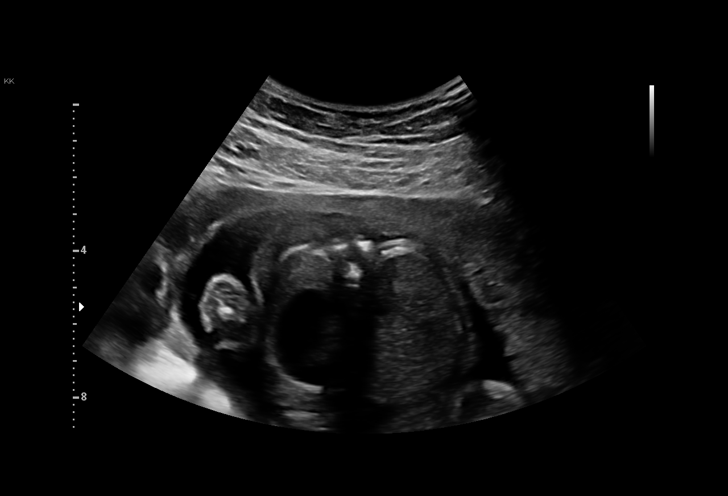
[im 42/67]
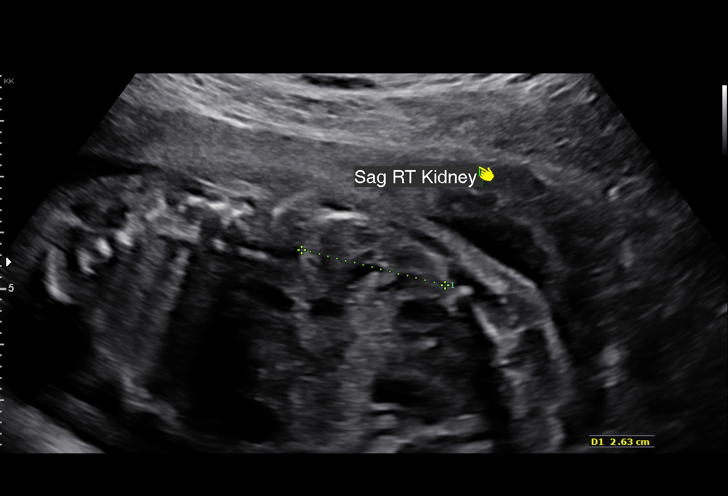
[im 47/67]
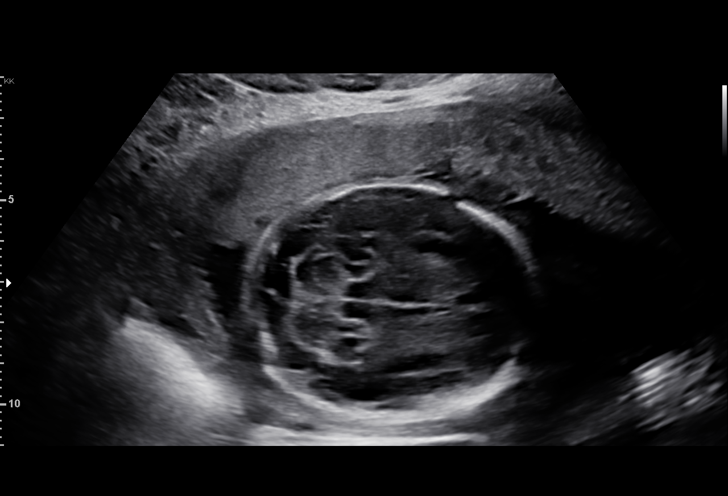
[im 52/67]
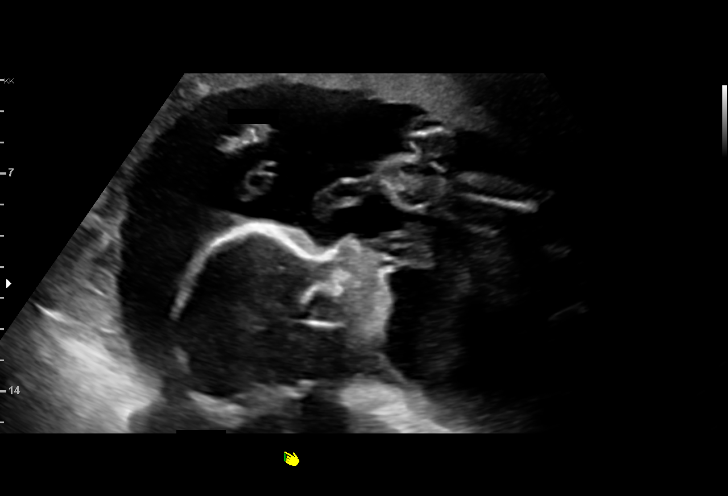
[im 57/67]
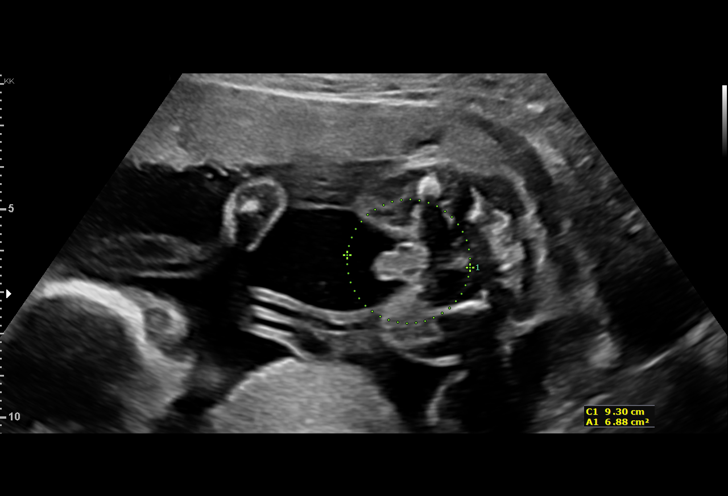
[im 62/67]
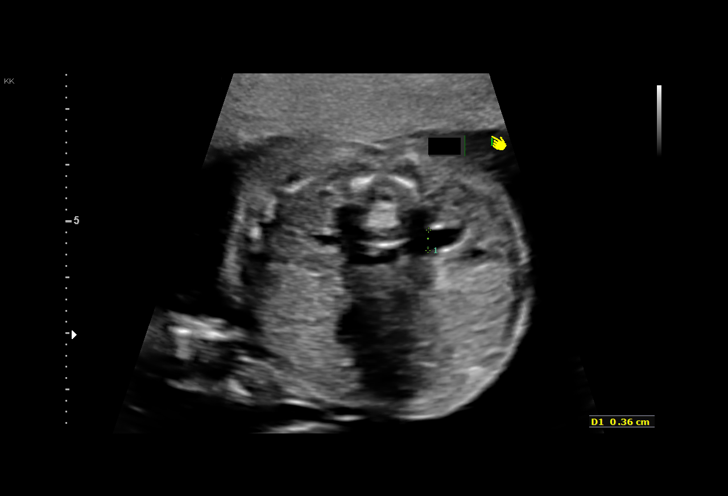
[im 67/67]
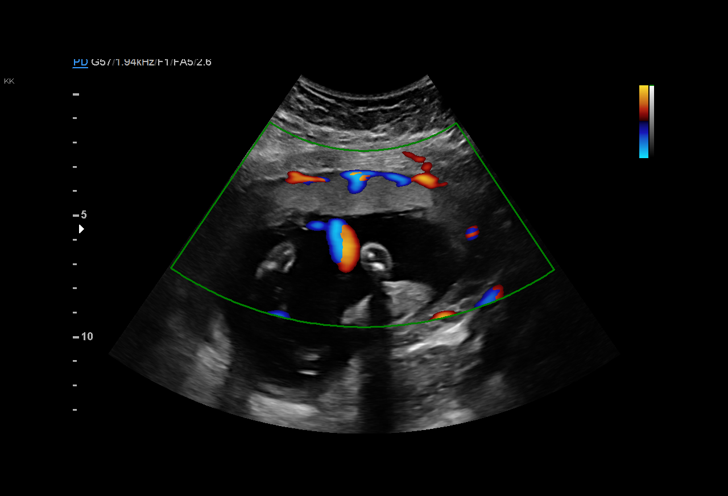

[14 of 28 positions shown; findings below may reference images not displayed]

Sreet

1  BOUSSAIRI                 [id number]      [id number]     [id number]
HENRII
Indications

23 weeks gestation of pregnancy
Encounter for other antenatal screening
follow-up
OB History

Blood Type:            Height:  5'2"   Weight (lb):  169       BMI:
Gravidity:    1         Term:   0        Prem:   0        SAB:   0
TOP:          0       Ectopic:  0        Living: 0
Fetal Evaluation

Num Of Fetuses:     1
Fetal Heart         155
Rate(bpm):
Cardiac Activity:   Observed
Presentation:       Transverse, head to maternal right
Placenta:           Anterior, above cervical os
P. Cord Insertion:  Visualized

Amniotic Fluid
AFI FV:      Subjectively within normal limits

Largest Pocket(cm)
5.0
Biometry

BPD:        57  mm     G. Age:  23w 3d         62  %    CI:         71.3   %    70 - 86
FL/HC:      18.3   %    19.2 -
HC:       215   mm     G. Age:  23w 4d         57  %    HC/AC:      1.14        1.05 -
AC:      188.6  mm     G. Age:  23w 5d         61  %    FL/BPD:     68.9   %    71 - 87
FL:       39.3  mm     G. Age:  22w 4d         27  %    FL/AC:      20.8   %    20 - 24

Est. FW:     576  gm      1 lb 4 oz     57  %
Gestational Age

LMP:           23w 4d        Date:  11/09/16                 EDD:   08/16/17
U/S Today:     23w 2d                                        EDD:   08/18/17
Best:          23w 0d     Det. By:  Early Ultrasound         EDD:   08/20/17
(01/30/17)
Anatomy

Cranium:               Appears normal         Aortic Arch:            Previously seen
Cavum:                 Appears normal         Ductal Arch:            Previously seen
Ventricles:            Appears normal         Diaphragm:              Previously seen
Choroid Plexus:        Previously seen        Stomach:                Appears normal, left
sided
Cerebellum:            Appears normal         Abdomen:                Appears normal
Posterior Fossa:       Previously seen        Abdominal Wall:         Previously seen
Nuchal Fold:           Not applicable (>20    Cord Vessels:           Previously seen
wks GA)
Face:                  Orbits and profile     Kidneys:                Left UTD
previously seen
Lips:                  Previously seen        Bladder:                Appears normal
Thoracic:              Appears normal         Spine:                  Appears normal
Heart:                 Previously seen        Upper Extremities:      Previously seen
RVOT:                  Appears normal         Lower Extremities:      Previously seen
LVOT:                  Appears normal

Other:  Parents do not wish to know sex of fetus. Male gender. Heels and LT
5th digit previously visualized.
Cervix Uterus Adnexa

Cervix
Length:            3.4  cm.
Normal appearance by transabdominal scan.
Impression

Single IUP at 23w 0d
Mild / borderline left urinary tract dilation was noted.
The left renal pelvis measures 5 mm.  No calyceal dilation is
noted (KIRI JIM1)
Fetal growth is appropriate (57th %tile)
Anterior placenta without previa
Normal amniotic fluid volume
Recommendations

Recommend follow-up ultrasound examination in 6 weeks to
reevaluate the fetal kidneys

## 2018-06-10 ENCOUNTER — Ambulatory Visit: Payer: Commercial Managed Care - PPO | Admitting: Internal Medicine

## 2018-06-22 IMAGING — US US MFM OB FOLLOW-UP
1 series · 14 of 28 positions shown · non-contrast
Comparison: none

[Series 1: us mfm ob follow-up · 53 acquisitions, 14 frames shown]
[im 2/53]
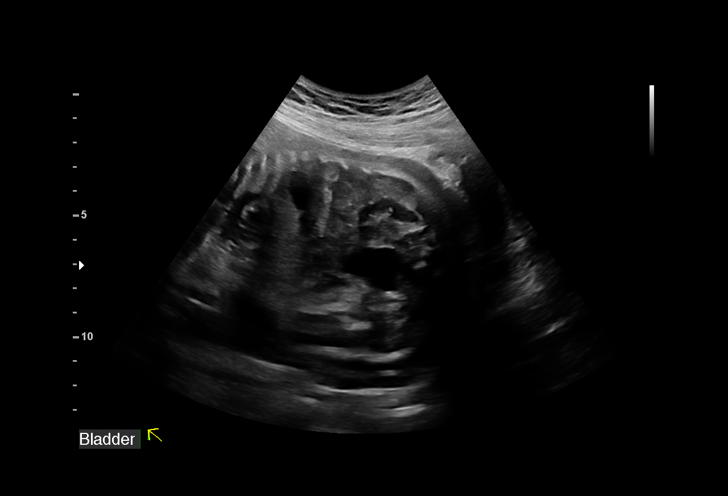
[im 6/53]
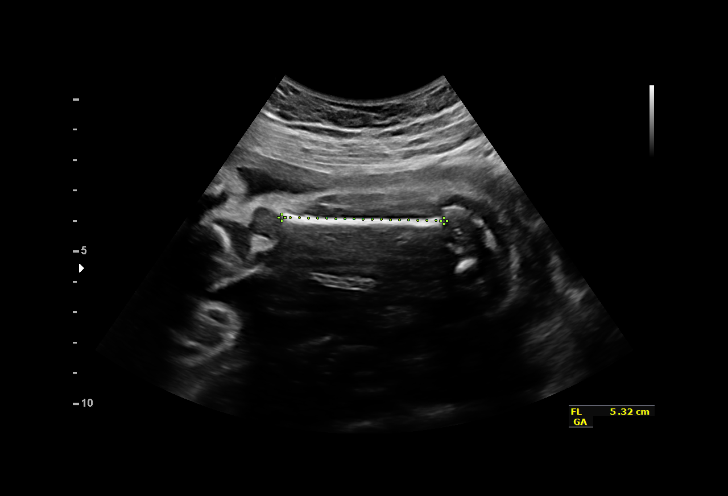
[im 10/53]
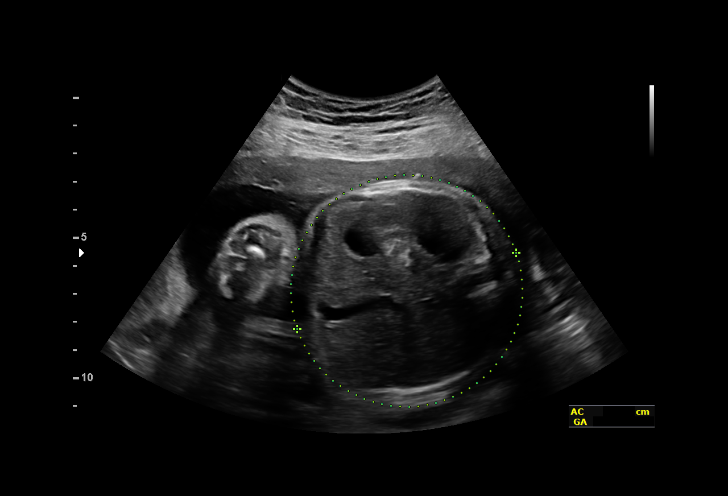
[im 14/53]
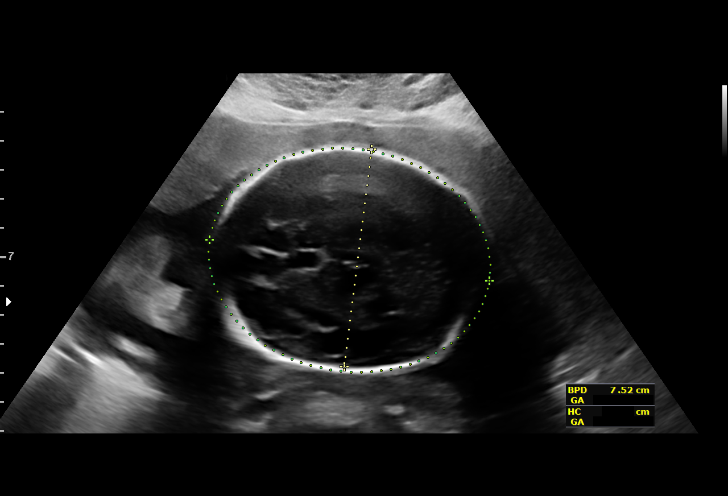
[im 18/53]
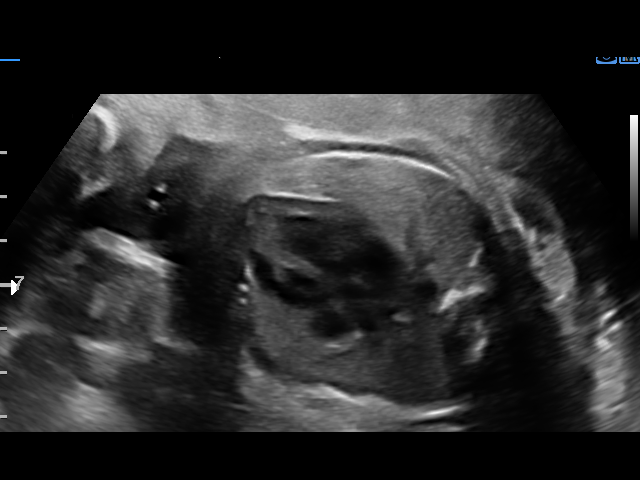
[im 22/53]
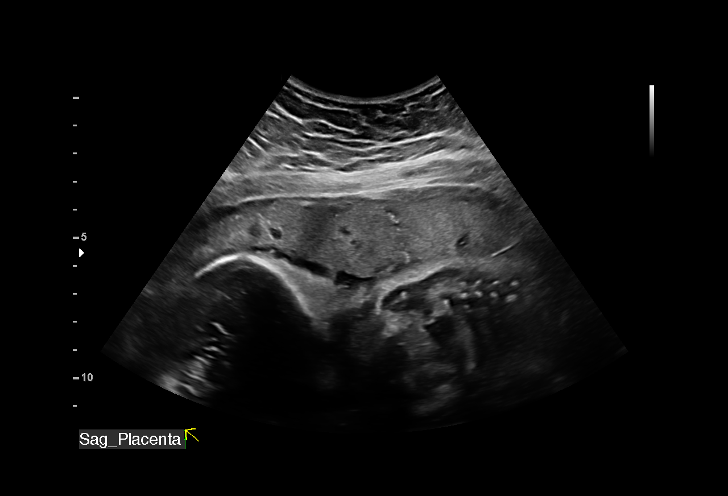
[im 26/53]
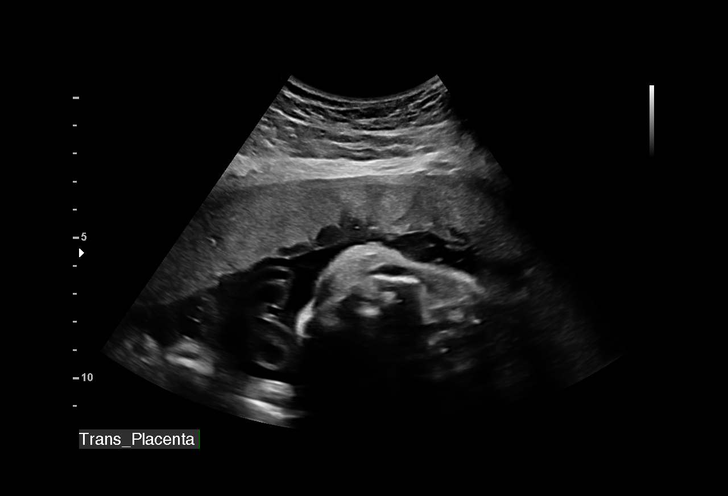
[im 29/53]
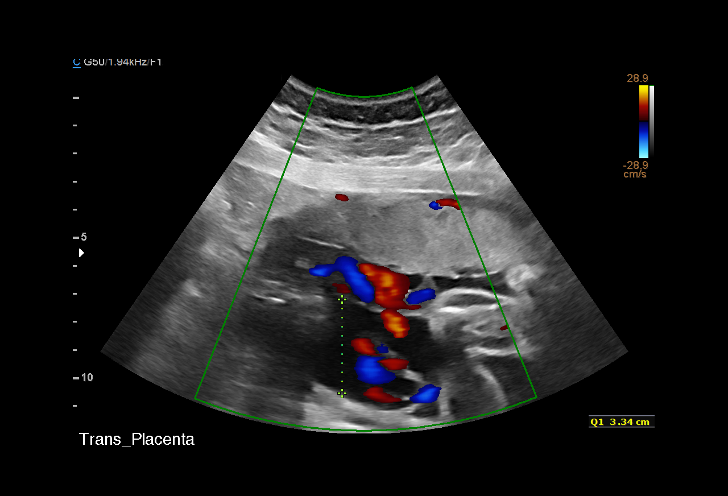
[im 33/53]
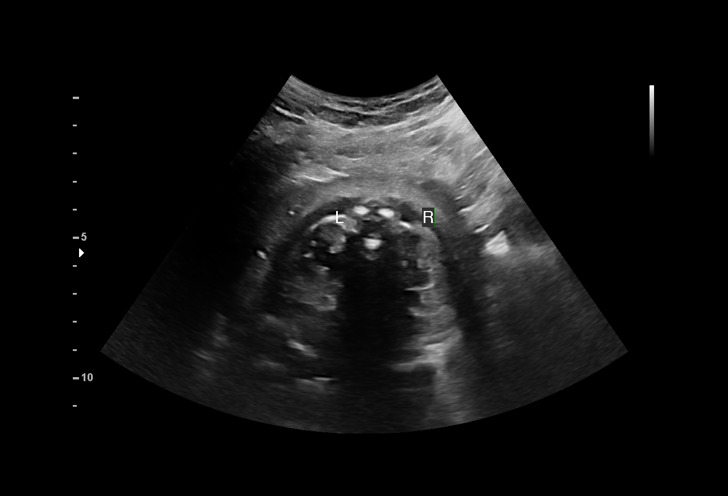
[im 37/53]
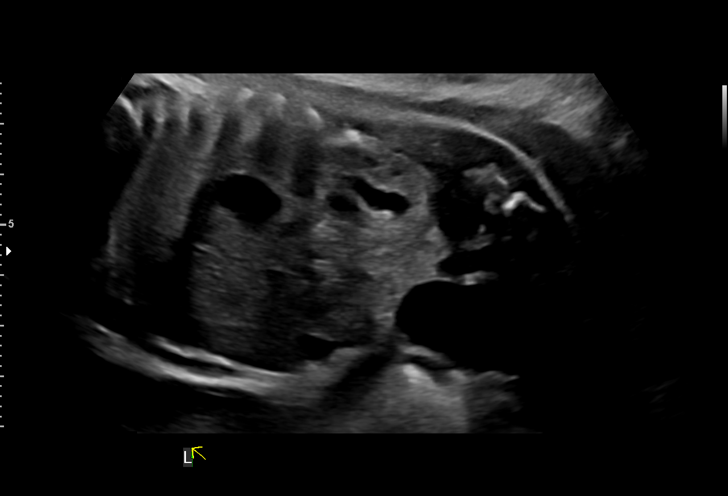
[im 41/53]
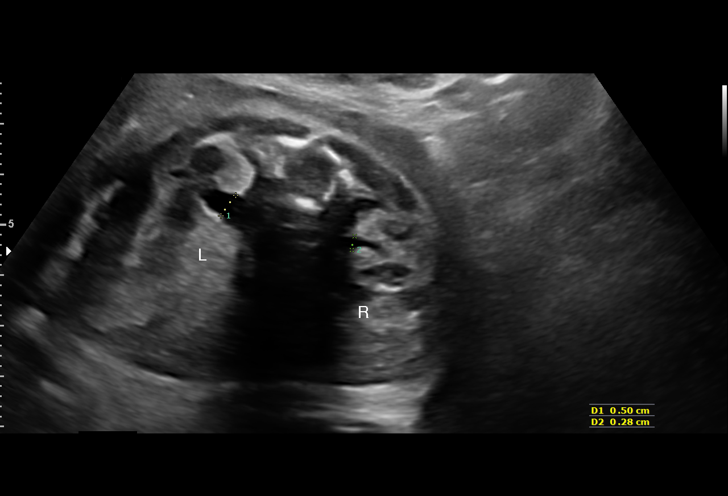
[im 45/53]
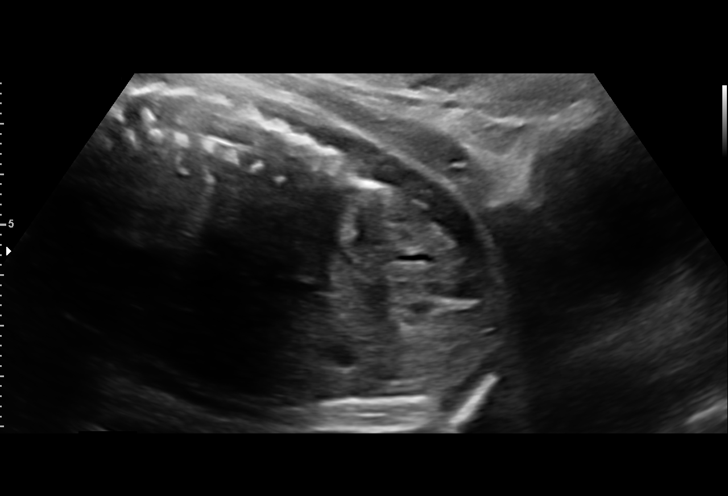
[im 49/53]
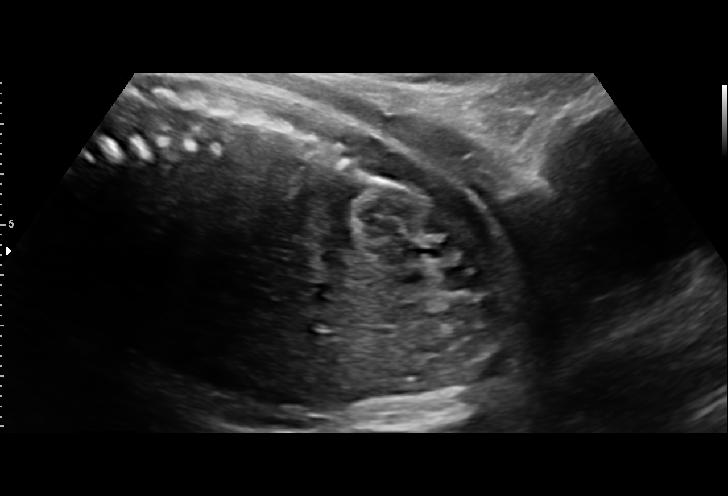
[im 53/53]
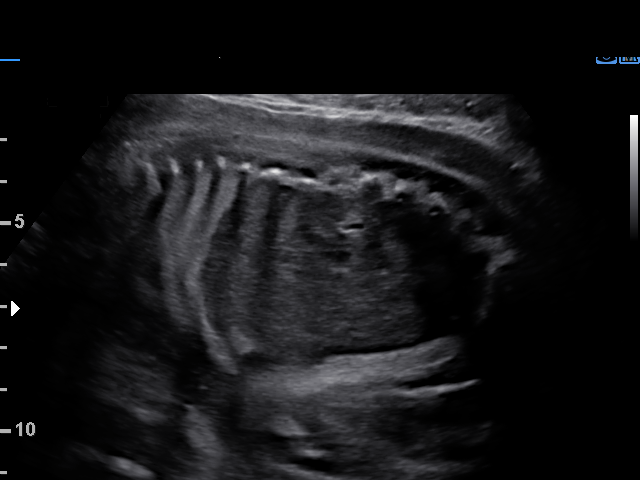

[14 of 28 positions shown; findings below may reference images not displayed]

Sreet

1  KELLYNA HAMDI                [id number]      [id number]     [id number]
Indications

29 weeks gestation of pregnancy
Encounter for other antenatal screening
follow-up
Pyelectasis of fetus on prenatal ultrasound
OB History

Blood Type:            Height:  5'2"   Weight (lb):  169      BMI:
Gravidity:    1         Term:   0        Prem:   0        SAB:   0
TOP:          0       Ectopic:  0        Living: 0
Fetal Evaluation

Num Of Fetuses:     1
Fetal Heart         150
Rate(bpm):
Cardiac Activity:   Observed
Presentation:       Breech
Placenta:           Anterior, above cervical os
P. Cord Insertion:  Previously Visualized

Amniotic Fluid
AFI FV:      Subjectively within normal limits

AFI Sum(cm)     %Tile       Largest Pocket(cm)
12.87           36

RUQ(cm)       RLQ(cm)       LUQ(cm)        LLQ(cm)
3.34
Biometry
BPD:      74.3  mm     G. Age:  29w 6d         50  %    CI:        71.99   %   70 - 86
FL/HC:      19.1   %   19.6 -
HC:      278.7  mm     G. Age:  30w 3d         48  %    HC/AC:      1.07       0.99 -
AC:      259.4  mm     G. Age:  30w 1d         64  %    FL/BPD:     71.6   %   71 - 87
FL:       53.2  mm     G. Age:  28w 2d         10  %    FL/AC:      20.5   %   20 - 24
HUM:      46.5  mm     G. Age:  27w 3d          6  %

Est. FW:    4829  gm      3 lb 2 oz     54  %
Gestational Age

LMP:           30w 0d       Date:   11/09/16                 EDD:   08/16/17
U/S Today:     29w 5d                                        EDD:   08/18/17
Best:          29w 3d    Det. By:   Early Ultrasound         EDD:   08/20/17
(01/30/17)
Anatomy

Cranium:               Appears normal         Aortic Arch:            Previously seen
Cavum:                 Appears normal         Ductal Arch:            Previously seen
Ventricles:            Appears normal         Diaphragm:              Previously seen
Choroid Plexus:        Previously seen        Stomach:                Appears normal, left
sided
Cerebellum:            Previously seen        Abdomen:                Appears normal
Posterior Fossa:       Previously seen        Abdominal Wall:         Previously seen
Nuchal Fold:           Not applicable (>20    Cord Vessels:           Previously seen
wks GA)
Face:                  Orbits and profile     Kidneys:                Appear normal
previously seen
Lips:                  Previously seen        Bladder:                Appears normal
Thoracic:              Appears normal         Spine:                  Previously seen
Heart:                 Previously seen        Upper Extremities:      Previously seen
RVOT:                  Previously seen        Lower Extremities:      Previously seen
LVOT:                  Previously seen

Other:  Male gender previously seen. Heels and LT 5th digit previously
visualized.
Cervix Uterus Adnexa

Cervix
Not visualized (advanced GA >39wks)
Impression

SIUP at 29+3 weeks
Normal interval anatomy; anatomic survey complete; kidneys
appeared normal today
Normal amniotic fluid volume
Appropriate interval growth with EFW at the 54th %tile
Recommendations

Follow-up as clinically indicated

## 2018-06-25 ENCOUNTER — Other Ambulatory Visit: Payer: Self-pay | Admitting: *Deleted

## 2018-06-26 MED ORDER — SERTRALINE HCL 25 MG PO TABS: 25.0000 mg | ORAL_TABLET | Freq: Every day | ORAL | 0 refills | Status: DC

## 2018-06-26 NOTE — Telephone Encounter (Signed)
Mychart message sent to patient asking that she call or send a message back to me so we can get her schedule for a follow up appt.  Jimmi Sidener,CMA

## 2018-06-26 NOTE — Telephone Encounter (Signed)
Will refill medication 1 time.  Would like to see patient before next refill to discuss regimen.  The reason for the change from 75 mg daily to 25 mg daily is not clear to me and I would like to ensure that she is being adequately treated.

## 2018-07-25 ENCOUNTER — Encounter: Payer: Self-pay | Admitting: Family Medicine

## 2018-07-25 ENCOUNTER — Ambulatory Visit (INDEPENDENT_AMBULATORY_CARE_PROVIDER_SITE_OTHER): Payer: Commercial Managed Care - PPO | Admitting: Family Medicine

## 2018-07-25 ENCOUNTER — Other Ambulatory Visit: Payer: Self-pay

## 2018-07-25 ENCOUNTER — Ambulatory Visit (INDEPENDENT_AMBULATORY_CARE_PROVIDER_SITE_OTHER): Payer: Commercial Managed Care - PPO | Admitting: Licensed Clinical Social Worker

## 2018-07-25 VITALS — BP 110/70 | HR 120 | Temp 98.6°F | Ht 62.0 in | Wt 186.0 lb

## 2018-07-25 DIAGNOSIS — F411 Generalized anxiety disorder: Secondary | ICD-10-CM | POA: Diagnosis not present

## 2018-07-25 MED ORDER — SERTRALINE HCL 100 MG PO TABS: 100.0000 mg | ORAL_TABLET | Freq: Every day | ORAL | 3 refills | Status: DC

## 2018-07-25 NOTE — Progress Notes (Signed)
    Subjective:  Karen Burnett is a 22 y.o. female [facility] chief complaint of poorly controlled anxiety.   HPI:  Problem  Generalized Anxiety Disorder   Karen Burnett came in today to discuss her anxiety.  She was seen several months ago by Dr. Nancy MarusMayo and at that time her sertraline was increased from 50-75.  Patient reports that she did feel the medication increase was helpful but that in the recent month she is felt that she has not been dealing with her stress well.  Significant stressors in her life include financial difficulties, caring for her child and relationship issues with her live-in boyfriend.    She also noted that she has been having "hot flashes" lately.  As often as once a day.  These episodes last about 10 minutes where she feels uncomfortably warm and flushed.  She does not think that these episodes are related to stress.  She wanted know if this was a side effect of her Nexplanon.  GAD 7 : Generalized Anxiety Score 07/25/2018 03/09/2017  Nervous, Anxious, on Edge 2 1  Control/stop worrying 3 1  Worry too much - different things 3 2  Trouble relaxing 1 0  Restless 2 1  Easily annoyed or irritable 3 2  Afraid - awful might happen 0 0  Total GAD 7 Score 14 7      Her gad 7 score today was 14 which is increased from her previous visit.      Objective:  Physical Exam: BP 110/70   Pulse (!) 120   Temp 98.6 F (37 C) (Oral)   Ht 5\' 2"  (1.575 m)   Wt 186 lb (84.4 kg)   SpO2 98%   Breastfeeding? No   BMI 34.02 kg/m   Gen: NAD, resting comfortably Pulm: Normal respiratory effort GI:  Soft, Nontender, Nondistended. MSK: no edema, cyanosis, or clubbing noted Skin: warm, dry Neuro: grossly normal, moves all extremities Psych: Normal affect and thought content, did not appear anxious or exasperated in clinic.  No results found for this or any previous visit (from the past 72 hour(s)).   Assessment/Plan:  Generalized anxiety  disorder Discussed how the best effect from SSRIs can be achieved with medication and therapy.  She was agreeable to this idea as she has gone to therapy in the past and found that if it could be helpful.  Her "hot flashes" are more likely related to small panic episodes.  Patient does have a history of panic attacks which she has not experienced since she began her SSRI. -Increase sertraline from 75 to 100 mg daily -Soft handoff with behavioral health today and plan for ongoing therapeutic visits

## 2018-07-25 NOTE — Patient Instructions (Addendum)
Today we talked briefly about your anxiety and you felt that it been more of an issue lately.  In addition to increasing your sertraline we also got  you set up with therapy.  I think that combining medication with therapy will be the best way to help your anxiety going forward.   Generalized Anxiety Disorder, Adult Generalized anxiety disorder (GAD) is a mental health disorder. People with this condition constantly worry about everyday events. Unlike normal anxiety, worry related to GAD is not triggered by a specific event. These worries also do not fade or get better with time. GAD interferes with life functions, including relationships, work, and school. GAD can vary from mild to severe. People with severe GAD can have intense waves of anxiety with physical symptoms (panic attacks). What are the causes? The exact cause of GAD is not known. What increases the risk? This condition is more likely to develop in:  Women.  People who have a family history of anxiety disorders.  People who are very shy.  People who experience very stressful life events, such as the death of a loved one.  People who have a very stressful family environment.  What are the signs or symptoms? People with GAD often worry excessively about many things in their lives, such as their health and family. They may also be overly concerned about:  Doing well at work.  Being on time.  Natural disasters.  Friendships.  Physical symptoms of GAD include:  Fatigue.  Muscle tension or having muscle twitches.  Trembling or feeling shaky.  Being easily startled.  Feeling like your heart is pounding or racing.  Feeling out of breath or like you cannot take a deep breath.  Having trouble falling asleep or staying asleep.  Sweating.  Nausea, diarrhea, or irritable bowel syndrome (IBS).  Headaches.  Trouble concentrating or remembering facts.  Restlessness.  Irritability.  How is this diagnosed? Your  health care provider can diagnose GAD based on your symptoms and medical history. You will also have a physical exam. The health care provider will ask specific questions about your symptoms, including how severe they are, when they started, and if they come and go. Your health care provider may ask you about your use of alcohol or drugs, including prescription medicines. Your health care provider may refer you to a mental health specialist for further evaluation. Your health care provider will do a thorough examination and may perform additional tests to rule out other possible causes of your symptoms. To be diagnosed with GAD, a person must have anxiety that:  Is out of his or her control.  Affects several different aspects of his or her life, such as work and relationships.  Causes distress that makes him or her unable to take part in normal activities.  Includes at least three physical symptoms of GAD, such as restlessness, fatigue, trouble concentrating, irritability, muscle tension, or sleep problems.  Before your health care provider can confirm a diagnosis of GAD, these symptoms must be present more days than they are not, and they must last for six months or longer. How is this treated? The following therapies are usually used to treat GAD:  Medicine. Antidepressant medicine is usually prescribed for long-term daily control. Antianxiety medicines may be added in severe cases, especially when panic attacks occur.  Talk therapy (psychotherapy). Certain types of talk therapy can be helpful in treating GAD by providing support, education, and guidance. Options include: ? Cognitive behavioral therapy (CBT). People learn coping  skills and techniques to ease their anxiety. They learn to identify unrealistic or negative thoughts and behaviors and to replace them with positive ones. ? Acceptance and commitment therapy (ACT). This treatment teaches people how to be mindful as a way to cope with  unwanted thoughts and feelings. ? Biofeedback. This process trains you to manage your body's response (physiological response) through breathing techniques and relaxation methods. You will work with a therapist while machines are used to monitor your physical symptoms.  Stress management techniques. These include yoga, meditation, and exercise.  A mental health specialist can help determine which treatment is best for you. Some people see improvement with one type of therapy. However, other people require a combination of therapies. Follow these instructions at home:  Take over-the-counter and prescription medicines only as told by your health care provider.  Try to maintain a normal routine.  Try to anticipate stressful situations and allow extra time to manage them.  Practice any stress management or self-calming techniques as taught by your health care provider.  Do not punish yourself for setbacks or for not making progress.  Try to recognize your accomplishments, even if they are small.  Keep all follow-up visits as told by your health care provider. This is important. Contact a health care provider if:  Your symptoms do not get better.  Your symptoms get worse.  You have signs of depression, such as: ? A persistently sad, cranky, or irritable mood. ? Loss of enjoyment in activities that used to bring you joy. ? Change in weight or eating. ? Changes in sleeping habits. ? Avoiding friends or family members. ? Loss of energy for normal tasks. ? Feelings of guilt or worthlessness. Get help right away if:  You have serious thoughts about hurting yourself or others. If you ever feel like you may hurt yourself or others, or have thoughts about taking your own life, get help right away. You can go to your nearest emergency department or call:  Your local emergency services (911 in the [place]  A suicide crisis helpline, such as the National Suicide Prevention Lifeline at  (367)262-9629. This is open 24 hours a day.  Summary  Generalized anxiety disorder (GAD) is a mental health disorder that involves worry that is not triggered by a specific event.  People with GAD often worry excessively about many things in their lives, such as their health and family.  GAD may cause physical symptoms such as restlessness, trouble concentrating, sleep problems, frequent sweating, nausea, diarrhea, headaches, and trembling or muscle twitching.  A mental health specialist can help determine which treatment is best for you. Some people see improvement with one type of therapy. However, other people require a combination of therapies. This information is not intended to replace advice given to you by your health care provider. Make sure you discuss any questions you have with your health care provider. Document Released: 03/31/2013 Document Revised: 10/24/2016 Document Reviewed: 10/24/2016 Elsevier Interactive Patient Education  [employer].

## 2018-07-25 NOTE — Assessment & Plan Note (Addendum)
Discussed how the best effect from SSRIs can be achieved with medication and therapy.  She was agreeable to this idea as she has gone to therapy in the past and found that if it could be helpful.  Her "hot flashes" are more likely related to small panic episodes.  Patient does have a history of panic attacks which she has not experienced since she began her SSRI. -Increase sertraline from 75 to 100 mg daily -Soft handoff with behavioral health today and plan for ongoing therapeutic visits

## 2018-07-26 NOTE — Progress Notes (Signed)
Type of Service: Integrated Behavioral Health/ Joint Visit   Karen MooreClarisa Burnett is a 22 y.o. female referred by Dr. Homero FellersFrank for assistance with managing her anxiety Patient was accompanied by her minor son. Patient is pleasant and engaged in conversation. Reports concerns: symptoms of anxiety. Duration of problem: several years   Life & Social patient lives with son and boy-friend ,works during the day  Recent life changes: financial stress, relationship difficulties, managing work, and her son Issues discussed: support system, previous and current coping skills , ongoing therapy options, and Integrated care services.    GOALS ADDRESSED:  Patient will: 1. Increase knowledge and/or ability of: coping skills, self-management skills and stress reduction  2.   Reduce symptoms of: anxiety and stress Intervention: Reflective listening, supportive counseling, solutions focus strategies, demonstration  of relaxed breathing ;Psychoeducation  Assessment/Plan:  Patient is currently experiencing symptoms of anxiety which maybe exacerbated by stress.  Patient may benefit from , and is in agreement to return in 1 to 2 weeks [facility] and brief therapeutic interventions to assist with managing her anxiety.  Patient will: 1.  implement relaxed breathing as demonstrated today 2. take medication as prescribed by PCP (increase sertaline from 75 mg to 100mg ) 3. Schedule F/U appointment after she looks at work schedule   Warm Hand Off Completed.      Sammuel Hineseborah Bridget [place], LCSW Licensed Clinical Social Worker [facility]   [phone] 8:18 AM

## 2018-08-15 ENCOUNTER — Ambulatory Visit: Payer: Commercial Managed Care - PPO

## 2018-08-16 ENCOUNTER — Other Ambulatory Visit: Payer: Self-pay | Admitting: Family Medicine

## 2018-08-16 DIAGNOSIS — F411 Generalized anxiety disorder: Secondary | ICD-10-CM

## 2018-08-16 NOTE — Telephone Encounter (Signed)
Pharmacy sent this request for a 90 day supply.  Ples SpecterAlisa Harbour Nordmeyer, RN [facility] RN)

## 2018-09-05 ENCOUNTER — Ambulatory Visit (INDEPENDENT_AMBULATORY_CARE_PROVIDER_SITE_OTHER): Payer: Self-pay | Admitting: Family Medicine

## 2018-09-05 ENCOUNTER — Encounter: Payer: Self-pay | Admitting: Family Medicine

## 2018-09-05 ENCOUNTER — Other Ambulatory Visit: Payer: Self-pay

## 2018-09-05 VITALS — BP 108/62 | HR 72 | Temp 98.2°F | Wt 182.0 lb

## 2018-09-05 DIAGNOSIS — F411 Generalized anxiety disorder: Secondary | ICD-10-CM

## 2018-09-05 DIAGNOSIS — F329 Major depressive disorder, single episode, unspecified: Secondary | ICD-10-CM

## 2018-09-05 MED ORDER — SERTRALINE HCL 100 MG PO TABS: 150.0000 mg | ORAL_TABLET | Freq: Every day | ORAL | 0 refills | Status: DC

## 2018-09-05 NOTE — Patient Instructions (Signed)
Major Depressive Disorder, Adult Major depressive disorder (MDD) is a mental health condition. It may also be called clinical depression or unipolar depression. MDD usually causes feelings of sadness, hopelessness, or helplessness. MDD can also cause physical symptoms. It can interfere with work, school, relationships, and other everyday activities. MDD may be mild, moderate, or severe. It may occur once (single episode major depressive disorder) or it may occur multiple times (recurrent major depressive disorder). What are the causes? The exact cause of this condition is not known. MDD is most likely caused by a combination of things, which may include:  Genetic factors. These are traits that are passed along from parent to child.  Individual factors. Your personality, your behavior, and the way you handle your thoughts and feelings may contribute to MDD. This includes personality traits and behaviors learned from others.  Physical factors, such as: ? Differences in the part of your brain that controls emotion. This part of your brain may be different than it is in people who do not have MDD. ? Long-term (chronic) medical or psychiatric illnesses.  Social factors. Traumatic experiences or major life changes may play a role in the development of MDD.  What increases the risk? This condition is more likely to develop in women. The following factors may also make you more likely to develop MDD:  A family history of depression.  Troubled family relationships.  Abnormally low levels of certain brain chemicals.  Traumatic events in childhood, especially abuse or the loss of a parent.  Being under a lot of stress, or long-term stress, especially from upsetting life experiences or losses.  A history of: ? Chronic physical illness. ? Other mental health disorders. ? Substance abuse.  Poor living conditions.  Experiencing social exclusion or discrimination on a regular basis.  What are  the signs or symptoms? The main symptoms of MDD typically include:  Constant depressed or irritable mood.  Loss of interest in things and activities.  MDD symptoms may also include:  Sleeping or eating too much or too little.  Unexplained weight change.  Fatigue or low energy.  Feelings of worthlessness or guilt.  Difficulty thinking clearly or making decisions.  Thoughts of suicide or of harming others.  Physical agitation or weakness.  Isolation.  Severe cases of MDD may also occur with other symptoms, such as:  Delusions or hallucinations, in which you imagine things that are not real (psychotic depression).  Low-level depression that lasts at least a year (chronic depression or persistent depressive disorder).  Extreme sadness and hopelessness (melancholic depression).  Trouble speaking and moving (catatonic depression).  How is this diagnosed? This condition may be diagnosed based on:  Your symptoms.  Your medical history, including your mental health history. This may involve tests to evaluate your mental health. You may be asked questions about your lifestyle, including any drug and alcohol use, and how long you have had symptoms of MDD.  A physical exam.  Blood tests to rule out other conditions.  You must have a depressed mood and at least four other MDD symptoms most of the day, nearly every day in the same 2-week timeframe before your health care provider can confirm a diagnosis of MDD. How is this treated? This condition is usually treated by mental health professionals, such as psychologists, psychiatrists, and clinical social workers. You may need more than one type of treatment. Treatment may include:  Psychotherapy. This is also called talk therapy or counseling. Types of psychotherapy include: ? Cognitive behavioral   therapy (CBT). This type of therapy teaches you to recognize unhealthy feelings, thoughts, and behaviors, and replace them with  positive thoughts and actions. ? Interpersonal therapy (IPT). This helps you to improve the way you relate to and communicate with others. ? Family therapy. This treatment includes members of your family.  Medicine to treat anxiety and depression, or to help you control certain emotions and behaviors.  Lifestyle changes, such as: ? Limiting alcohol and drug use. ? Exercising regularly. ? Getting plenty of sleep. ? Making healthy eating choices. ? Spending more time outdoors.  Treatments involving stimulation of the brain can be used in situations with extremely severe symptoms, or when medicine or other therapies do not work over time. These treatments include electroconvulsive therapy, transcranial magnetic stimulation, and vagal nerve stimulation. Follow these instructions at home: Activity  Return to your normal activities as told by your health care provider.  Exercise regularly and spend time outdoors as told by your health care provider. General instructions  Take over-the-counter and prescription medicines only as told by your health care provider.  Do not drink alcohol. If you drink alcohol, limit your alcohol intake to no more than 1 drink a day for nonpregnant women and 2 drinks a day for men. One drink equals 12 oz of beer, 5 oz of wine, or 1 oz of hard liquor. Alcohol can affect any antidepressant medicines you are taking. Talk to your health care provider about your alcohol use.  Eat a healthy diet and get plenty of sleep.  Find activities that you enjoy doing, and make time to do them.  Consider joining a support group. Your health care provider may be able to recommend a support group.  Keep all follow-up visits as told by your health care provider. This is important. Where to find more information: National Alliance on Mental Illness  www.nami.org  [place] National Institute of Mental Health  www.nimh.nih.gov  National Suicide Prevention  Lifeline  1-800-273-TALK (8255). This is free, 24-hour help.  Contact a health care provider if:  Your symptoms get worse.  You develop new symptoms. Get help right away if:  You self-harm.  You have serious thoughts about hurting yourself or others.  You see, hear, taste, smell, or feel things that are not present (hallucinate). This information is not intended to replace advice given to you by your health care provider. Make sure you discuss any questions you have with your health care provider. Document Released: 03/31/2013 Document Revised: 08/10/2016 Document Reviewed: 06/14/2016 Elsevier Interactive Patient Education  2018 [employer].  

## 2018-09-05 NOTE — Progress Notes (Signed)
    Subjective:  Karen MooreClarisa Burnett is a 22 y.o. female [facility] regarding anxiety/depression.  HPI:  Problem  Generalized Anxiety Disorder   [name] is here to follow up for anxiety/depression.  She reports that her anxiety seems to be about stable since her previous visit but that she has felt increasingly depressed.  Her main depressive sxs include: lack of interest in daily activities and feeling tired despite sleeping ~10 hours/night. She denies thoughts of hurting herself or others.  She had been scheduled for a BH visit but was unable to make it due to a work related issue.  She is concerned that the sertraline may be contributing to her increased feelings of depression.    She reports that her relationship with her significant other has been slowly improving. Work remains a source of stress because she feels that she is competent but not recognized (compensated) for it.   GAD 7 : Generalized Anxiety Score 09/05/2018 07/25/2018 03/09/2017  Nervous, Anxious, on Edge 2 2 1   Control/stop worrying 2 3 1   Worry too much - different things 3 3 2   Trouble relaxing 3 1 0  Restless 1 2 1   Easily annoyed or irritable 3 3 2   Afraid - awful might happen 3 0 0  Total GAD 7 Score 17 14 7       [facility]  PHQ-9 Total Score  16           Objective:  Physical Exam: BP 108/62   Pulse 72   Temp 98.2 F (36.8 C) (Oral)   Wt 182 lb (82.6 kg)   BMI 33.29 kg/m   Gen: NAD, resting comfortably, pt was controlled and did not exhibit overt signs of anxiety or depression during our conversation.   No results found for this or any previous visit (from the past 72 hour(s)).   Assessment/Plan:  Generalized anxiety disorder We discussed that her sertraline is unlikely to have caused her depressive sxs.  We planned to increase her sertraline from 100 mg to 150 mg and will attempt [facility] -Sertraline increased from 100 to 150 -[facility] -return in 2-4 weeks.

## 2018-09-05 NOTE — Assessment & Plan Note (Signed)
We discussed that her sertraline is unlikely to have caused her depressive sxs.  We planned to increase her sertraline from 100 mg to 150 mg and will [facility] to [facility]. -Sertraline increased from 100 to 150 -[facility] -return in 2-4 weeks.

## 2018-10-14 ENCOUNTER — Ambulatory Visit: Payer: Self-pay

## 2018-10-14 ENCOUNTER — Ambulatory Visit: Payer: Self-pay | Admitting: Family Medicine

## 2018-10-20 ENCOUNTER — Other Ambulatory Visit: Payer: Self-pay | Admitting: Family Medicine

## 2018-10-20 DIAGNOSIS — F411 Generalized anxiety disorder: Secondary | ICD-10-CM

## 2018-10-28 ENCOUNTER — Telehealth: Payer: Self-pay | Admitting: Psychology

## 2018-10-28 NOTE — Telephone Encounter (Signed)
[facility] intern called patient to gauge interest in integrated care services, as she missed her follow-up appointment on 10/28. Patient reported that she has had car troubles, which is the main reason that she missed her appointment. She is experiencing difficulty with transportation and is hoping to get a new car next month. She reports that she will call [facility] to make a follow-up appointment once her transportation is sorted, as she is still interested in integrated care for managing her depression.

## 2018-12-19 ENCOUNTER — Encounter: Payer: Self-pay | Admitting: Family Medicine

## 2018-12-19 ENCOUNTER — Ambulatory Visit (INDEPENDENT_AMBULATORY_CARE_PROVIDER_SITE_OTHER): Payer: Commercial Managed Care - PPO | Admitting: Family Medicine

## 2018-12-19 ENCOUNTER — Other Ambulatory Visit: Payer: Self-pay

## 2018-12-19 VITALS — BP 122/84 | Temp 98.7°F | Ht 62.0 in | Wt 189.0 lb

## 2018-12-19 DIAGNOSIS — E663 Overweight: Secondary | ICD-10-CM

## 2018-12-19 DIAGNOSIS — F411 Generalized anxiety disorder: Secondary | ICD-10-CM | POA: Diagnosis not present

## 2018-12-19 MED ORDER — SERTRALINE HCL 100 MG PO TABS: 150.0000 mg | ORAL_TABLET | Freq: Every day | ORAL | 1 refills | Status: DC

## 2018-12-19 NOTE — Patient Instructions (Addendum)
It was great to see you today!  I'm glad you're doing well.  Lets keep your medication regimen steady for now.  Regarding healthy eating, I attached a lot of information below and you can take a look at the website [email or web address] for more info.  I'm sorry to learn that it won't be possible to follow with [place] here at the clinic for therapy for Anxiety.  I was told that you will have to contact your insurance provider to see where you can be seen for therapy sessions.  I realize this is inconvenient.  I'm sorry about that.  Healthy Eating Following a healthy eating pattern may help you to achieve and maintain a healthy body weight, reduce the risk of chronic disease, and live a long and productive life. It is important to follow a healthy eating pattern at an appropriate calorie level for your body. Your nutritional needs should be met primarily through food by choosing a variety of nutrient-rich foods. What are tips for following this plan? Reading food labels  Read labels and choose the following: ? Reduced or low sodium. ? Juices with 100% fruit juice. ? Foods with low saturated fats and high polyunsaturated and monounsaturated fats. ? Foods with whole grains, such as whole wheat, cracked wheat, brown rice, and wild rice. ? Whole grains that are fortified with folic acid. This is recommended for women who are pregnant or who want to become pregnant.  Read labels and avoid the following: ? Foods with a lot of added sugars. These include foods that contain brown sugar, corn sweetener, corn syrup, dextrose, fructose, glucose, high-fructose corn syrup, honey, invert sugar, lactose, malt syrup, maltose, molasses, raw sugar, sucrose, trehalose, or turbinado sugar.  Do not eat more than the following amounts of added sugar per day:  6 teaspoons (25 g) for women.  9 teaspoons (38 g) for men. ? Foods that contain processed or refined starches and grains. ? Refined grain products, such as white flour,  degermed cornmeal, white bread, and white rice. Shopping  Choose nutrient-rich snacks, such as vegetables, whole fruits, and nuts. Avoid high-calorie and high-sugar snacks, such as potato chips, fruit snacks, and candy.  Use oil-based dressings and spreads on foods instead of solid fats such as butter, stick margarine, or cream cheese.  Limit pre-made sauces, mixes, and "instant" products such as flavored rice, instant noodles, and ready-made pasta.  Try more plant-protein sources, such as tofu, tempeh, black beans, edamame, lentils, nuts, and seeds.  Explore eating plans such as the Mediterranean diet or vegetarian diet. Cooking  Use oil to saut or stir-fry foods instead of solid fats such as butter, stick margarine, or lard.  Try baking, boiling, grilling, or broiling instead of frying.  Remove the fatty part of meats before cooking.  Steam vegetables in water or broth. Meal planning   At meals, imagine dividing your plate into fourths: ? One-half of your plate is fruits and vegetables. ? One-fourth of your plate is whole grains. ? One-fourth of your plate is protein, especially lean meats, poultry, eggs, tofu, beans, or nuts.  Include low-fat dairy as part of your daily diet. Lifestyle  Choose healthy options in all settings, including home, work, school, restaurants, or stores.  Prepare your food safely: ? Wash your hands after handling raw meats. ? Keep food preparation surfaces clean by regularly washing with hot, soapy water. ? Keep raw meats separate from ready-to-eat foods, such as fruits and vegetables. ? Cook seafood, meat, poultry, and eggs to  the recommended internal temperature. ? Store foods at safe temperatures. In general:  Keep cold foods at 28F (4.4C) or below.  Keep hot foods at 128F (60C) or above.  Keep your freezer at [facility] (-17.8C) or below.  Foods are no longer safe to eat when they have been between the temperatures of 40-128F (4.4-60C)  for more than 2 hours. What foods should I eat? Fruits Aim to eat 2 cup-equivalents of fresh, canned (in natural juice), or frozen fruits each day. Examples of 1 cup-equivalent of fruit include 1 small apple, 8 large strawberries, 1 cup canned fruit,  cup dried fruit, or 1 cup 100% juice. Vegetables Aim to eat 2-3 cup-equivalents of fresh and frozen vegetables each day, including different varieties and colors. Examples of 1 cup-equivalent of vegetables include 2 medium carrots, 2 cups raw, leafy greens, 1 cup chopped vegetable (raw or cooked), or 1 medium baked potato. Grains Aim to eat 6 ounce-equivalents of whole grains each day. Examples of 1 ounce-equivalent of grains include 1 slice of bread, 1 cup ready-to-eat cereal, 3 cups popcorn, or  cup cooked rice, pasta, or cereal. Meats and other proteins Aim to eat 5-6 ounce-equivalents of protein each day. Examples of 1 ounce-equivalent of protein include 1 egg, 1/2 cup nuts or seeds, or 1 tablespoon (16 g) peanut butter. A cut of meat or fish that is the size of a deck of cards is about 3-4 ounce-equivalents.  Of the protein you eat each week, try to have at least 8 ounces come from seafood. This includes salmon, trout, herring, and anchovies. Dairy Aim to eat 3 cup-equivalents of fat-free or low-fat dairy each day. Examples of 1 cup-equivalent of dairy include 1 cup (240 mL) milk, 8 ounces (250 g) yogurt, 1 ounces (44 g) natural cheese, or 1 cup (240 mL) fortified soy milk. Fats and oils  Aim for about 5 teaspoons (21 g) per day. Choose monounsaturated fats, such as canola and olive oils, avocados, peanut butter, and most nuts, or polyunsaturated fats, such as sunflower, corn, and soybean oils, walnuts, pine nuts, sesame seeds, sunflower seeds, and flaxseed. Beverages  Aim for six 8-oz glasses of water per day. Limit coffee to three to five 8-oz cups per day.  Limit caffeinated beverages that have added calories, such as soda and energy  drinks.  Limit alcohol intake to no more than 1 drink a day for nonpregnant women and 2 drinks a day for men. One drink equals 12 oz of beer (355 mL), 5 oz of wine (148 mL), or 1 oz of hard liquor (44 mL). Seasoning and other foods  Avoid adding excess amounts of salt to your foods. Try flavoring foods with herbs and spices instead of salt.  Avoid adding sugar to foods.  Try using oil-based dressings, sauces, and spreads instead of solid fats. This information is based on general [place] nutrition guidelines. For more information, visit BuildDNA.es. Exact amounts may vary based on your nutrition needs. Summary  A healthy eating plan may help you to maintain a healthy weight, reduce the risk of chronic diseases, and stay active throughout your life.  Plan your meals. Make sure you eat the right portions of a variety of nutrient-rich foods.  Try baking, boiling, grilling, or broiling instead of frying.  Choose healthy options in all settings, including home, work, school, restaurants, or stores. This information is not intended to replace advice given to you by your health care provider. Make sure you discuss any questions you have with  your health care provider. Document Released: 03/18/2018 Document Revised: 03/18/2018 Document Reviewed: 03/18/2018 Elsevier Interactive Patient Education  2019 [employer].

## 2018-12-19 NOTE — Assessment & Plan Note (Signed)
I was concerned about her running out of her sertraline she appears to have gone through withdrawal episode.  She was encouraged to call when she runs out of this medication as withdrawal can be serious.  She is only been out of medication for 2 weeks.  I would like her to continue current medication regimen as her anxiety appears to be well controlled with the exception of her running out of medication.  Sadly, we do not have the resources to provide behavioral health therapy for her here at the [facility].  She was encouraged to call her insurance to find out where she can receive treatment. -Continue sertraline 150 mg daily -Contact insurance about possible coverage for behavioral health therapy

## 2018-12-19 NOTE — Assessment & Plan Note (Signed)
Her weight is been increasing over the past 2 years since she became pregnant with her son.  Her BMI is now 34.  We discussed appropriate nutrition she was given information about healthy eating and [email or web address]. -Continue to monitor

## 2018-12-19 NOTE — Progress Notes (Signed)
Subjective:  Karen Burnett is a 23 y.o. female who presents to the [facility] today for anxiety follow-up.  HPI:  Ms. Maberry reports that she is feeling significantly less anxious than her previous visit.  Sadly, she was not able to make it to her last appointment due to broken down car and she is taking time off of work.  Since our last appointment, she ran out of sertraline.  She ran out about 2 weeks ago.  In the days following her last dose of sertraline, she found herself significantly tachycardic and generally "feeling like shit."  She ultimately had a panic attack at work 1 day ended up going home early.  She reports that her manager also takes anxiety medication and suggested that she may be withdrawing and should be seen.  She did not contact me at that time because she knew she had missed her last appointment did not think she could get a medication refill she had not been seen in clinic.  Since that one panic attack over a week ago, she has been feeling improved.  Speaking more generally about her level of anxiety, she reports that several things have happened recently which have since reduced her overall anxiety.  Her broken down car was replaced with a new car which works well.  She has begun looking for new job opportunities and is hopeful about recent job applications.  She also reports that her home life has been doing well and she and her significant other are planning to move to a different situation without roommates.  Her GAD score today is 8 which is reduced from her last several visits. GAD 7 : Generalized Anxiety Score 12/19/2018 09/05/2018 07/25/2018 03/09/2017  Nervous, Anxious, on Edge 1 2 2 1   Control/stop worrying 2 2 3 1   Worry too much - different things 2 3 3 2   Trouble relaxing 1 3 1  0  Restless 0 1 2 1   Easily annoyed or irritable 2 3 3 2   Afraid - awful might happen 0 3 0 0  Total GAD 7 Score 8 17 14 7    She feels that her anxiety has been doing well and was  interested in talking more about nutrition today.  She feels that she has recently been gaining weight and would like to take her diet more seriously in order to be healthier maintain more control over her life.  Chief Complaint noted Review of Symptoms - see HPI PMH - Smoking status noted.    Objective:  Physical Exam: BP 122/84   Temp 98.7 F (37.1 C) (Oral)   Ht 5\' 2"  (1.575 m)   Wt 189 lb (85.7 kg)   Breastfeeding No   BMI 34.57 kg/m    Gen: NAD, resting comfortably CV: RRR with no murmurs appreciated Pulm: NWOB, CTAB with no crackles, wheezes, or rhonchi GI: Normal bowel sounds present. Soft, Nontender, Nondistended. MSK: no edema, cyanosis, or clubbing noted Skin: warm, dry Neuro: grossly normal, moves all extremities Psych: Normal affect and thought content  No results found for this or any previous visit (from the past 72 hour(s)).   Assessment/Plan:  Generalized anxiety disorder I was concerned about her running out of her sertraline she appears to have gone through withdrawal episode.  She was encouraged to call when she runs out of this medication as withdrawal can be serious.  She is only been out of medication for 2 weeks.  I would like her to continue current medication regimen as  her anxiety appears to be well controlled with the exception of her running out of medication.  Sadly, we do not have the resources to provide behavioral health therapy for her here at the family clinic.  She was encouraged to call her insurance to find out where she can receive treatment. -Continue sertraline 150 mg daily -Contact insurance about possible coverage for behavioral health therapy  Overweight Her weight is been increasing over the past 2 years since she became pregnant with her son.  Her BMI is now 34.  We discussed appropriate nutrition she was given information about healthy eating and [email or web address]. -Continue to monitor

## 2019-01-13 ENCOUNTER — Encounter (HOSPITAL_COMMUNITY): Payer: Self-pay | Admitting: Family Medicine

## 2019-01-13 ENCOUNTER — Ambulatory Visit (HOSPITAL_COMMUNITY)
Admission: EM | Admit: 2019-01-13 | Discharge: 2019-01-13 | Disposition: A | Discharge status: Home / Self Care | Payer: Commercial Managed Care - PPO | Attending: Family Medicine | Admitting: Family Medicine

## 2019-01-13 ENCOUNTER — Other Ambulatory Visit: Payer: Self-pay

## 2019-01-13 DIAGNOSIS — J40 Bronchitis, not specified as acute or chronic: Secondary | ICD-10-CM

## 2019-01-13 MED ORDER — PREDNISONE 50 MG PO TABS: ORAL_TABLET | ORAL | 0 refills | Status: DC

## 2019-01-13 MED ORDER — AMOXICILLIN 875 MG PO TABS: 875.0000 mg | ORAL_TABLET | Freq: Two times a day (BID) | ORAL | 0 refills | Status: DC

## 2019-01-13 MED ORDER — BENZONATATE 100 MG PO CAPS: 100.0000 mg | ORAL_CAPSULE | Freq: Three times a day (TID) | ORAL | 0 refills | Status: DC | PRN

## 2019-01-13 MED FILL — BENZONATATE 100 MG CAPS: 100 | 7 days supply | Qty: 40 | Fill #0

## 2019-01-13 MED FILL — predniSONE 50 MG TABS: 50 | 5 days supply | Qty: 5 | Fill #0

## 2019-01-13 MED FILL — AMOXICILLIN 875 MG TABS: 875 | 10 days supply | Qty: 20 | Fill #0

## 2019-01-13 NOTE — ED Triage Notes (Signed)
Pt cc coughing and wheezing x 2 weeks.

## 2019-01-13 NOTE — ED Provider Notes (Signed)
[facility]    CSN: [id number] Arrival date & time: 01/13/19  1053     History   Chief Complaint Chief Complaint  Patient presents with  . Cough    HPI Karen Burnett is a 23 y.o. female.   Symptoms began about 2 weeks ago.  Patient is non-smoker but she has a history of asthma as a child.  Patient's had no fever.  This is the first [facility] urgent care visit for this 23 year old woman who complains of cough and wheezing.     Past Medical History:  Diagnosis Date  . Anxiety   . Ultrasound recheck of fetal pyelectasis, antepartum 05/24/2017  . Vaginal Pap smear, abnormal 10/2016   ASCUS, HPV neg so needs routine screen    Patient Active Problem List   Diagnosis Date Noted  . Overweight 12/19/2018  . Generalized anxiety disorder 01/02/2017  . Family history of diabetes mellitus 10/01/2015  . Nexplanon insertion 06/24/2015    Past Surgical History:  Procedure Laterality Date  . NO PAST SURGERIES      OB History    Gravida  1   Para  1   Term  1   Preterm  0   AB  0   Living  1     SAB  0   TAB  0   Ectopic  0   Multiple  0   Live Births  1            Home Medications    Prior to Admission medications   Medication Sig Start Date End Date Taking? Authorizing Provider  amoxicillin (AMOXIL) 875 MG tablet Take 1 tablet (875 mg total) by mouth 2 (two) times daily. 01/13/19   Elvina Sidle, MD  benzonatate (TESSALON) 100 MG capsule Take 1-2 capsules (100-200 mg total) by mouth 3 (three) times daily as needed for cough. 01/13/19   Elvina Sidle, MD  predniSONE (DELTASONE) 50 MG tablet One daily with food 01/13/19   Elvina Sidle, MD  Prenatal MV-Min-FA-Omega-3 (PRENATAL GUMMIES/DHA & FA) 0.4-32.5 MG CHEW Chew 1 each by mouth daily. 03/09/17   Leland Her, DO  sertraline (ZOLOFT) 100 MG tablet Take 1.5 tablets (150 mg total) by mouth daily. 12/19/18   Mirian Mo, MD    Family History Family History  Problem Relation Age  of Onset  . Thyroid disease Mother   . Diabetes Father   . Arthritis Father   . Psoriasis Father   . Psoriasis Brother   . Diabetes Maternal Aunt   . Diabetes Maternal Uncle   . Diabetes Paternal Aunt   . Diabetes Paternal Uncle   . Diabetes Maternal Grandmother   . Diabetes Maternal Grandfather   . Hypertension Maternal Grandfather   . Diabetes Paternal Grandmother   . Diabetes Paternal Grandfather     Social History Social History   Tobacco Use  . Smoking status: Never Smoker  . Smokeless tobacco: Never Used  Substance Use Topics  . Alcohol use: No    Alcohol/week: 0.0 standard drinks  . Drug use: No     Allergies   Patient has no known allergies.   Review of Systems Review of Systems  Constitutional: Positive for fatigue. Negative for activity change, appetite change, chills, diaphoresis and fever.  HENT: Positive for sore throat. Negative for congestion.   Respiratory: Positive for cough, chest tightness and wheezing.   Cardiovascular: Negative for chest pain.     Physical Exam Triage Vital Signs ED Triage Vitals [  01/13/19 1127]  Enc Vitals Group     BP 132/84     Pulse Rate 82     Resp 18     Temp 98.7 F (37.1 C)     Temp Source Oral     SpO2 97 %     Weight      Height      Head Circumference      Peak Flow      Pain Score      Pain Loc      Pain Edu?      Excl. in GC?    No data found.  Updated Vital Signs BP 132/84 (BP Location: Left Arm)   Pulse 82   Temp 98.7 F (37.1 C) (Oral)   Resp 18   Wt 83.9 kg   SpO2 97%   BMI 33.84 kg/m    Physical Exam Vitals signs and nursing note reviewed.  Constitutional:      Appearance: Normal appearance.  HENT:     Head: Normocephalic and atraumatic.     Right Ear: Tympanic membrane normal.     Left Ear: Tympanic membrane normal.     Nose: Nose normal.     Mouth/Throat:     Mouth: Mucous membranes are moist.  Eyes:     Conjunctiva/sclera: Conjunctivae normal.  Cardiovascular:     Rate  and Rhythm: Normal rate and regular rhythm.     Pulses: Normal pulses.     Heart sounds: Normal heart sounds.  Pulmonary:     Effort: Pulmonary effort is normal.     Breath sounds: Wheezing present.  Musculoskeletal: Normal range of motion.  Skin:    General: Skin is warm and dry.  Neurological:     General: No focal deficit present.     Mental Status: She is alert and oriented to person, place, and time.  Psychiatric:        Mood and Affect: Mood normal.        Behavior: Behavior normal.      UC Treatments / Results  Labs (all labs ordered are listed, but only abnormal results are displayed) Labs Reviewed - No data to display  EKG None  Radiology No results found.  Procedures Procedures (including critical care time)  Medications Ordered in UC Medications - No data to display  Initial Impression / Assessment and Plan / UC Course  I have reviewed the triage vital signs and the nursing notes.  Pertinent labs & imaging results that were available during my care of the patient were reviewed by me and considered in my medical decision making (see chart for details).    Final Clinical Impressions(s) / UC Diagnoses   Final diagnoses:  Bronchitis     Discharge Instructions     Return if not improving    ED Prescriptions    Medication Sig Dispense Auth. Provider   benzonatate (TESSALON) 100 MG capsule Take 1-2 capsules (100-200 mg total) by mouth 3 (three) times daily as needed for cough. 40 capsule Elvina SidleLauenstein, Johnjoseph Rolfe, MD   predniSONE (DELTASONE) 50 MG tablet One daily with food 5 tablet Elvina SidleLauenstein, Naseer Hearn, MD   amoxicillin (AMOXIL) 875 MG tablet Take 1 tablet (875 mg total) by mouth 2 (two) times daily. 20 tablet Elvina SidleLauenstein, Isabellah Sobocinski, MD     Controlled Substance Prescriptions [place] Controlled Substance Registry consulted? Not Applicable   Elvina SidleLauenstein, Kyrell Ruacho, MD 01/13/19 1151

## 2019-01-13 NOTE — Discharge Instructions (Addendum)
Return if not improving

## 2019-01-14 ENCOUNTER — Encounter: Payer: Self-pay | Admitting: Family Medicine

## 2019-01-20 ENCOUNTER — Other Ambulatory Visit: Payer: Self-pay

## 2019-01-20 ENCOUNTER — Ambulatory Visit (INDEPENDENT_AMBULATORY_CARE_PROVIDER_SITE_OTHER): Payer: Commercial Managed Care - PPO | Admitting: Family Medicine

## 2019-01-20 ENCOUNTER — Encounter: Payer: Self-pay | Admitting: Family Medicine

## 2019-01-20 VITALS — BP 110/72 | HR 109 | Temp 98.4°F | Ht 62.0 in | Wt 189.2 lb

## 2019-01-20 DIAGNOSIS — Z0000 Encounter for general adult medical examination without abnormal findings: Secondary | ICD-10-CM | POA: Diagnosis not present

## 2019-01-20 NOTE — Progress Notes (Signed)
New Patient Office Visit  Subjective:  Patient ID: Karen MooreClarisa Burnett, female    DOB: 10/05/1996  Age: 23 y.o. MRN: [id number]  CC:  Chief Complaint  Patient presents with  . Annual Exam    HPI Karen MooreClarisa Matus presents for her annual wellness exam.  She reports that she is in good health and feels that her anxiety is currently well controlled.  She would like to continue setting goals to improve her overall nutrition and fitness.  She reports that she has been implementing many of the changes that we discussed at her last visit.  She has been eating less fast food, preparing most meals at home, drinking mostly water, and drinking no soda.  She has obviously read the nutritional materials provided to her at the last visit and is trying to apply as much as she could.  She did feel mild frustration because she appears to have gained 4 pounds from our most recent visit.  In addition to her nutritional changes she makes an effort to walk outside at lunchtime and take the stairs to get a little more exercise.  Past Medical History:  Diagnosis Date  . Anxiety   . Ultrasound recheck of fetal pyelectasis, antepartum 05/24/2017  . Vaginal Pap smear, abnormal 10/2016   ASCUS, HPV neg so needs routine screen    Past Surgical History:  Procedure Laterality Date  . NO PAST SURGERIES      Family History  Problem Relation Age of Onset  . Thyroid disease Mother   . Diabetes Father   . Arthritis Father   . Psoriasis Father   . Psoriasis Brother   . Diabetes Maternal Aunt   . Diabetes Maternal Uncle   . Diabetes Paternal Aunt   . Diabetes Paternal Uncle   . Diabetes Maternal Grandmother   . Diabetes Maternal Grandfather   . Hypertension Maternal Grandfather   . Diabetes Paternal Grandmother   . Diabetes Paternal Grandfather     Social History   Socioeconomic History  . Marital status: Single    Spouse name: Not on file  . Number of children: Not on file  . Years of education: Not on file   . Highest education level: Not on file  Occupational History  . Not on file  Social Needs  . Financial resource strain: Not on file  . Food insecurity:    Worry: Not on file    Inability: Not on file  . Transportation needs:    Medical: Not on file    Non-medical: Not on file  Tobacco Use  . Smoking status: Never Smoker  . Smokeless tobacco: Never Used  Substance and Sexual Activity  . Alcohol use: No    Alcohol/week: 0.0 standard drinks  . Drug use: No  . Sexual activity: Yes    Comment: ist intercourse- 18, partners- 1  Lifestyle  . Physical activity:    Days per week: Not on file    Minutes per session: Not on file  . Stress: Not on file  Relationships  . Social connections:    Talks on phone: Not on file    Gets together: Not on file    Attends religious service: Not on file    Active member of club or organization: Not on file    Attends meetings of clubs or organizations: Not on file    Relationship status: Not on file  . Intimate partner violence:    Fear of current or ex partner: Not on file  Emotionally abused: Not on file    Physically abused: Not on file    Forced sexual activity: Not on file  Other Topics Concern  . Not on file  Social History Narrative   Lives in [place]    Hobbies: sleep, soccer. Student at [employer]     ROS   Objective:   Today's Vitals: BP 110/72   Pulse (!) 109   Temp 98.4 F (36.9 C) (Oral)   Ht 5\' 2"  (1.575 m)   Wt 189 lb 3.2 oz (85.8 kg)   SpO2 99%   BMI 34.61 kg/m   Physical Exam Constitutional:      Appearance: Normal appearance. She is obese.  HENT:     Nose: Nose normal.     Mouth/Throat:     Mouth: Mucous membranes are moist.     Pharynx: Oropharynx is clear.  Eyes:     Extraocular Movements: Extraocular movements intact.     Conjunctiva/sclera: Conjunctivae normal.     Pupils: Pupils are equal, round, and reactive to light.  Neck:     Musculoskeletal: Normal range of motion and neck supple.    Cardiovascular:     Rate and Rhythm: Normal rate and regular rhythm.     Pulses: Normal pulses.     Heart sounds: Normal heart sounds. No murmur. No gallop.   Pulmonary:     Effort: Pulmonary effort is normal.     Breath sounds: Normal breath sounds.  Abdominal:     General: Abdomen is flat. Bowel sounds are normal.     Palpations: Abdomen is soft.  Musculoskeletal: Normal range of motion.  Skin:    General: Skin is warm.  Neurological:     General: No focal deficit present.     Mental Status: She is alert. Mental status is at baseline.  Psychiatric:        Mood and Affect: Mood normal.        Behavior: Behavior normal.        Thought Content: Thought content normal.        Judgment: Judgment normal.     Assessment & Plan:   Vaccinations: Up-to-date Pap smear: Due 10/2019 Nutrition: Clears that has already been implementing some of the changes that we talked about during her last visit.  She has made an effort to cook more meals at home, eat less fast food, drink primarily water, she would like to improve her breakfast routine which often consists of sweet breakfast food that work (honey bun).  She plans to make healthier options easily available (bananas, apples, yogurt).  So that she could easily take them with her to work or eat the mom ago as she does not typically time for breakfast preparation in the morning. Anxiety: Follow-up on 2/13, FMLA paperwork re-faxed. Recovering from recent viral illness: Continue to assess at next visit on 2/13.  Follow-up: Return in about 1 year (around 01/21/2020).   Mirian Mo, MD

## 2019-01-20 NOTE — Patient Instructions (Addendum)
It was so nice to see you today. It sounds like we're in a good spot with your healthcare.  Here are some quick notes from this visit.  Nutrition/healthy lifestyle: -You've done a great job making changes from our last visit.  Keep it up! -Let's focus on making breakfast a little healthier.  Fruit or yogurt for breakfast would be a healthy option.  Whatever changes would make it easy for you to choose something easy and healthy.  Recovering from bronchitis: -lets talk about this again when I see you in 10 days.  If we haven't see significant improvement by then, we can try medication.   Health Maintenance, Female Adopting a healthy lifestyle and getting preventive care can go a long way to promote health and wellness. Talk with your health care provider about what schedule of regular examinations is right for you. This is a good chance for you to check in with your provider about disease prevention and staying healthy. In between checkups, there are plenty of things you can do on your own. Experts have done a lot of research about which lifestyle changes and preventive measures are most likely to keep you healthy. Ask your health care provider for more information. Weight and diet Eat a healthy diet  Be sure to include plenty of vegetables, fruits, low-fat dairy products, and lean protein.  Do not eat a lot of foods high in solid fats, added sugars, or salt.  Get regular exercise. This is one of the most important things you can do for your health. ? Most adults should exercise for at least 150 minutes each week. The exercise should increase your heart rate and make you sweat (moderate-intensity exercise). ? Most adults should also do strengthening exercises at least twice a week. This is in addition to the moderate-intensity exercise. Maintain a healthy weight  Body mass index (BMI) is a measurement that can be used to identify possible weight problems. It estimates body fat based on height  and weight. Your health care provider can help determine your BMI and help you achieve or maintain a healthy weight.  For females 64 years of age and older: ? A BMI below 18.5 is considered underweight. ? A BMI of 18.5 to 24.9 is normal. ? A BMI of 25 to 29.9 is considered overweight. ? A BMI of 30 and above is considered obese. Watch levels of cholesterol and blood lipids  You should start having your blood tested for lipids and cholesterol at 25 years of age, then have this test every 5 years.  You may need to have your cholesterol levels checked more often if: ? Your lipid or cholesterol levels are high. ? You are older than 25 years of age. ? You are at high risk for heart disease. Cancer screening Lung Cancer  Lung cancer screening is recommended for adults 29-44 years old who are at high risk for lung cancer because of a history of smoking.  A yearly low-dose CT scan of the lungs is recommended for people who: ? Currently smoke. ? Have quit within the past 15 years. ? Have at least a 30-pack-year history of smoking. A pack year is smoking an average of one pack of cigarettes a day for 1 year.  Yearly screening should continue until it has been 15 years since you quit.  Yearly screening should stop if you develop a health problem that would prevent you from having lung cancer treatment. Breast Cancer  Practice breast self-awareness. This means understanding  how your breasts normally appear and feel.  It also means doing regular breast self-exams. Let your health care provider know about any changes, no matter how small.  If you are in your 20s or 30s, you should have a clinical breast exam (CBE) by a health care provider every 1-3 years as part of a regular health exam.  If you are 76 or older, have a CBE every year. Also consider having a breast X-ray (mammogram) every year.  If you have a family history of breast cancer, talk to your health care provider about genetic  screening.  If you are at high risk for breast cancer, talk to your health care provider about having an MRI and a mammogram every year.  Breast cancer gene (BRCA) assessment is recommended for women who have family members with BRCA-related cancers. BRCA-related cancers include: ? Breast. ? Ovarian. ? Tubal. ? Peritoneal cancers.  Results of the assessment will determine the need for genetic counseling and BRCA1 and BRCA2 testing. Cervical Cancer Your health care provider may recommend that you be screened regularly for cancer of the pelvic organs (ovaries, uterus, and vagina). This screening involves a pelvic examination, including checking for microscopic changes to the surface of your cervix (Pap test). You may be encouraged to have this screening done every 3 years, beginning at age 84.  For women ages 60-65, health care providers may recommend pelvic exams and Pap testing every 3 years, or they may recommend the Pap and pelvic exam, combined with testing for human papilloma virus (HPV), every 5 years. Some types of HPV increase your risk of cervical cancer. Testing for HPV may also be done on women of any age with unclear Pap test results.  Other health care providers may not recommend any screening for nonpregnant women who are considered low risk for pelvic cancer and who do not have symptoms. Ask your health care provider if a screening pelvic exam is right for you.  If you have had past treatment for cervical cancer or a condition that could lead to cancer, you need Pap tests and screening for cancer for at least 20 years after your treatment. If Pap tests have been discontinued, your risk factors (such as having a new sexual partner) need to be reassessed to determine if screening should resume. Some women have medical problems that increase the chance of getting cervical cancer. In these cases, your health care provider may recommend more frequent screening and Pap tests. Colorectal  Cancer  This type of cancer can be detected and often prevented.  Routine colorectal cancer screening usually begins at 25 years of age and continues through 25 years of age.  Your health care provider may recommend screening at an earlier age if you have risk factors for colon cancer.  Your health care provider may also recommend using home test kits to check for hidden blood in the stool.  A small camera at the end of a tube can be used to examine your colon directly (sigmoidoscopy or colonoscopy). This is done to check for the earliest forms of colorectal cancer.  Routine screening usually begins at age 46.  Direct examination of the colon should be repeated every 5-10 years through 25 years of age. However, you may need to be screened more often if early forms of precancerous polyps or small growths are found. Skin Cancer  Check your skin from head to toe regularly.  Tell your health care provider about any new moles or changes in moles,  especially if there is a change in a mole's shape or color.  Also tell your health care provider if you have a mole that is larger than the size of a pencil eraser.  Always use sunscreen. Apply sunscreen liberally and repeatedly throughout the day.  Protect yourself by wearing long sleeves, pants, a wide-brimmed hat, and sunglasses whenever you are outside. Heart disease, diabetes, and high blood pressure  High blood pressure causes heart disease and increases the risk of stroke. High blood pressure is more likely to develop in: ? People who have blood pressure in the high end of the normal range (130-139/85-89 mm Hg). ? People who are overweight or obese. ? People who are African American.  If you are 64-32 years of age, have your blood pressure checked every 3-5 years. If you are 33 years of age or older, have your blood pressure checked every year. You should have your blood pressure measured twice-once when you are at a hospital or clinic,  and once when you are not at a hospital or clinic. Record the average of the two measurements. To check your blood pressure when you are not at a hospital or clinic, you can use: ? An automated blood pressure machine at a pharmacy. ? A home blood pressure monitor.  If you are between 52 years and 66 years old, ask your health care provider if you should take aspirin to prevent strokes.  Have regular diabetes screenings. This involves taking a blood sample to check your fasting blood sugar level. ? If you are at a normal weight and have a low risk for diabetes, have this test once every three years after 25 years of age. ? If you are overweight and have a high risk for diabetes, consider being tested at a younger age or more often. Preventing infection Hepatitis B  If you have a higher risk for hepatitis B, you should be screened for this virus. You are considered at high risk for hepatitis B if: ? You were born in a country where hepatitis B is common. Ask your health care provider which countries are considered high risk. ? Your parents were born in a high-risk country, and you have not been immunized against hepatitis B (hepatitis B vaccine). ? You have HIV or AIDS. ? You use needles to inject street drugs. ? You live with someone who has hepatitis B. ? You have had sex with someone who has hepatitis B. ? You get hemodialysis treatment. ? You take certain medicines for conditions, including cancer, organ transplantation, and autoimmune conditions. Hepatitis C  Blood testing is recommended for: ? Everyone born from [birth year] through 1967. ? Anyone with known risk factors for hepatitis C. Sexually transmitted infections (STIs)  You should be screened for sexually transmitted infections (STIs) including gonorrhea and chlamydia if: ? You are sexually active and are younger than 25 years of age. ? You are older than 25 years of age and your health care provider tells you that you are at risk  for this type of infection. ? Your sexual activity has changed since you were last screened and you are at an increased risk for chlamydia or gonorrhea. Ask your health care provider if you are at risk.  If you do not have HIV, but are at risk, it may be recommended that you take a prescription medicine daily to prevent HIV infection. This is called pre-exposure prophylaxis (PrEP). You are considered at risk if: ? You are sexually active and do  not regularly use condoms or know the HIV status of your partner(s). ? You take drugs by injection. ? You are sexually active with a partner who has HIV. Talk with your health care provider about whether you are at high risk of being infected with HIV. If you choose to begin PrEP, you should first be tested for HIV. You should then be tested every 3 months for as long as you are taking PrEP. Pregnancy  If you are premenopausal and you may become pregnant, ask your health care provider about preconception counseling.  If you may become pregnant, take 400 to 800 micrograms (mcg) of folic acid every day.  If you want to prevent pregnancy, talk to your health care provider about birth control (contraception). Osteoporosis and menopause  Osteoporosis is a disease in which the bones lose minerals and strength with aging. This can result in serious bone fractures. Your risk for osteoporosis can be identified using a bone density scan.  If you are 24 years of age or older, or if you are at risk for osteoporosis and fractures, ask your health care provider if you should be screened.  Ask your health care provider whether you should take a calcium or vitamin D supplement to lower your risk for osteoporosis.  Menopause may have certain physical symptoms and risks.  Hormone replacement therapy may reduce some of these symptoms and risks. Talk to your health care provider about whether hormone replacement therapy is right for you. Follow these instructions at  home:  Schedule regular health, dental, and eye exams.  Stay current with your immunizations.  Do not use any tobacco products including cigarettes, chewing tobacco, or electronic cigarettes.  If you are pregnant, do not drink alcohol.  If you are breastfeeding, limit how much and how often you drink alcohol.  Limit alcohol intake to no more than 1 drink per day for nonpregnant women. One drink equals 12 ounces of beer, 5 ounces of wine, or 1 ounces of hard liquor.  Do not use street drugs.  Do not share needles.  Ask your health care provider for help if you need support or information about quitting drugs.  Tell your health care provider if you often feel depressed.  Tell your health care provider if you have ever been abused or do not feel safe at home. This information is not intended to replace advice given to you by your health care provider. Make sure you discuss any questions you have with your health care provider. Document Released: 06/19/2011 Document Revised: 05/11/2016 Document Reviewed: 09/07/2015 Elsevier Interactive Patient Education  2019 [place] Maintenance, Female Adopting a healthy lifestyle and getting preventive care can go a long way to promote health and wellness. Talk with your health care provider about what schedule of regular examinations is right for you. This is a good chance for you to check in with your provider about disease prevention and staying healthy. In between checkups, there are plenty of things you can do on your own. Experts have done a lot of research about which lifestyle changes and preventive measures are most likely to keep you healthy. Ask your health care provider for more information. Weight and diet Eat a healthy diet  Be sure to include plenty of vegetables, fruits, low-fat dairy products, and lean protein.  Do not eat a lot of foods high in solid fats, added sugars, or salt.  Get regular exercise. This is one  of the most important things you can  do for your health. ? Most adults should exercise for at least 150 minutes each week. The exercise should increase your heart rate and make you sweat (moderate-intensity exercise). ? Most adults should also do strengthening exercises at least twice a week. This is in addition to the moderate-intensity exercise. Maintain a healthy weight  Body mass index (BMI) is a measurement that can be used to identify possible weight problems. It estimates body fat based on height and weight. Your health care provider can help determine your BMI and help you achieve or maintain a healthy weight.  For females 39 years of age and older: ? A BMI below 18.5 is considered underweight. ? A BMI of 18.5 to 24.9 is normal. ? A BMI of 25 to 29.9 is considered overweight. ? A BMI of 30 and above is considered obese. Watch levels of cholesterol and blood lipids  You should start having your blood tested for lipids and cholesterol at 25 years of age, then have this test every 5 years.  You may need to have your cholesterol levels checked more often if: ? Your lipid or cholesterol levels are high. ? You are older than 25 years of age. ? You are at high risk for heart disease. Cancer screening Lung Cancer  Lung cancer screening is recommended for adults 14-70 years old who are at high risk for lung cancer because of a history of smoking.  A yearly low-dose CT scan of the lungs is recommended for people who: ? Currently smoke. ? Have quit within the past 15 years. ? Have at least a 30-pack-year history of smoking. A pack year is smoking an average of one pack of cigarettes a day for 1 year.  Yearly screening should continue until it has been 15 years since you quit.  Yearly screening should stop if you develop a health problem that would prevent you from having lung cancer treatment. Breast Cancer  Practice breast self-awareness. This means understanding how your breasts  normally appear and feel.  It also means doing regular breast self-exams. Let your health care provider know about any changes, no matter how small.  If you are 40 or older, have a CBE every year. Also consider having a breast X-ray (mammogram) every year.  If you have a family history of breast cancer, talk to your health care provider about genetic screening.  If you are at high risk for breast cancer, talk to your health care provider about having an MRI and a mammogram every year.  Breast cancer gene (BRCA) assessment is recommended for women who have family members with BRCA-related cancers. BRCA-related cancers include: ? Breast. ? Ovarian. ? Tubal. ? Peritoneal cancers.  Results of the assessment will determine the need for genetic counseling and BRCA1 and BRCA2 testing. Cervical Cancer Your health care provider may recommend that you be screened regularly for cancer of the pelvic organs (ovaries, uterus, and vagina). This screening involves a pelvic examination, including checking for microscopic changes to the surface of your cervix (Pap test). You may be encouraged to have this screening done every 3 years, beginning at age 41.  For women ages 78-65, health care providers may recommend pelvic exams and Pap testing every 3 years, or they may recommend the Pap and pelvic exam, combined with testing for human papilloma virus (HPV), every 5 years. Some types of HPV increase your risk of cervical cancer. Testing for HPV may also be done on women of any age with unclear Pap test results.  Other health care providers may not recommend any screening for nonpregnant women who are considered low risk for pelvic cancer and who do not have symptoms. Ask your health care provider if a screening pelvic exam is right for you.  If you have had past treatment for cervical cancer or a condition that could lead to cancer, you need Pap tests and screening for cancer for at least 20 years after your  treatment. If Pap tests have been discontinued, your risk factors (such as having a new sexual partner) need to be reassessed to determine if screening should resume. Some women have medical problems that increase the chance of getting cervical cancer. In these cases, your health care provider may recommend more frequent screening and Pap tests. Colorectal Cancer  This type of cancer can be detected and often prevented.  Routine colorectal cancer screening usually begins at 25 years of age and continues through 25 years of age.  Your health care provider may recommend screening at an earlier age if you have risk factors for colon cancer.  Your health care provider may also recommend using home test kits to check for hidden blood in the stool.  A small camera at the end of a tube can be used to examine your colon directly (sigmoidoscopy or colonoscopy). This is done to check for the earliest forms of colorectal cancer.  Routine screening usually begins at age 68.  Direct examination of the colon should be repeated every 5-10 years through 25 years of age. However, you may need to be screened more often if early forms of precancerous polyps or small growths are found. Skin Cancer  Check your skin from head to toe regularly.  Tell your health care provider about any new moles or changes in moles, especially if there is a change in a mole's shape or color.  Also tell your health care provider if you have a mole that is larger than the size of a pencil eraser.  Always use sunscreen. Apply sunscreen liberally and repeatedly throughout the day.  Protect yourself by wearing long sleeves, pants, a wide-brimmed hat, and sunglasses whenever you are outside. Heart disease, diabetes, and high blood pressure  High blood pressure causes heart disease and increases the risk of stroke. High blood pressure is more likely to develop in: ? People who have blood pressure in the high end of the normal range  (130-139/85-89 mm Hg). ? People who are overweight or obese. ? People who are African American.  If you are 59-51 years of age, have your blood pressure checked every 3-5 years. If you are 26 years of age or older, have your blood pressure checked every year. You should have your blood pressure measured twice-once when you are at a hospital or clinic, and once when you are not at a hospital or clinic. Record the average of the two measurements. To check your blood pressure when you are not at a hospital or clinic, you can use: ? An automated blood pressure machine at a pharmacy. ? A home blood pressure monitor.  If you are between 23 years and 75 years old, ask your health care provider if you should take aspirin to prevent strokes.  Have regular diabetes screenings. This involves taking a blood sample to check your fasting blood sugar level. ? If you are at a normal weight and have a low risk for diabetes, have this test once every three years after 25 years of age. ? If you are overweight and  have a high risk for diabetes, consider being tested at a younger age or more often. Preventing infection Hepatitis B  If you have a higher risk for hepatitis B, you should be screened for this virus. You are considered at high risk for hepatitis B if: ? You were born in a country where hepatitis B is common. Ask your health care provider which countries are considered high risk. ? Your parents were born in a high-risk country, and you have not been immunized against hepatitis B (hepatitis B vaccine). ? You have HIV or AIDS. ? You use needles to inject street drugs. ? You live with someone who has hepatitis B. ? You have had sex with someone who has hepatitis B. ? You get hemodialysis treatment. ? You take certain medicines for conditions, including cancer, organ transplantation, and autoimmune conditions. Hepatitis C  Blood testing is recommended for: ? Everyone born from [birth year] through  1967. ? Anyone with known risk factors for hepatitis C. Sexually transmitted infections (STIs)  You should be screened for sexually transmitted infections (STIs) including gonorrhea and chlamydia if: ? You are sexually active and are younger than 25 years of age. ? You are older than 25 years of age and your health care provider tells you that you are at risk for this type of infection. ? Your sexual activity has changed since you were last screened and you are at an increased risk for chlamydia or gonorrhea. Ask your health care provider if you are at risk.  If you do not have HIV, but are at risk, it may be recommended that you take a prescription medicine daily to prevent HIV infection. This is called pre-exposure prophylaxis (PrEP). You are considered at risk if: ? You are sexually active and do not regularly use condoms or know the HIV status of your partner(s). ? You take drugs by injection. ? You are sexually active with a partner who has HIV. Talk with your health care provider about whether you are at high risk of being infected with HIV. If you choose to begin PrEP, you should first be tested for HIV. You should then be tested every 3 months for as long as you are taking PrEP. Pregnancy  If you are premenopausal and you may become pregnant, ask your health care provider about preconception counseling.  If you may become pregnant, take 400 to 800 micrograms (mcg) of folic acid every day.  If you want to prevent pregnancy, talk to your health care provider about birth control (contraception). Osteoporosis and menopause  Osteoporosis is a disease in which the bones lose minerals and strength with aging. This can result in serious bone fractures. Your risk for osteoporosis can be identified using a bone density scan.  If you are 43 years of age or older, or if you are at risk for osteoporosis and fractures, ask your health care provider if you should be screened.  Ask your health  care provider whether you should take a calcium or vitamin D supplement to lower your risk for osteoporosis.  Menopause may have certain physical symptoms and risks.  Hormone replacement therapy may reduce some of these symptoms and risks. Talk to your health care provider about whether hormone replacement therapy is right for you. Follow these instructions at home:  Schedule regular health, dental, and eye exams.  Stay current with your immunizations.  Do not use any tobacco products including cigarettes, chewing tobacco, or electronic cigarettes.  If you are pregnant, do not drink  alcohol.  If you are breastfeeding, limit how much and how often you drink alcohol.  Limit alcohol intake to no more than 1 drink per day for nonpregnant women. One drink equals 12 ounces of beer, 5 ounces of wine, or 1 ounces of hard liquor.  Do not use street drugs.  Do not share needles.  Ask your health care provider for help if you need support or information about quitting drugs.  Tell your health care provider if you often feel depressed.  Tell your health care provider if you have ever been abused or do not feel safe at home. This information is not intended to replace advice given to you by your health care provider. Make sure you discuss any questions you have with your health care provider. Document Released: 06/19/2011 Document Revised: 05/11/2016 Document Reviewed: 09/07/2015 Elsevier Interactive Patient Education  2019 [employer].

## 2019-01-21 NOTE — Addendum Note (Signed)
Addended by: Manson Passey, CARINA on: 01/21/2019 08:42 AM   Modules accepted: Level of Service

## 2019-01-30 ENCOUNTER — Encounter: Payer: Self-pay | Admitting: Family Medicine

## 2019-01-30 ENCOUNTER — Ambulatory Visit (INDEPENDENT_AMBULATORY_CARE_PROVIDER_SITE_OTHER): Payer: Commercial Managed Care - PPO | Admitting: Family Medicine

## 2019-01-30 ENCOUNTER — Other Ambulatory Visit: Payer: Self-pay

## 2019-01-30 DIAGNOSIS — F411 Generalized anxiety disorder: Secondary | ICD-10-CM | POA: Diagnosis not present

## 2019-01-30 MED ORDER — SPACER/AERO-HOLDING CHAMBERS DEVI: 1.0000 | Freq: Once | 0 refills | Status: AC

## 2019-01-30 MED ORDER — ALBUTEROL SULFATE HFA 108 (90 BASE) MCG/ACT IN AERS: 2.0000 | INHALATION_SPRAY | RESPIRATORY_TRACT | 0 refills | Status: DC | PRN

## 2019-01-30 NOTE — Assessment & Plan Note (Signed)
Anxiety appears well controlled on the current medication.  It does sound like she is experiencing significant stressors in her life with her current relationship.  Like to keep her on her current dose of sertraline and encouraged her to follow-up with behavioral health for several appointments for anxiety. -Continue sertraline 150 mg -We will message behavioral health about follow-up appointments

## 2019-01-30 NOTE — Progress Notes (Signed)
    Subjective:  Karen Burnett is a 23 y.o. female who presents to the [facility] today to follow-up for anxiety and bronchitis.   HPI: Anxiety  Since our last visit, she has been taking Zoloft 150 mg daily.  She thinks that overall she is doing better regarding anxiety.  She also sounds very optimistic about a recent job interview and feels that work is going well.  Conversely, her relationship with her significant other, Sheria Lang, is turbulent.  There currently separated and attending couples counseling.  Her GAD 7 score in the office today is 7.   Ongoing cough for 6 weeks She reports that she was diagnosed with bronchitis about 6 weeks ago.  Since that time she has had an ongoing cough that seems to be worse at night and in the mornings.  It does not interrupt her sleep.  She noted that she is also noticed intermittent wheezing.  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  She also noted that she has a history of asthma but has not had any issue with it since she was about 23 years old.  Lifestyle/nutrition/weight She reports that she has been successful in implementing changes in her breakfast routine and now has kept a full apples and bananas for the morning.  She was excited to find that she had lost 2 pounds today compared to her visit 10 days ago.  Chief Complaint noted Review of Symptoms - see HPI PMH - Smoking status noted.    Objective:  Physical Exam: BP 108/70   Pulse 99   Temp 98.7 F (37.1 C) (Oral)   Ht 5\' 2"  (1.575 m)   Wt 187 lb 3.2 oz (84.9 kg)   SpO2 98%   BMI 34.24 kg/m    Gen: Sitting comfortably on the exam table, no acute distress.  Put together, nicely dressed.  In good spirits. Pulm: Breathing comfortably on room air.  No wheezing appreciated on auscultation. Psych: Normal affect and thought content  No results found for this or any previous  visit (from the past 72 hour(s)).   Assessment/Plan:  Generalized anxiety disorder Anxiety appears well controlled on the current medication.  It does sound like she is experiencing significant stressors in her life with her current relationship.  Like to keep her on her current dose of sertraline and encouraged her to follow-up with behavioral health for several appointments for anxiety. -Continue sertraline 150 mg -We will message behavioral health about follow-up appointments  Post viral cough His cough is been going on for roughly 6 weeks and is most likely due to a slow recovery from a viral illness this season.  He does not think any additional work-up is warranted at this time nor will begin a controller medication for past diagnosis of asthma.  I will provide an albuterol inhaler to be used as needed if she finds it helpful. -Albuterol with spacer every 4 hours as needed -Recommended any OTC medication with dextromethorphan

## 2019-01-30 NOTE — Patient Instructions (Addendum)
It was great to see you today! I'm glad that the breakfast goals are going well.  It sounds like your anxiety is well controlled for now.  I would like to keep your Zoloft where it is.    Cough - I put in orders for albuterol and a spacer, you don't have to use them or buy them. It's there if you want. - For the cough, I would try any over-the-counter medication with dextromethorphan (it's a cough suppressant). It does not require a prescription.

## 2019-01-31 ENCOUNTER — Telehealth: Payer: Self-pay | Admitting: Licensed Clinical Social Worker

## 2019-01-31 NOTE — Telephone Encounter (Signed)
   Telephone Outreach Note  01/31/2019 Name: Karen Burnett MRN: [id number] DOB: 04/11/1996  Referred by: PCP, Dr.Frank to assistance patient with managing anxiety   Reason for referral : to schedule Appointment ([facility] ) Review of patient status,  including review of consultants,  was performed as part of comprehensive patient evaluation.   LCSW called [name] today to schedule appointment with [facility]. Verified demographics  Follow Up Plan: Appointment scheduled for SW to meet with client in provider office on:  Feb. 21st at 3:30  Karen Mo, MD has been notified of this outreach and [name]'s decision and plan.   Sammuel Hines, LCSW [facility]   [phone] 8:59 AM

## 2019-02-07 ENCOUNTER — Ambulatory Visit (INDEPENDENT_AMBULATORY_CARE_PROVIDER_SITE_OTHER): Payer: Self-pay | Admitting: Licensed Clinical Social Worker

## 2019-02-07 DIAGNOSIS — F411 Generalized anxiety disorder: Secondary | ICD-10-CM
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
## 2019-03-07 ENCOUNTER — Encounter: Payer: Self-pay | Admitting: Family Medicine

## 2019-03-07 DIAGNOSIS — J189 Pneumonia, unspecified organism: Secondary | ICD-10-CM

## 2019-03-14 MED ORDER — AZITHROMYCIN 250 MG PO TABS: ORAL_TABLET | ORAL | 0 refills | Status: DC

## 2019-03-14 NOTE — Telephone Encounter (Signed)
Spoke with [name] on the phone and verified that she has had a persistent cough for the past 10-12 weeks.  She was seen at urgent care on 1/27 and given a course of augmentin which she completed without significant improvement.  She has been see on my clinic on 2/13 and was given albuterol for symptomatic treatment for a likely post-viral cough.  She reports that her cough has persisted and worsen in the past several weeks with more extreme symptoms at night.    She denied fevers and shortness of breath.    She was informed that we are attempting avoid exposing our patients to the clinic if we can avoid it.  This may be due to an atypical pneumonia.  She was told to complete the azythromycin (5 day course) sent to her pharmacy and call back if she did not experience any symptom improvement in the next 1-2 weeks.  She was encouraged to adhere to social distancing guidelines and avoid unnecessary social situations. enoil Mirian Mo, MD

## 2019-04-03 ENCOUNTER — Encounter: Payer: Self-pay | Admitting: Family Medicine

## 2019-04-09 ENCOUNTER — Telehealth: Payer: Self-pay | Admitting: *Deleted

## 2019-04-09 NOTE — Telephone Encounter (Signed)
Patient states that she is retuning a call to Dr. Homero Fellers from 04/04/19.  She is not sure what was needed, but is "pretty sure' it was Dr. Homero Fellers.  Will forward to him. Jone Baseman, CMA

## 2019-04-11 ENCOUNTER — Other Ambulatory Visit: Payer: Self-pay | Admitting: Family Medicine

## 2019-04-11 DIAGNOSIS — R059 Cough, unspecified: Secondary | ICD-10-CM

## 2019-04-11 DIAGNOSIS — R05 Cough: Secondary | ICD-10-CM

## 2019-04-11 MED ORDER — PANTOPRAZOLE SODIUM 20 MG PO TBEC: 20.0000 mg | DELAYED_RELEASE_TABLET | Freq: Every day | ORAL | 0 refills | Status: DC

## 2019-04-12 ENCOUNTER — Other Ambulatory Visit: Payer: Self-pay | Admitting: Family Medicine

## 2019-04-12 DIAGNOSIS — F411 Generalized anxiety disorder: Secondary | ICD-10-CM

## 2019-04-12 MED ORDER — SERTRALINE HCL 100 MG PO TABS: 150.0000 mg | ORAL_TABLET | Freq: Every day | ORAL | 2 refills | Status: DC

## 2019-04-18 ENCOUNTER — Other Ambulatory Visit: Payer: Self-pay

## 2019-04-18 ENCOUNTER — Ambulatory Visit
Admission: RE | Admit: 2019-04-18 | Discharge: 2019-04-18 | Disposition: A | Discharge status: Home / Self Care | Payer: Self-pay | Source: Ambulatory Visit | Attending: Family Medicine | Admitting: Family Medicine

## 2019-04-18 DIAGNOSIS — R05 Cough: Secondary | ICD-10-CM

## 2019-04-18 DIAGNOSIS — R059 Cough, unspecified: Secondary | ICD-10-CM

## 2019-04-24 ENCOUNTER — Other Ambulatory Visit: Payer: Self-pay | Admitting: Family Medicine

## 2019-04-24 DIAGNOSIS — R05 Cough: Secondary | ICD-10-CM

## 2019-04-24 DIAGNOSIS — R059 Cough, unspecified: Secondary | ICD-10-CM

## 2019-04-30 NOTE — Telephone Encounter (Signed)
Pt informed that her chest xray was entirely normal and showed no evidence of pneumonia or changes related to asthma.  She reported that she has noted significant improvement in her cough in the past week. This likely signifies resolution of her post viral coughing.  She was informed that she does have a protonix prescription waiting at the pharmacy if she finds that her cough has not improved.  A trial of protonix will help rule in/out possible GERD.  Mirian Mo, MD

## 2019-05-05 ENCOUNTER — Other Ambulatory Visit: Payer: Self-pay

## 2019-05-05 DIAGNOSIS — R05 Cough: Secondary | ICD-10-CM

## 2019-05-05 DIAGNOSIS — R059 Cough, unspecified: Secondary | ICD-10-CM

## 2020-01-05 ENCOUNTER — Other Ambulatory Visit: Payer: Self-pay

## 2020-01-05 DIAGNOSIS — F411 Generalized anxiety disorder: Secondary | ICD-10-CM

## 2020-01-05 MED ORDER — SERTRALINE HCL 100 MG PO TABS: 150.0000 mg | ORAL_TABLET | Freq: Every day | ORAL | 2 refills | Status: DC

## 2020-05-02 IMAGING — CR CHEST - 2 VIEW
2 series · 2 of 2 positions shown · non-contrast
Comparison: 02/15/2005

CLINICAL DATA: 22-year-old female with cough and wheeze

EXAM:
CHEST - 2 VIEW

[w chest pa]
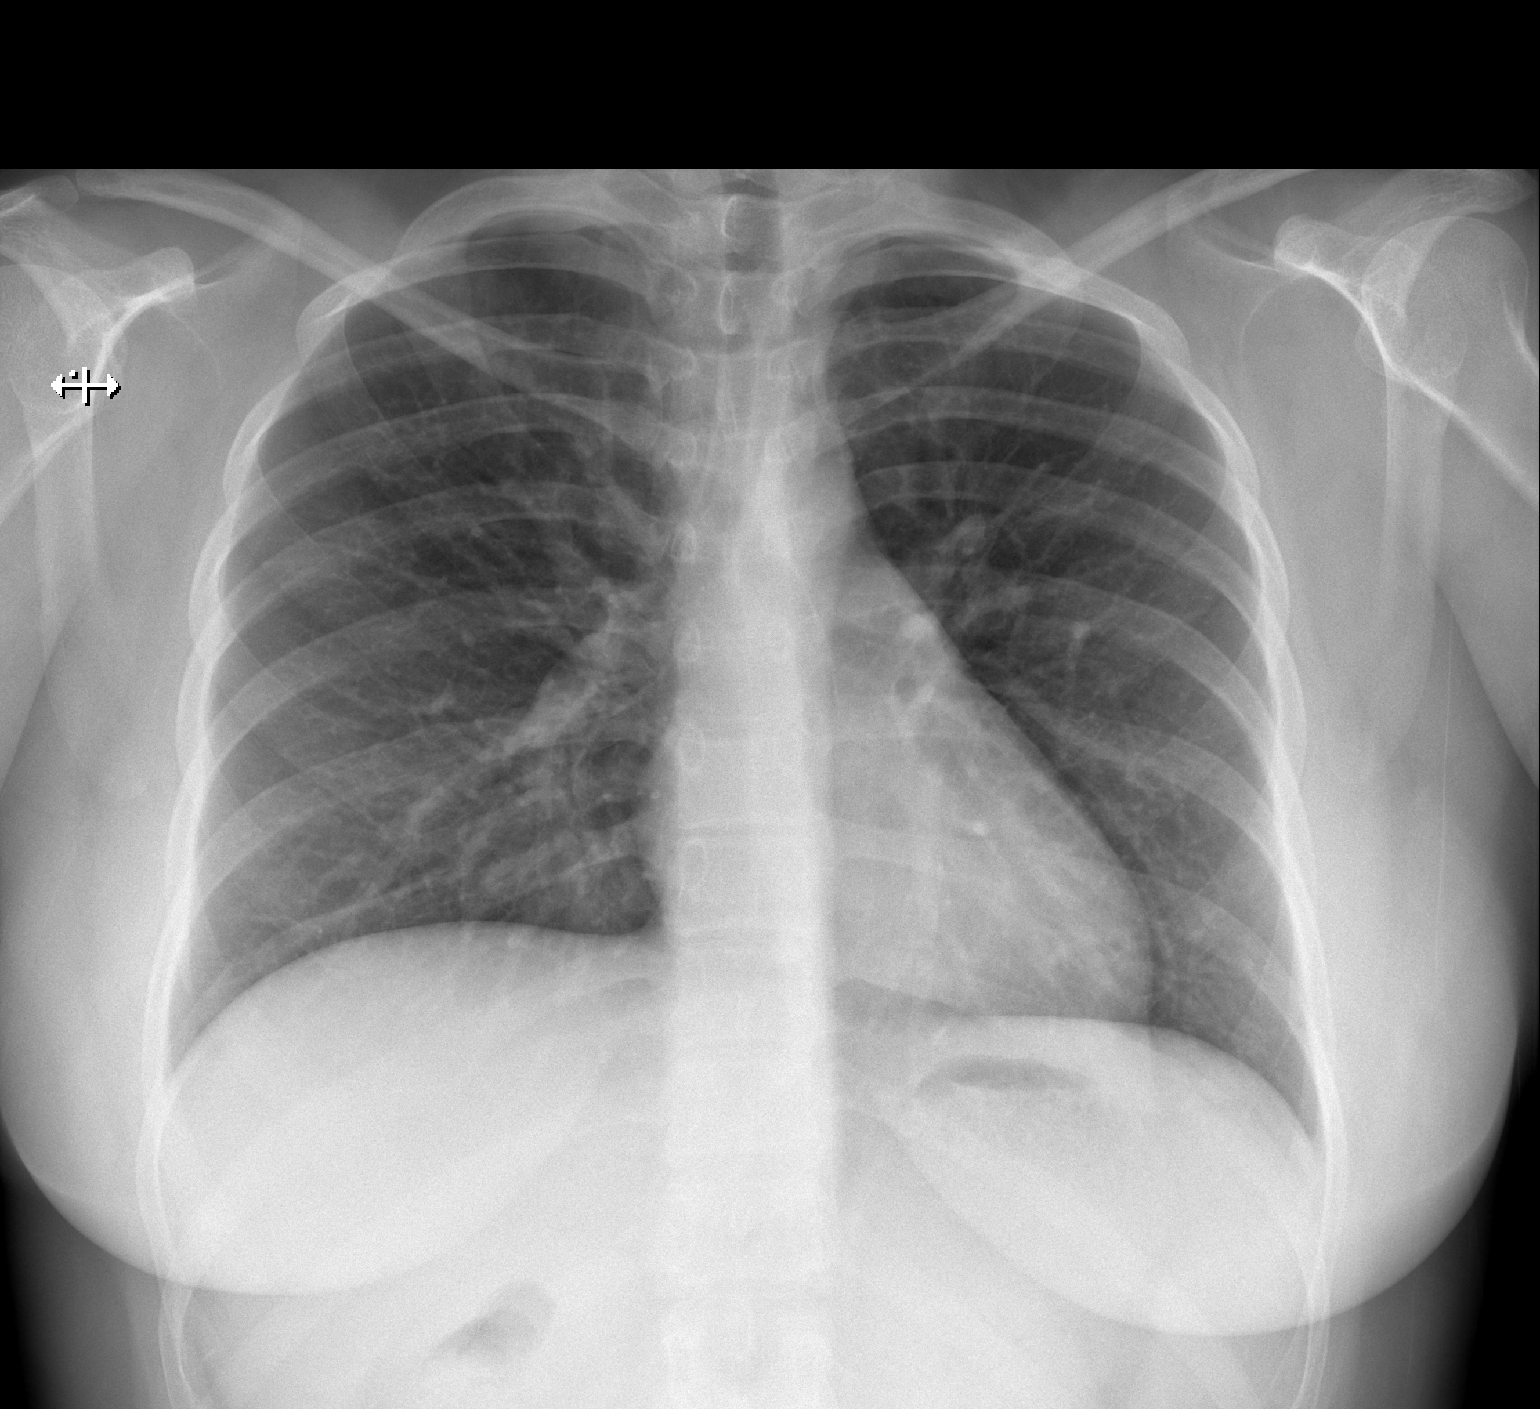

[w chest lat]
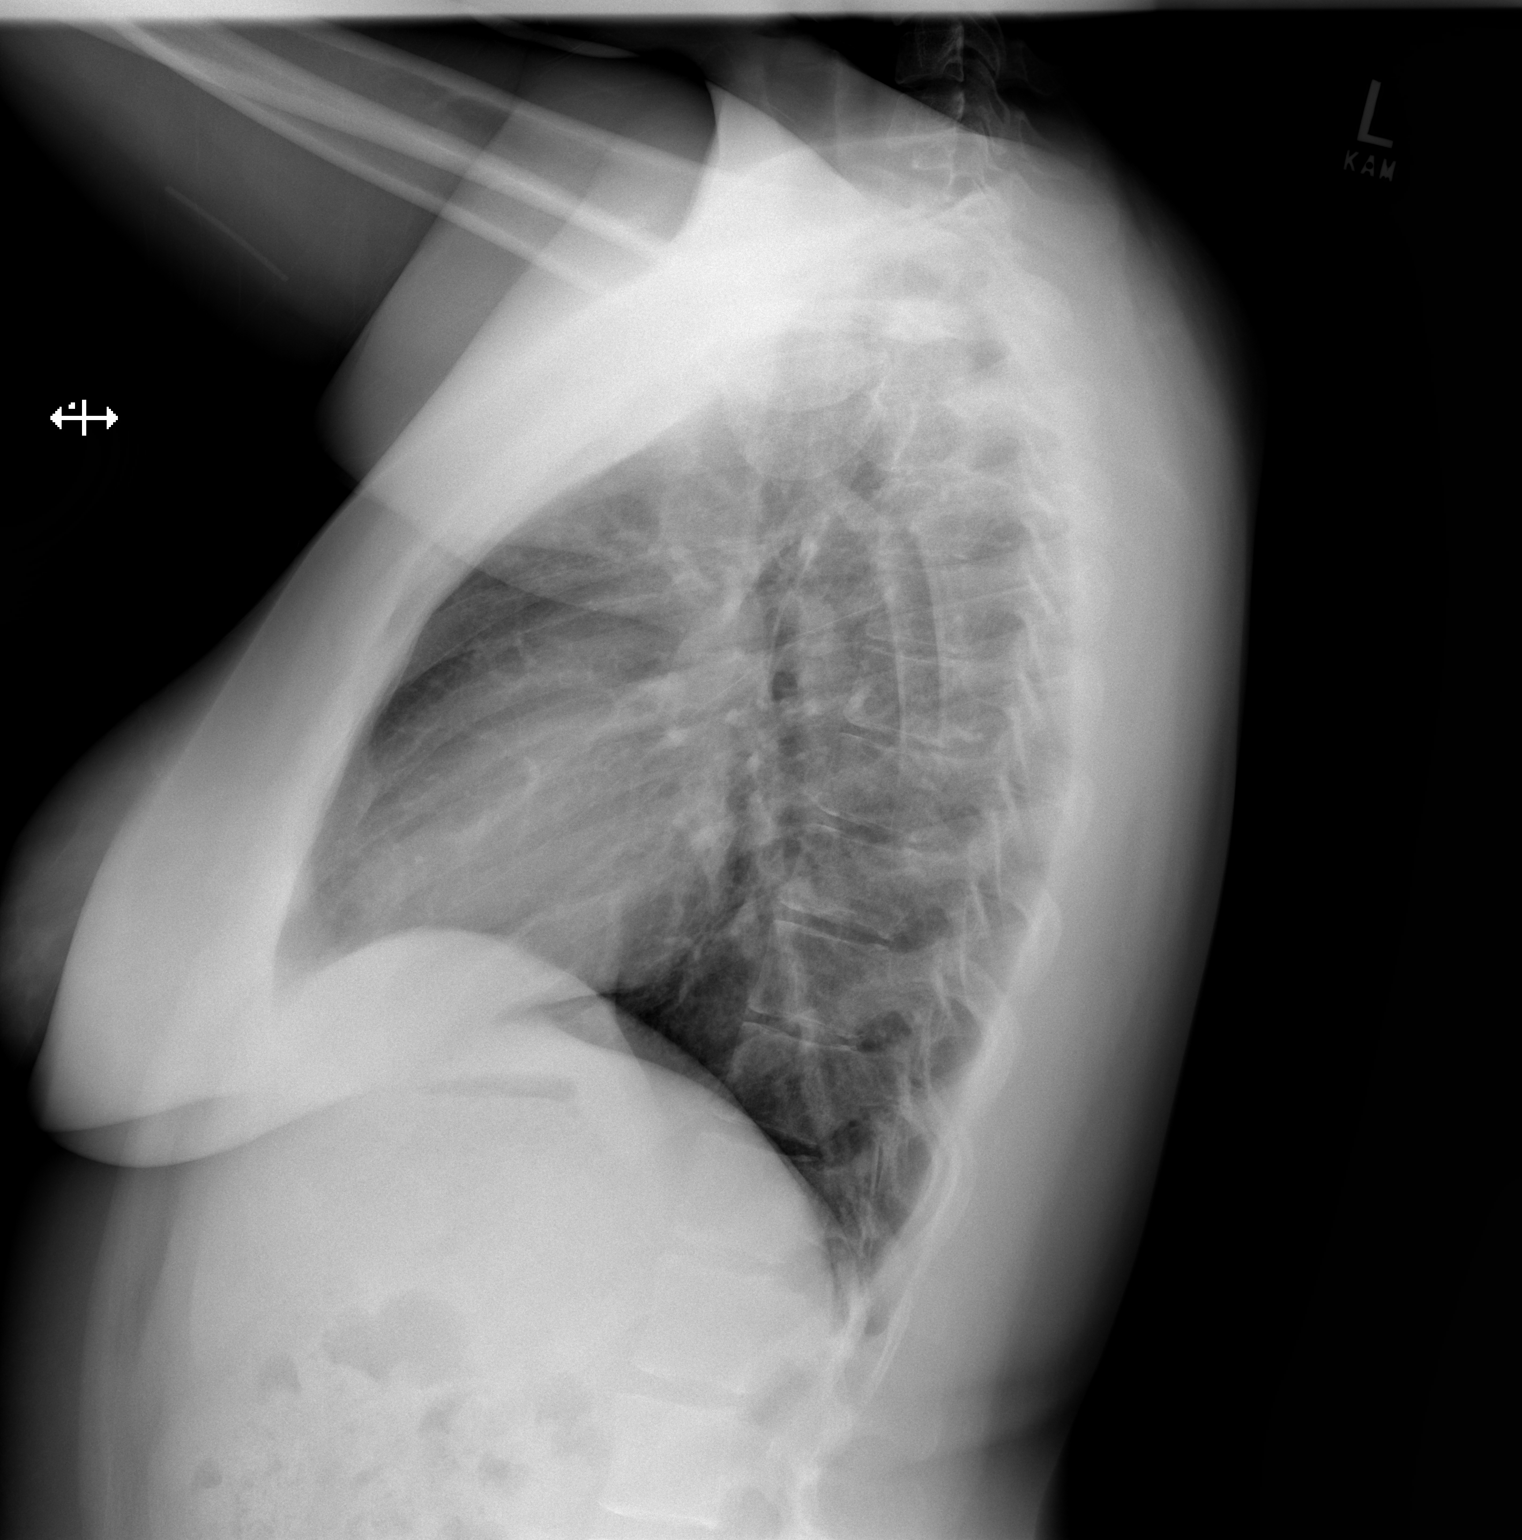

[2 of 2 positions shown; findings below may reference images not displayed]

FINDINGS: The heart size and mediastinal contours are within normal limits.
Both lungs are clear. The visualized skeletal structures are
unremarkable.
IMPRESSION: Negative for acute cardiopulmonary disease

## 2020-07-09 ENCOUNTER — Ambulatory Visit (INDEPENDENT_AMBULATORY_CARE_PROVIDER_SITE_OTHER): Payer: Commercial Managed Care - PPO | Admitting: Otolaryngology

## 2020-07-09 ENCOUNTER — Other Ambulatory Visit: Payer: Self-pay

## 2020-07-09 VITALS — Temp 97.7°F

## 2020-07-09 DIAGNOSIS — R0683 Snoring: Secondary | ICD-10-CM | POA: Diagnosis not present

## 2020-07-09 DIAGNOSIS — J3489 Other specified disorders of nose and nasal sinuses: Secondary | ICD-10-CM

## 2020-07-09 DIAGNOSIS — J310 Chronic rhinitis: Secondary | ICD-10-CM

## 2020-07-09 NOTE — Progress Notes (Signed)
HPI: Karen Burnett is a 24 y.o. female who presents for evaluation of chronic difficulty breathing through her nose.  She always feels like she has difficulty breathing through her nose sometimes worse than other times.  She has tried Flonase in the past but has not used it regularly.  She breathes better when she pulls her nostrils sideways.  Denies any yellow-green discharge from her nose. She also snores on a regular basis. She does not smoke. She is otherwise healthy on no medications.  Past Medical History:  Diagnosis Date  . Anxiety   . Ultrasound recheck of fetal pyelectasis, antepartum 05/24/2017  . Vaginal Pap smear, abnormal 10/2016   ASCUS, HPV neg so needs routine screen   Past Surgical History:  Procedure Laterality Date  . NO PAST SURGERIES     Social History   Socioeconomic History  . Marital status: Single    Spouse name: Not on file  . Number of children: Not on file  . Years of education: Not on file  . Highest education level: Not on file  Occupational History  . Not on file  Tobacco Use  . Smoking status: Never Smoker  . Smokeless tobacco: Never Used  Substance and Sexual Activity  . Alcohol use: No    Alcohol/week: 0.0 standard drinks  . Drug use: No  . Sexual activity: Yes    Comment: ist intercourse- 20, partners- 1  Other Topics Concern  . Not on file  Social History Narrative   Lives in [place]    Hobbies: sleep, soccer. Student at [employer] Strain:   . Difficulty of Paying Living Expenses:   Food Insecurity:   . Worried About Programme researcher, broadcasting/film/video in the Last Year:   . Barista in the Last Year:   Transportation Needs:   . Freight forwarder (Medical):   Marland Kitchen Lack of Transportation (Non-Medical):   Physical Activity:   . Days of Exercise per Week:   . Minutes of Exercise per Session:   Stress:   . Feeling of Stress :   Social Connections:   . Frequency of Communication with  Friends and Family:   . Frequency of Social Gatherings with Friends and Family:   . Attends Religious Services:   . Active Member of Clubs or Organizations:   . Attends Banker Meetings:   Marland Kitchen Marital Status:    Family History  Problem Relation Age of Onset  . Thyroid disease Mother   . Diabetes Father   . Arthritis Father   . Psoriasis Father   . Psoriasis Brother   . Diabetes Maternal Aunt   . Diabetes Maternal Uncle   . Diabetes Paternal Aunt   . Diabetes Paternal Uncle   . Diabetes Maternal Grandmother   . Diabetes Maternal Grandfather   . Hypertension Maternal Grandfather   . Diabetes Paternal Grandmother   . Diabetes Paternal Grandfather    No Known Allergies Prior to Admission medications   Medication Sig Start Date End Date Taking? Authorizing Provider  albuterol (PROVENTIL HFA;VENTOLIN HFA) 108 (90 Base) MCG/ACT inhaler Inhale 2 puffs into the lungs every 4 (four) hours as needed for wheezing or shortness of breath. 01/30/19   Mirian Mo, MD  azithromycin (ZITHROMAX) 250 MG tablet Take two pills on the first day and one pill for the next four days. 03/14/19   Mirian Mo, MD  benzonatate (TESSALON) 100 MG capsule Take 1-2 capsules (  100-200 mg total) by mouth 3 (three) times daily as needed for cough. 01/13/19   Elvina Sidle, MD  pantoprazole (PROTONIX) 20 MG tablet TAKE 1 TABLET BY MOUTH EVERY DAY 04/28/19   Mirian Mo, MD  sertraline (ZOLOFT) 100 MG tablet Take 1.5 tablets (150 mg total) by mouth daily. 01/05/20   Mirian Mo, MD     Positive ROS: Otherwise negative  All other systems have been reviewed and were otherwise negative with the exception of those mentioned in the HPI and as above.  Physical Exam: Constitutional: Alert, well-appearing, no acute distress Ears: External ears without lesions or tenderness. Ear canals are clear bilaterally with intact, clear TMs.  Nasal: External nose without lesions. Septum with mild deformity.  Mild  turbinate hypertrophy with mild rhinitis.. Clear nasal passages otherwise.  She has narrow nasal valve bilaterally that have a tendency to collapse on inhalation. Oral: Lips and gums without lesions. Tongue and palate mucosa without lesions. Posterior oropharynx clear.  She has moderate tonsils 2-3+ bilaterally. Neck: No palpable adenopathy or masses Respiratory: Breathing comfortably  Skin: No facial/neck lesions or rash noted.  Procedures  Assessment: Mild rhinitis. Nasal valve collapse bilaterally. Snoring.  Plan: Would recommend regular use of Flonase 2 sprays each nostril at night. Also described with her concerning use of nasal Breathe Right strips and/or air max intranasal valve stent. Would recommend trying this for a month initially and briefly discussed surgical options with her if she wants to pursue this.  She will contact us for follow-up in 4 weeks or more after trying the above measures. Also discussed possible tonsillectomy which would help with snoring if needed.  Narda Bonds, MD

## 2020-11-22 ENCOUNTER — Ambulatory Visit (HOSPITAL_COMMUNITY)
Admission: EM | Admit: 2020-11-22 | Discharge: 2020-11-22 | Disposition: A | Discharge status: Home / Self Care | Payer: Commercial Managed Care - PPO | Attending: Family Medicine | Admitting: Family Medicine

## 2020-11-22 ENCOUNTER — Encounter (HOSPITAL_COMMUNITY): Payer: Self-pay | Admitting: Emergency Medicine

## 2020-11-22 ENCOUNTER — Other Ambulatory Visit: Payer: Self-pay

## 2020-11-22 DIAGNOSIS — J020 Streptococcal pharyngitis: Secondary | ICD-10-CM

## 2020-11-22 DIAGNOSIS — J029 Acute pharyngitis, unspecified: Secondary | ICD-10-CM

## 2020-11-22 LAB — POCT RAPID STREP A, ED / UC: Streptococcus, Group A Screen (Direct): POSITIVE — AB

## 2020-11-22 MED ORDER — AMOXICILLIN 500 MG PO CAPS: 500.0000 mg | ORAL_CAPSULE | Freq: Two times a day (BID) | ORAL | 0 refills | Status: DC

## 2020-11-22 NOTE — ED Provider Notes (Signed)
[facility]   [phone] 11/22/20 Arrival Time: 0908  ASSESSMENT & PLAN:  1. Sore throat   2. Strep pharyngitis     No signs of peritonsillar abscess. Tolerating PO intake. Discussed.  Begin: Meds ordered this encounter  Medications  . amoxicillin (AMOXIL) 500 MG capsule    Sig: Take 1 capsule (500 mg total) by mouth 2 (two) times daily.    Dispense:  20 capsule    Refill:  0    Labs Reviewed  POCT RAPID STREP A, ED / UC - Abnormal; Notable for the following components:      Result Value   Streptococcus, Group A Screen (Direct) POSITIVE (*)    All other components within normal limits    OTC analgesics and throat care as needed  Instructed to finish full 10 day course of antibiotics. Will follow up if not showing significant improvement over the next 24-48 hours.    Discharge Instructions      You may use over the counter ibuprofen or acetaminophen as needed.   For a sore throat, over the counter products such as [employer] Peroxyl Mouth Sore Rinse or Chloraseptic Sore Throat Spray may provide some temporary relief. .      Reviewed expectations re: course of current medical issues. Questions answered. Outlined signs and symptoms indicating need for more acute intervention. Patient verbalized understanding. After Visit Summary given.   SUBJECTIVE:  Karen Burnett is a 24 y.o. female who reports a sore throat. Describes as intense sharp pain with swallowing. Onset abrupt beginning approx 48 hours ago. Symptoms have gradually worsened since beginning; without voice changes. No respiratory symptoms. Tolerating PO intake but reports discomfort with swallowing. No specific alleviating factors. Fever: believed to be present, temp not taken. No neck pain. No associated nausea, vomiting, or abdominal pain. Known sick contacts: none. Recent travel: none. OTC treatment: analgesics with some help.    OBJECTIVE:  Vitals:   11/22/20 1002  BP: 124/85  Pulse:  95  Temp: 99.6 F (37.6 C)  TempSrc: Oral  SpO2: 99%     General appearance: alert; no distress HEENT: throat with marked erythema and with exudative tonsillar hypertrophy; uvula is midline Neck: supple with FROM; bilateral cervical LAD that is TTP Lungs: speaks full sentences without difficulty; unlabored Skin: reveals no rash; warm and dry Psychological: alert and cooperative; normal mood and affect  No Known Allergies  Past Medical History:  Diagnosis Date  . Anxiety   . Ultrasound recheck of fetal pyelectasis, antepartum 05/24/2017  . Vaginal Pap smear, abnormal 10/2016   ASCUS, HPV neg so needs routine screen   Social History   Socioeconomic History  . Marital status: Single    Spouse name: Not on file  . Number of children: Not on file  . Years of education: Not on file  . Highest education level: Not on file  Occupational History  . Not on file  Tobacco Use  . Smoking status: Never Smoker  . Smokeless tobacco: Never Used  Substance and Sexual Activity  . Alcohol use: No    Alcohol/week: 0.0 standard drinks  . Drug use: No  . Sexual activity: Yes    Comment: ist intercourse- 20, partners- 1  Other Topics Concern  . Not on file  Social History Narrative   Lives in [place]    Hobbies: sleep, soccer. Student at [employer] Strain:   . Difficulty of Paying Living Expenses: Not on  file  Food Insecurity:   . Worried About Programme researcher, broadcasting/film/video in the Last Year: Not on file  . Ran Out of Food in the Last Year: Not on file  Transportation Needs:   . Lack of Transportation (Medical): Not on file  . Lack of Transportation (Non-Medical): Not on file  Physical Activity:   . Days of Exercise per Week: Not on file  . Minutes of Exercise per Session: Not on file  Stress:   . Feeling of Stress : Not on file  Social Connections:   . Frequency of Communication with Friends and Family: Not on file  . Frequency of Social  Gatherings with Friends and Family: Not on file  . Attends Religious Services: Not on file  . Active Member of Clubs or Organizations: Not on file  . Attends Banker Meetings: Not on file  . Marital Status: Not on file  Intimate Partner Violence:   . Fear of Current or Ex-Partner: Not on file  . Emotionally Abused: Not on file  . Physically Abused: Not on file  . Sexually Abused: Not on file   Family History  Problem Relation Age of Onset  . Thyroid disease Mother   . Diabetes Father   . Arthritis Father   . Psoriasis Father   . Psoriasis Brother   . Diabetes Maternal Aunt   . Diabetes Maternal Uncle   . Diabetes Paternal Aunt   . Diabetes Paternal Uncle   . Diabetes Maternal Grandmother   . Diabetes Maternal Grandfather   . Hypertension Maternal Grandfather   . Diabetes Paternal Grandmother   . Diabetes Paternal Glynda Jaeger, MD 11/22/20 1027

## 2020-11-22 NOTE — ED Triage Notes (Addendum)
Pt c/o sore throat and headache since Saturday. Pt states she cannot talk normally due to sore throat. Pt states she was just covid tested this morning thru health at work.

## 2020-11-22 NOTE — Discharge Instructions (Signed)
You may use over the counter ibuprofen or acetaminophen as needed.  °For a sore throat, over the counter products such as [employer] Peroxyl Mouth Sore Rinse or Chloraseptic Sore Throat Spray may provide some temporary relief. ° ° ° ° °

## 2020-12-13 ENCOUNTER — Other Ambulatory Visit: Payer: Self-pay

## 2020-12-13 ENCOUNTER — Ambulatory Visit (HOSPITAL_COMMUNITY)
Admission: EM | Admit: 2020-12-13 | Discharge: 2020-12-13 | Disposition: A | Discharge status: Home / Self Care | Payer: Medicaid Other | Attending: Family Medicine | Admitting: Family Medicine

## 2020-12-13 ENCOUNTER — Encounter (HOSPITAL_COMMUNITY): Payer: Self-pay

## 2020-12-13 DIAGNOSIS — J020 Streptococcal pharyngitis: Secondary | ICD-10-CM

## 2020-12-13 LAB — POCT RAPID STREP A, ED / UC: Streptococcus, Group A Screen (Direct): POSITIVE — AB

## 2020-12-13 MED ORDER — LIDOCAINE VISCOUS HCL 2 % MT SOLN: 10.0000 mL | OROMUCOSAL | 0 refills | Status: DC | PRN

## 2020-12-13 MED ORDER — AMOXICILLIN-POT CLAVULANATE 875-125 MG PO TABS: 1.0000 | ORAL_TABLET | Freq: Two times a day (BID) | ORAL | 0 refills | Status: DC

## 2020-12-13 NOTE — ED Provider Notes (Signed)
[facility]    CSN: [id number] Arrival date & time: 12/13/20  1015      History   Chief Complaint Chief Complaint  Patient presents with  . Sore Throat  . Cough  . Fatigue    HPI Karen Burnett is a 24 y.o. female.   Here today with 1 day history of sore, swollen throat, pain swallowing, low grade fever, fatigue. Denies congestion, cough, abdominal pain, N/V, CP, SOB. Has been drinking hot fluids with minimal relief. States she had strep throat 3 weeks ago and it felt the same as this. Multiple sick family members recently as well.      Past Medical History:  Diagnosis Date  . Anxiety   . Ultrasound recheck of fetal pyelectasis, antepartum 05/24/2017  . Vaginal Pap smear, abnormal 10/2016   ASCUS, HPV neg so needs routine screen    Patient Active Problem List   Diagnosis Date Noted  . Overweight 12/19/2018  . Generalized anxiety disorder 01/02/2017  . Family history of diabetes mellitus 10/01/2015  . Nexplanon insertion 06/24/2015    Past Surgical History:  Procedure Laterality Date  . NO PAST SURGERIES      OB History    Gravida  1   Para  1   Term  1   Preterm  0   AB  0   Living  1     SAB  0   IAB  0   Ectopic  0   Multiple  0   Live Births  1            Home Medications    Prior to Admission medications   Medication Sig Start Date End Date Taking? Authorizing Provider  amoxicillin-clavulanate (AUGMENTIN) 875-125 MG tablet Take 1 tablet by mouth every 12 (twelve) hours. 12/13/20  Yes Particia Nearing, PA-C  lidocaine (XYLOCAINE) 2 % solution Use as directed 10 mLs in the mouth or throat as needed for mouth pain. 12/13/20  Yes Particia Nearing, PA-C  albuterol (PROVENTIL HFA;VENTOLIN HFA) 108 (90 Base) MCG/ACT inhaler Inhale 2 puffs into the lungs every 4 (four) hours as needed for wheezing or shortness of breath. 01/30/19   Mirian Mo, MD  pantoprazole (PROTONIX) 20 MG tablet TAKE 1 TABLET BY MOUTH EVERY  DAY 04/28/19 11/22/20  Mirian Mo, MD  sertraline (ZOLOFT) 100 MG tablet Take 1.5 tablets (150 mg total) by mouth daily. 01/05/20 11/22/20  Mirian Mo, MD    Family History Family History  Problem Relation Age of Onset  . Thyroid disease Mother   . Diabetes Father   . Arthritis Father   . Psoriasis Father   . Psoriasis Brother   . Diabetes Maternal Aunt   . Diabetes Maternal Uncle   . Diabetes Paternal Aunt   . Diabetes Paternal Uncle   . Diabetes Maternal Grandmother   . Diabetes Maternal Grandfather   . Hypertension Maternal Grandfather   . Diabetes Paternal Grandmother   . Diabetes Paternal Grandfather     Social History Social History   Tobacco Use  . Smoking status: Never Smoker  . Smokeless tobacco: Never Used  Substance Use Topics  . Alcohol use: No    Alcohol/week: 0.0 standard drinks  . Drug use: No     Allergies   Patient has no known allergies.   Review of Systems Review of Systems PER HPI    Physical Exam Triage Vital Signs ED Triage Vitals  Enc Vitals Group     BP 12/13/20  1244 113/73     Pulse Rate 12/13/20 1244 (!) 102     Resp 12/13/20 1244 20     Temp 12/13/20 1244 99.9 F (37.7 C)     Temp Source 12/13/20 1244 Oral     SpO2 12/13/20 1244 98 %     Weight --      Height --      Head Circumference --      Peak Flow --      Pain Score 12/13/20 1243 8     Pain Loc --      Pain Edu? --      Excl. in GC? --    No data found.  Updated Vital Signs BP 113/73 (BP Location: Left Arm)   Pulse (!) 102   Temp 99.9 F (37.7 C) (Oral)   Resp 20   LMP 11/29/2020 (Exact Date)   SpO2 98%   Visual Acuity Right Eye Distance:   Left Eye Distance:   Bilateral Distance:    Right Eye Near:   Left Eye Near:    Bilateral Near:     Physical Exam Vitals and nursing note reviewed.  Constitutional:      Appearance: Normal appearance. She is not ill-appearing.  HENT:     Head: Atraumatic.     Right Ear: Tympanic membrane normal.     Left  Ear: Tympanic membrane normal.     Nose: Nose normal.     Mouth/Throat:     Mouth: Mucous membranes are moist.     Pharynx: Oropharyngeal exudate (trace) and posterior oropharyngeal erythema (and b/l tonsillar edema) present.  Eyes:     Extraocular Movements: Extraocular movements intact.     Conjunctiva/sclera: Conjunctivae normal.  Cardiovascular:     Rate and Rhythm: Normal rate and regular rhythm.     Heart sounds: Normal heart sounds.  Pulmonary:     Effort: Pulmonary effort is normal.     Breath sounds: Normal breath sounds.  Abdominal:     General: Bowel sounds are normal. There is no distension.     Palpations: Abdomen is soft.     Tenderness: There is no abdominal tenderness. There is no guarding.  Musculoskeletal:        General: Normal range of motion.     Cervical back: Normal range of motion and neck supple.  Skin:    General: Skin is warm and dry.  Neurological:     Mental Status: She is alert and oriented to person, place, and time.  Psychiatric:        Mood and Affect: Mood normal.        Thought Content: Thought content normal.        Judgment: Judgment normal.      UC Treatments / Results  Labs (all labs ordered are listed, but only abnormal results are displayed) Labs Reviewed  POCT RAPID STREP A, ED / UC - Abnormal; Notable for the following components:      Result Value   Streptococcus, Group A Screen (Direct) POSITIVE (*)    All other components within normal limits    EKG   Radiology No results found.  Procedures Procedures (including critical care time)  Medications Ordered in UC Medications - No data to display  Initial Impression / Assessment and Plan / UC Course  I have reviewed the triage vital signs and the nursing notes.  Pertinent labs & imaging results that were available during my care of the patient were reviewed by me  and considered in my medical decision making (see chart for details).     Rapid strep positive, will  re-treat with augmentin, viscous lidocaine, OTC pain relievers. She notes she did not change her toothbrush after first round of treatment, will do this and thorough home cleaning. Return if not resolving.   Final Clinical Impressions(s) / UC Diagnoses   Final diagnoses:  Strep pharyngitis   Discharge Instructions   None    ED Prescriptions    Medication Sig Dispense Auth. Provider   amoxicillin-clavulanate (AUGMENTIN) 875-125 MG tablet Take 1 tablet by mouth every 12 (twelve) hours. 14 tablet Particia Nearing, [place]   lidocaine (XYLOCAINE) 2 % solution Use as directed 10 mLs in the mouth or throat as needed for mouth pain. 100 mL Particia Nearing, [place]     PDMP not reviewed this encounter.   Particia Nearing, [place] 12/13/20 1338

## 2020-12-13 NOTE — ED Triage Notes (Signed)
Pt in with c/o ST, swollen lymph node, cough and feeling lethargic that started yesterday. Also c/o trouble swallowing and talking  Pt has tried ibuprofen with no relief

## 2021-01-28 DIAGNOSIS — F331 Major depressive disorder, recurrent, moderate: Secondary | ICD-10-CM | POA: Diagnosis not present

## 2021-01-28 DIAGNOSIS — F411 Generalized anxiety disorder: Secondary | ICD-10-CM | POA: Diagnosis not present

## 2021-03-01 ENCOUNTER — Other Ambulatory Visit (HOSPITAL_COMMUNITY): Payer: Self-pay | Admitting: Pharmacist

## 2021-03-03 ENCOUNTER — Other Ambulatory Visit: Payer: Self-pay

## 2021-03-03 ENCOUNTER — Ambulatory Visit (INDEPENDENT_AMBULATORY_CARE_PROVIDER_SITE_OTHER): Payer: 59 | Admitting: Family Medicine

## 2021-03-03 VITALS — BP 116/78 | HR 66 | Ht 62.0 in | Wt 186.2 lb

## 2021-03-03 DIAGNOSIS — Z3046 Encounter for surveillance of implantable subdermal contraceptive: Secondary | ICD-10-CM | POA: Diagnosis not present

## 2021-03-03 DIAGNOSIS — Z3202 Encounter for pregnancy test, result negative: Secondary | ICD-10-CM | POA: Diagnosis not present

## 2021-03-03 LAB — POCT URINE PREGNANCY: Preg Test, Ur: NEGATIVE

## 2021-03-03 NOTE — Progress Notes (Signed)
Lot #: [id number] Ext: 03/17/23

## 2021-03-03 NOTE — Progress Notes (Signed)
Progestin Implant Removal Note Karen Burnett is here for removal of her etonogestrel rod implant (Implanon/Nexplanon). She would like it removed because of: Expiration  An informed consent was taken prior to removal and is to be scanned into the Electronic Health Record.  Risks of the procedure include: bleeding, infection, difficulty with removal, scarring and nerve damage. There may be bruising at the site of incision and down the arm.  Procedure Note: Time out taken: Yes  Team: Dr. Jovita Kussmaul, Dr. Janit Pagan, Nurse Glori Bickers  PATIENT NAME & PATIENT DOB confirmed YES  Procedure: Progestin Implant Removal  Procedure confirmed by patient and team YES  Side:LEFT  Position correct for procedure YES  Equipment for procedure available YES  The patient is place in the supine position. Aseptic conditions are maintained. The rod is located by palpation. The area is cleaned with antiseptic. 5 cc of 1% lidocaine with epinephrine is injected just underneath the end of the implant closest to the elbow. After firmly pressing down on the end of the implant closer to the axilla a 2-3 mm incision is made with a scalpel. The rod is pushed to the incision site and grasped with a mosquito forceps and gently removed. Blunt dissection WAS NOT needed. The patient DID tolerate the procedure well. The rod was removed in its entirety. The incision was dressed with a small adhesive bandage closure and a pressure dressing was applied. An alternate plan for contraception was discussed. The patient would like to use Nexplanon for her contraception.    PRE-OP DIAGNOSIS: desired long-term, reversible contraception  POST-OP DIAGNOSIS: Same  PROCEDURE: Nexplanon  placement Performing Physician: Jovita Kussmaul Supervising Physician (if applicable): Janit Pagan   PROCEDURE:  ICON :  _  Negative Site (check):         Left Arm        Serial # [id number] Sterile Preparation: Betadine             Expiration Date- 03/17/23   Insertion site was selected 8 - 10 cm from medial epicondyle, and 3 cm below bicipital groove and marked along with guiding site using sterile marker Procedure area was prepped and draped in a sterile fashion. 5 mL of 1% lidocaine without epinephrine used for subcutaneous anesthesia. Anesthesia confirmed.  Nexplanon  trocar was inserted subcutaneously and then Nexplanon  capsule delivered subcutaneously Trocar was removed from the insertion site. Nexplanon  capsule was palpated by provider and patient to assure satisfactory placement. Estimated blood loss of 1  mL Dressings applied:    X   Adhesive Dressing     X  Gauze/Tape      Followup: The patient tolerated the procedure well without complications.  Standard post-procedure care is explained and return precautions are given.   Jovita Kussmaul, MD 03/03/2021, 10:58 AM PGY-1, [facility]

## 2021-03-03 NOTE — Patient Instructions (Addendum)
Nexplanon Instructions After Insertion   Keep bandage clean and dry for 24 hours   May use ice/Tylenol/Ibuprofen for soreness or pain   If you develop fever, drainage or increased warmth from incision site-contact office immediately   Please reach out if you have any increased anxiety in the next few weeks.

## 2021-03-04 DIAGNOSIS — F411 Generalized anxiety disorder: Secondary | ICD-10-CM | POA: Diagnosis not present

## 2021-03-04 DIAGNOSIS — F331 Major depressive disorder, recurrent, moderate: Secondary | ICD-10-CM | POA: Diagnosis not present

## 2021-03-11 ENCOUNTER — Ambulatory Visit (INDEPENDENT_AMBULATORY_CARE_PROVIDER_SITE_OTHER): Payer: 59 | Admitting: Family Medicine

## 2021-03-11 ENCOUNTER — Other Ambulatory Visit: Payer: Self-pay

## 2021-03-11 ENCOUNTER — Encounter: Payer: Self-pay | Admitting: Family Medicine

## 2021-03-11 VITALS — BP 110/68 | HR 87 | Ht 62.0 in | Wt 186.1 lb

## 2021-03-11 DIAGNOSIS — E663 Overweight: Secondary | ICD-10-CM

## 2021-03-11 DIAGNOSIS — F411 Generalized anxiety disorder: Secondary | ICD-10-CM

## 2021-03-11 DIAGNOSIS — M549 Dorsalgia, unspecified: Secondary | ICD-10-CM | POA: Insufficient documentation

## 2021-03-11 NOTE — Progress Notes (Signed)
SUBJECTIVE:   CHIEF COMPLAINT / HPI:   Anxiety/mood She reports that she is currently seeing a therapist and has not taking any medication.  She reports that her anxiety has dramatically improved as she has built a good relationship with her therapist and switch jobs to a less stressful position.  Overall, she denies any significant anxiety symptoms and is very happy with her current state.  Sexually active She is currently sexually active with one female partner.  She is not interested in STI screening today.  Screening for cervical cancer Pap smear declined today.   BMI of 34 She is aware that she has gained about 10 pounds in the past year or 2.  It has been very difficult for her to lose weight since the birth of her son about 3 years ago.  She would like to get down to a body weight of 150 pounds (current weight 186 pounds).  Upper back pain She is noticed that she has been suffering from upper back pain roughly since the birth of her son about 3 years ago.  Along the same timeline, she has noticed that her breasts have grown quite a bit.  She describes her pain as an aching or stretching across her shoulders and upper back.  There is no significant radiation of shooting pain.  She notices this pain most after activities, especially running.  The pain seems to be significantly improved when she is able to lie down flat on the floor.  Low back pain She has been experiencing low back pain for 10-12 years now.  She is not experiencing any low back pain today.  When this pain does bother her, she notices it most as a mild, achy sensation over the dimples of her lower back.  PERTINENT  PMH / PSH: Anxiety  OBJECTIVE:   BP 110/68   Pulse 87   Ht 5\' 2"  (1.575 m)   Wt 186 lb 2 oz (84.4 kg)   LMP 12/18/2020   SpO2 99%   BMI 34.04 kg/m    General: Alert and cooperative and appears to be in no acute distress Cardio: Normal S1 and S2, no S3 or S4. Rhythm is regular. No murmurs or  rubs.   Pulm: Clear to auscultation bilaterally, no crackles, wheezing, or diminished breath sounds. Normal respiratory effort Back: No tenderness with percussion of the thoracic or lumbar spine.  No tenderness with percussion of the SI joint.  No paraspinal muscle tenderness.  Straight leg raise negative bilaterally. Extremities: No peripheral edema. Warm/ well perfused.  Strong radial pulses. Neuro: Cranial nerves grossly intact  ASSESSMENT/PLAN:   Overweight We discussed weight loss with diet and exercise and planning for no more than 1 pound per week.  We discussed that 150 pounds may be a good long-term goal but it may take a little while to get there.  She was interested in a physician's referral exercise program in addition to further nutrition counseling for weight loss. -Referral to the physician referral exercise program placed -Contact information for Dr. 02/15/2021 provided for further nutrition guidance for weight loss -Follow-up in 2 months  Generalized anxiety disorder Well-controlled today.  Not currently on any medication.  Happy with her current therapist. -No changes today.  Continue with therapy  Upper back pain Due to her lack of trauma and the slow, insidious onset of this discomfort and the fact that it coincides with breast enlargement, I think it is consistent with back pain related to her breasts.  She was encouraged to wear a well fitted sports bra for better support generally and to consider wearing 2 sports bras during activity.  In general, weight loss may be helpful for this issue.     Mirian Mo, MD [facility]

## 2021-03-11 NOTE — Assessment & Plan Note (Signed)
We discussed weight loss with diet and exercise and planning for no more than 1 pound per week.  We discussed that 150 pounds may be a good long-term goal but it may take a little while to get there.  She was interested in a physician's referral exercise program in addition to further nutrition counseling for weight loss. -Referral to the physician referral exercise program placed -Contact information for Dr. Gerilyn Pilgrim provided for further nutrition guidance for weight loss -Follow-up in 2 months

## 2021-03-11 NOTE — Patient Instructions (Addendum)
It was great to see you today.  Here is a quick review of the things we talked about:  Overweight: I have put in referral to the exercise program we talked about.  It is called the provider referral exercise program.  You should get a call in the next 1-2 weeks about this program.  Nutrition: I recommend you call our nutritionist, Dr. Gerilyn Pilgrim to set up an appointment for additional conversations about the best nutrition for weight loss.  Pap smear: This is a screening for cervical cancer.  This is an important part of your health maintenance.  I recommend that you return in the next 1-2 months to have a Pap smear done.  I would love to see you again in clinic in the next 1-2 months so we could see how your weight loss is going and talk more about it.

## 2021-03-11 NOTE — Assessment & Plan Note (Signed)
Well-controlled today.  Not currently on any medication.  Happy with her current therapist. -No changes today.  Continue with therapy

## 2021-03-11 NOTE — Assessment & Plan Note (Signed)
Due to her lack of trauma and the slow, insidious onset of this discomfort and the fact that it coincides with breast enlargement, I think it is consistent with back pain related to her breasts.  She was encouraged to wear a well fitted sports bra for better support generally and to consider wearing 2 sports bras during activity.  In general, weight loss may be helpful for this issue.

## 2021-03-14 MED ORDER — ETONOGESTREL 68 MG ~~LOC~~ IMPL
68.0000 mg | DRUG_IMPLANT | Freq: Once | SUBCUTANEOUS | Status: AC
  Administered 2021-03-03: 68 mg via SUBCUTANEOUS

## 2021-03-14 NOTE — Addendum Note (Signed)
Addended by: Veronda Prude on: 03/14/2021 10:55 AM   Modules accepted: Orders

## 2021-04-21 DIAGNOSIS — F411 Generalized anxiety disorder: Secondary | ICD-10-CM | POA: Diagnosis not present

## 2021-04-21 DIAGNOSIS — F331 Major depressive disorder, recurrent, moderate: Secondary | ICD-10-CM | POA: Diagnosis not present

## 2021-05-12 DIAGNOSIS — F331 Major depressive disorder, recurrent, moderate: Secondary | ICD-10-CM | POA: Diagnosis not present

## 2021-05-12 DIAGNOSIS — F411 Generalized anxiety disorder: Secondary | ICD-10-CM | POA: Diagnosis not present

## 2021-08-08 ENCOUNTER — Other Ambulatory Visit (HOSPITAL_COMMUNITY): Payer: Self-pay

## 2021-08-08 MED ORDER — QUICKVUE AT-HOME COVID-19 TEST VI KIT
PACK | 0 refills | Status: DC
  Filled 2021-08-08: qty 2, 2d supply, fill #0

## 2022-01-09 ENCOUNTER — Other Ambulatory Visit: Payer: Self-pay

## 2022-01-09 ENCOUNTER — Encounter: Payer: Self-pay | Admitting: Family Medicine

## 2022-01-09 ENCOUNTER — Ambulatory Visit (INDEPENDENT_AMBULATORY_CARE_PROVIDER_SITE_OTHER): Payer: 59 | Admitting: Family Medicine

## 2022-01-09 VITALS — BP 116/82 | HR 72 | Wt 190.0 lb

## 2022-01-09 DIAGNOSIS — M533 Sacrococcygeal disorders, not elsewhere classified: Secondary | ICD-10-CM | POA: Insufficient documentation

## 2022-01-09 NOTE — Assessment & Plan Note (Signed)
Patient with SI joint dysfunction, L>R. Recommend PT, referral sent. Continue conservative measures. If no improvement in 4-6 weeks, recommend referral to sports med and consider radiography.

## 2022-01-09 NOTE — Progress Notes (Signed)
° ° °  SUBJECTIVE:   CHIEF COMPLAINT / HPI:   Back pain: patient reports long history of intermittent back pain, but had significant flare 2 weeks ago that is still bothering her. She was at work and bent down to pick up a small item on the ground and had intense L>R low back pain. She denies saddle anesthesia and incontinence. Pain is mostly on left side, but some right sided. She feels that her back and pelvis aren't quite aligned.   PERTINENT  PMH / PSH: non-contributory  OBJECTIVE:   BP 116/82    Pulse 72    Wt 190 lb (86.2 kg)    BMI 34.75 kg/m   Nursing note and vitals reviewed GEN: young woman, resting comfortably in chair, NAD, class I obesity Hips: No obvious rash, erythema, ecchymosis, or edema. ROM full in all directions; Strength 5/5 in IR/ER/Flex/Ext/Abd/Add. Pelvic alignment remarkable to inspection and palpation (L higher than R). Pelvic tilt present L>R. Greater trochanter without tenderness to palpation. No tenderness over piriformis. + SI joint tenderness and normal minimal SI movement. Lumbar spine: - Inspection: no gross deformity or asymmetry, swelling or ecchymosis. No skin changes - Palpation: No TTP over the spinous processes, paraspinal muscles; + SI joints b/l L>R - ROM: full active ROM of the lumbar spine in flexion and extension without pain - Strength: 5/5 strength of lower extremity in L4-S1 nerve root distributions b/l - Neuro: sensation intact in the L4-S1 nerve root distribution b/l, 2+ L4 and S1 reflexes - Straight Leg Raise test: NEG Neuro: AOx3  Ext: no edema Psych: Pleasant and appropriate   ASSESSMENT/PLAN:   SI (sacroiliac) joint dysfunction Patient with SI joint dysfunction, L>R. Recommend PT, referral sent. Continue conservative measures. If no improvement in 4-6 weeks, recommend referral to sports med and consider radiography.     Shirlean Mylar, MD [facility]

## 2022-01-09 NOTE — Patient Instructions (Signed)
It was a pleasure to see you today!  I have placed a referral for physical therapy. You should receive a phone call in 1-2 weeks to schedule this appointment. If for any reason you do not receive a phone call or need more help scheduling this appointment, please call our office at [phone]. Continue doing heat or ice (whichever is more comfortable) you can use tylenol or ibuprofen, continue to walk. If no improvement or not as much improvement as hoped for in 4-6 weeks, you may need referral to sports medicine. Give our office a call    Be Well,  Dr. Chauncey Reading

## 2022-01-19 ENCOUNTER — Ambulatory Visit: Payer: 59 | Attending: Family Medicine | Admitting: Physical Therapy

## 2022-01-19 ENCOUNTER — Other Ambulatory Visit: Payer: Self-pay

## 2022-01-19 DIAGNOSIS — R262 Difficulty in walking, not elsewhere classified: Secondary | ICD-10-CM | POA: Insufficient documentation

## 2022-01-19 DIAGNOSIS — M6281 Muscle weakness (generalized): Secondary | ICD-10-CM | POA: Diagnosis not present

## 2022-01-19 DIAGNOSIS — M533 Sacrococcygeal disorders, not elsewhere classified: Secondary | ICD-10-CM | POA: Insufficient documentation

## 2022-01-19 DIAGNOSIS — M5440 Lumbago with sciatica, unspecified side: Secondary | ICD-10-CM | POA: Diagnosis not present

## 2022-01-19 NOTE — Patient Instructions (Signed)
Trigger Point Dry Needling  What is Trigger Point Dry Needling (DN)? DN is a physical therapy technique used to treat muscle pain and dysfunction. Specifically, DN helps deactivate muscle trigger points (muscle knots).  A thin filiform needle is used to penetrate the skin and stimulate the underlying trigger point. The goal is for a local twitch response (LTR) to occur and for the trigger point to relax. No medication of any kind is injected during the procedure.   What Does Trigger Point Dry Needling Feel Like?  The procedure feels different for each individual patient. Some patients report that they do not actually feel the needle enter the skin and overall the process is not painful. Very mild bleeding may occur. However, many patients feel a deep cramping in the muscle in which the needle was inserted. This is the local twitch response.   How Will I feel after the treatment? Soreness is normal, and the onset of soreness may not occur for a few hours. Typically this soreness does not last longer than two days.  Bruising is uncommon, however; ice can be used to decrease any possible bruising.  In rare cases feeling tired or nauseous after the treatment is normal. In addition, your symptoms may get worse before they get better, this period will typically not last longer than 24 hours.   What Can I do After My Treatment? Increase your hydration by drinking more water for the next 24 hours. You may place ice or heat on the areas treated that have become sore, however, do not use heat on inflamed or bruised areas. Heat often brings more relief post needling. You can continue your regular activities, but vigorous activity is not recommended initially after the treatment for 24 hours. DN is best combined with other physical therapy such as strengthening, stretching, and other therapies.     PELVIC TILT  Lie on back, legs bent. Exhale, tilting top of pelvis back, pubic bone up, to flatten lower  back. Inhale, rolling pelvis opposite way, top forward, pubic bone down, arch in back. Repeat __10__ times. Do __2__ sessions per day. Copyright  [employer]. All rights reserved.    Isometric Hold With Pelvic Floor (Hook-Lying)  Lie with hips and knees bent. Slowly inhale, and then exhale. Pull navel toward spine and tighten pelvic floor. Hold for __10_ seconds. Continue to breathe in and out during hold. Rest for _10__ seconds. Repeat __10_ times. Do __2-3_ times a day.   Knee Fold  Lie on back, legs bent, arms by sides. Exhale, lifting knee to chest. Inhale, returning. Keep abdominals flat, navel to spine. Repeat __10__ times, alternating legs. Do __2__ sessions per day.  Knee Drop  Keep pelvis stable. Without rotating hips, slowly drop knee to side, pause, return to center, bring knee across midline toward opposite hip. Feel obliques engaging. Repeat for ___10_ times each leg.    Karen Burnett, PT, [employer] Certified Exercise Expert for the Aging Adult  01/19/22 12:47 PM Phone: [phone] Fax: [phone]

## 2022-01-19 NOTE — Therapy (Signed)
OUTPATIENT PHYSICAL THERAPY THORACOLUMBAR EVALUATION   Patient Name: Karen Burnett MRN: [id number] DOB:1996-11-20, 26 y.o., female Today's Date: 01/19/2022   PT End of Session - 01/19/22 1213     Visit Number 1    Number of Visits 12    Date for PT Re-Evaluation 03/02/22    Authorization Type UMR    PT Start Time 1210    PT Stop Time 1305    PT Time Calculation (min) 55 min    Activity Tolerance Patient tolerated treatment well    Behavior During Therapy [facility] for tasks assessed/performed             Past Medical History:  Diagnosis Date   Anxiety    Ultrasound recheck of fetal pyelectasis, antepartum 05/24/2017   Vaginal Pap smear, abnormal 10/2016   ASCUS, HPV neg so needs routine screen   Past Surgical History:  Procedure Laterality Date   NO PAST SURGERIES     Patient Active Problem List   Diagnosis Date Noted   SI (sacroiliac) joint dysfunction 01/09/2022   Upper back pain 03/11/2021   Overweight 12/19/2018   Generalized anxiety disorder 01/02/2017   Family history of diabetes mellitus 10/01/2015   Nexplanon insertion 06/24/2015    PCP: Rise Patience, DO  REFERRING PROVIDER: Zenia Resides, MD  REFERRING DIAG: M53.3 (ICD-10-CM) - SI (sacroiliac) joint dysfunction  THERAPY DIAG:  Acute right-sided low back pain with sciatica, sciatica laterality unspecified - Plan: PT plan of care cert/re-cert  Muscle weakness (generalized) - Plan: PT plan of care cert/re-cert  Difficulty in walking, not elsewhere classified - Plan: PT plan of care cert/re-cert  ONSET DATE:approximately 12-29-21  SUBJECTIVE:                                                                                                                                                                                           SUBJECTIVE STATEMENT: After work, I started having progressive pain starting on my left low back while I was out at dinner, but by the time I drove home I was taking tiny steps  and a hard time getting out of the car around 12-29-21.  By the next morning I returned to work and I had difficulty getting in and out of the car.  I worked all day but I have more and more difficulty bending over and being able to complete my job.  I continue to have pain and limitations. PERTINENT HISTORY:  SI problems since high school, Nothing remarkable  PAIN:  Are you having pain? Yes NPRS scale: 2/10 at rest  at worst 9/10 Pain location: Left SI R>L pubic pain on  palpation Pain orientation: Other: R>L pubic pain,  L SI    PAIN TYPE: acute on chronic Pain description: intermittent, sharp, stabbing, and shooting   Aggravating factors: bending over, picking up kids and groceries, sweeping and mopping going up and  down stairs Relieving factors: not moving, lying down  PRECAUTIONS: None  WEIGHT BEARING RESTRICTIONS No  FALLS:  Has patient fallen in last 6 months? 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  PLOF: Independent going to the gym before SI problems  PATIENT GOALS Return to the gym and be able to pick up my children and carry groceries, Be able to work a full day without pain   OBJECTIVE:   DIAGNOSTIC FINDINGS:  none  PATIENT SURVEYS:  FOTO 52% prediced 74%  SCREENING FOR RED FLAGS: Bowel or bladder incontinence: No Spinal tumors: No Cauda equina syndrome: No Compression fracture: No Abdominal aneurysm: No  COGNITION:  Overall cognitive status: Within functional limits for tasks assessed     SENSATION:  Light touch: Appears intact  Stereognosis: Appears intact  Hot/Cold: Appears intact  Proprioception: Appears intact  MUSCLE LENGTH: Hamstrings: Right 75 deg; Left 60 deg pain in Left SI Thomas test: WNL  POSTURE:   Left SI more  posterior than R anteriorly rotated. Slightly elevated R pelvic level. Left Sacral rotaion R pubic bone more superior and anterior  PALPATION: TTP on on  L SI and Left QL. Left SI [place] test L doesn't move , pain ful pubic symphysis  LUMBARAROM/PROM  A/PROM A/PROM  01/19/2022  Flexion Can touch ankle bil Pain on returning to upright  Extension WNL  Right lateral flexion 2 in finger tip below knee jt line P with returning to upright posture  Left lateral flexion 2.5 finger tip to below knee jt line  Right rotation WNL  Left rotation WNL   (Blank rows = not tested)  LE AROM/PROM:    AROM WNL throughout LE A/PROM Right 01/19/2022 Left 01/19/2022  Hip flexion 123 117 pain on L SI  Hip extension    Hip abduction    Hip adduction    Hip internal rotation    Hip external rotation    Knee flexion    Knee extension    Ankle dorsiflexion    Ankle plantarflexion    Ankle inversion    Ankle eversion     (Blank rows = not tested)  LE MMT:   Left pain in SI with any movement MMT Right 01/19/2022 Left 01/19/2022  Hip flexion 5/5 4/5 pain in Left SI  Hip extension 4+/5 4+/5  Hip abduction 4+/5 4/5  Hip adduction    Hip internal rotation    Hip external rotation    Knee flexion 5/5 5/5  Knee extension 5/5 5/5  Ankle dorsiflexion 5/5 5/5  Ankle plantarflexion 5/5 5/5  Ankle inversion    Ankle eversion     (Blank rows = not tested)  LUMBAR SPECIAL TESTS:  Straight leg raise test: Positive, Slump test: Negative, SI Compression/distraction test: Positive, and FABER test: Positive  FUNCTIONAL TESTS:  Squat pt unable to squat fully wt bears to R  GAIT: Distance walked: 150 Assistive device utilized: None Level of assistance: Complete Independence Comments: Pt walks slowly but normally to not exacerbate L SI pain    TODAY'S TREATMENT  MET to correct  sup R pubic rami,  even with L at end of session and decrease pain Lumbosacral thrust  left sidelying  with cavitation STW to  Left lumbosacral/Left QL  Trigger Point Dry Needling Treatment: Pre-treatment instruction: Patient instructed on dry needling rationale, procedures, and possible side effects including pain during treatment (achy,cramping feeling), bruising, drop of blood, lightheadedness, nausea, sweating. Patient Consent Given: Yes Education handout provided: Previously provided Muscles treated: Lumbar L 5/piriformis L  L QL Needle size and number: .30x 75 mm x 2 Electrical stimulation performed: No Parameters: N/A Treatment response/outcome: Twitch response elicited, Palpable decrease in muscle tension, and increased motion with less pain Post-treatment instructions: Patient instructed to expect possible mild to moderate muscle soreness later today and/or tomorrow. Patient instructed in methods to reduce muscle soreness and to continue prescribed HEP. If patient was dry needled over the lung field, patient was instructed on signs and symptoms of pneumothorax and, however unlikely, to see immediate medical attention should they occur. Patient was also educated on signs and symptoms of infection and to seek medical attention should they occur. Patient verbalized understanding of these instructions and education.  PATIENT EDUCATION:  Education details: POC  went over FOTO report, given HEP (Building control surveyor) Person educated: Patient Education method: Explanation, Demonstration, Tactile cues, Verbal cues, and Handouts Education comprehension: verbalized understanding, returned demonstration, verbal cues required, tactile cues required, and needs further education   HOME EXERCISE PROGRAM: Pre pilates   Pelvic Tilt, abdominal bracing, Knee fold and Knee drop  ASSESSMENT:  CLINICAL IMPRESSION: Patient is a 26 y.o. female who was seen today for physical therapy evaluation and treatment for 3 week acute pain for SI joint L (pt reports having SI problems since a teenager)  Pt would like to return to the gym where  she had been working out before waking up to B[place] pain. Pt does not report an incident but can not perform household chores or care for family, or perform basic transfers without pain. Objective impairments include decreased activity tolerance, decreased mobility, difficulty walking, decreased strength, increased fascial restrictions, improper body mechanics, postural dysfunction, and pain. These impairments are limiting patient from cleaning, driving, occupation, laundry, shopping, and cannot exercise at gym . Personal factors including  Patient will benefit from skilled PT to address above impairments and improve overall function.  REHAB POTENTIAL: Excellent  CLINICAL DECISION MAKING: Stable/uncomplicated  EVALUATION COMPLEXITY: Low   GOALS: Goals reviewed with patient? Yes  SHORT TERM GOALS:  STG Name Target Date Goal status  1 Pt will be independent with initial HEP Baseline: need HEP 02/09/2022 INITIAL  2 Sit and stand with RT=LT wt bearing to reduce lumbar/sacral strain and allow for increased tolerance for these positions for work tasks Baseline: Wt bears to Right when standing 02/09/2022 INITIAL  3 Pt will be able to perform transfer movements without exacerbating pain Baseline:Pt increases pain with supine to sit and transfer out of car up to 9/10 pain 02/09/2022 INITIAL   LONG TERM GOALS:   LTG Name Target Date Goal status  1 Pt will be independent with advanced HEP in order to continue exercise with weights at a gym Baseline: Pt unable to exercise due to Left SI pain and bil LB pain 03/02/2022 INITIAL  2 Pt will not wake due to pain while turning in bed while sleeping at night Baseline: 03/02/2022 INITIAL  3 FOTO will improve from 52%   to 74%    indicating improved functional mobility.  Baseline: 03/02/2022 INITIAL  4  Pt will be able to lift 25 # from floor in order to handle heavy groceries at home Baseline: 03/02/2022 INITIAL  5 Pt will be able to negotiate steps without  exacerbating pain Baseline:Pt avoids steps due to L SI jt pain 03/02/2022 INITIAL  6 Pt will be able to perform household chores with proper body mechanics to decrease irritation of L SI and increase lumbar/sacral stability Baseline: 03/02/2022 INITIAL   PLAN: PT FREQUENCY: 2x/week  PT DURATION: 6 weeks  PLANNED INTERVENTIONS: Therapeutic exercises, Therapeutic activity, Neuro Muscular re-education, Balance training, Gait training, Patient/Family education, Joint mobilization, Stair training, Dry Needling, Electrical stimulation, Cryotherapy, Moist heat, Taping, Ionotophoresis 57m/ml Dexamethasone, Manual therapy, and Lumbar/Sacral Stabilization  PLAN FOR NEXT SESSION: May use SI taping over Left SI, try resisted Hamstrings for reducing pain in left SI, bridge with ball squeez, review exercises. Abdominal sets. Pelvic tilt, bent knee fall out  LVoncille Lo PT, APuerto Rico Childrens HospitalCertified Exercise Expert for the Aging Adult  01/19/22 3:55 PM Phone: [phone]: [phone]

## 2022-01-24 ENCOUNTER — Ambulatory Visit: Payer: 59 | Admitting: Physical Therapy

## 2022-01-25 NOTE — Therapy (Signed)
OUTPATIENT PHYSICAL THERAPY TREATMENT NOTE   Patient Name: Karen Burnett MRN: [id number] DOB:[birth year], 26 y.o., female Today's Date: 01/26/2022  PCP: Rise Patience, DO REFERRING PROVIDER: Zenia Resides, MD   PT End of Session - 01/26/22 [phone]     Visit Number 2    Number of Visits 12    Date for PT Re-Evaluation 03/02/22    Authorization Type UMR    PT Start Time 0934    PT Stop Time 1015    PT Time Calculation (min) 41 min    Activity Tolerance Patient tolerated treatment well    Behavior During Therapy [facility] for tasks assessed/performed             Past Medical History:  Diagnosis Date   Anxiety    Ultrasound recheck of fetal pyelectasis, antepartum 05/24/2017   Vaginal Pap smear, abnormal 10/2016   ASCUS, HPV neg so needs routine screen   Past Surgical History:  Procedure Laterality Date   NO PAST SURGERIES     Patient Active Problem List   Diagnosis Date Noted   SI (sacroiliac) joint dysfunction 01/09/2022   Upper back pain 03/11/2021   Overweight 12/19/2018   Generalized anxiety disorder 01/02/2017   Family history of diabetes mellitus 10/01/2015   Nexplanon insertion 06/24/2015    REFERRING DIAG: M53.3 (ICD-10-CM) - SI (sacroiliac) joint dysfunction  THERAPY DIAG:  Acute right-sided low back pain with sciatica, sciatica laterality unspecified  Muscle weakness (generalized)  Difficulty in walking, not elsewhere classified  PERTINENT HISTORY: SI problems since high school, Nothing remarkable  PRECAUTIONS: None  SUBJECTIVE: I need to adjust my back when I am getting out of bed and up from my chair in my office  PAIN:  Are you having pain? Yes NPRS scale: 0/10 to 2/10 at rest  at worst 7/10 Pain location: Left SI R>L pubic pain on palpation Pain orientation: Other: R>L pubic pain,  L SI    PAIN TYPE: acute on chronic Pain description: intermittent, sharp, stabbing, and shooting   Aggravating factors: bending over, picking up kids and  groceries, sweeping and mopping going up and  down stairs Relieving factors: not moving, lying down    OBJECTIVE:    DIAGNOSTIC FINDINGS:  none   PATIENT SURVEYS:  FOTO 52% prediced 74%   SCREENING FOR RED FLAGS: Bowel or bladder incontinence: No Spinal tumors: No Cauda equina syndrome: No Compression fracture: No Abdominal aneurysm: No   COGNITION:          Overall cognitive status: Within functional limits for tasks assessed                        SENSATION:          Light touch: Appears intact          Stereognosis: Appears intact          Hot/Cold: Appears intact          Proprioception: Appears intact   MUSCLE LENGTH: Hamstrings: Right 75 deg; Left 60 deg pain in Left SI Thomas test: WNL   POSTURE:           Left SI more posterior than R anteriorly rotated. Slightly elevated R pelvic level. Left Sacral rotaion R pubic bone more superior and anterior 2-3/23 R SI more posterior    PALPATION: TTP on on  L SI and Left QL. Left SI [place] test L doesn't move , pain ful pubic symphysis   LUMBARAROM/PROM   A/PROM  A/PROM  01/19/2022  Flexion Can touch ankle bil Pain on returning to upright  Extension WNL  Right lateral flexion 2 in finger tip below knee jt line P with returning to upright posture  Left lateral flexion 2.5 finger tip to below knee jt line  Right rotation WNL  Left rotation WNL   (Blank rows = not tested)   LE AROM/PROM:                               AROM WNL throughout LE A/PROM Right 01/19/2022 Left 01/19/2022  Hip flexion 123 117 pain on L SI  Hip extension      Hip abduction      Hip adduction      Hip internal rotation      Hip external rotation      Knee flexion      Knee extension      Ankle dorsiflexion      Ankle plantarflexion      Ankle inversion      Ankle eversion       (Blank rows = not tested)   LE MMT:                     Left pain in SI with any movement MMT Right 01/19/2022 Left 01/19/2022  Hip flexion 5/5 4/5 pain in  Left SI  Hip extension 4+/5 4+/5  Hip abduction 4+/5 4/5  Hip adduction      Hip internal rotation      Hip external rotation      Knee flexion 5/5 5/5  Knee extension 5/5 5/5  Ankle dorsiflexion 5/5 5/5  Ankle plantarflexion 5/5 5/5  Ankle inversion      Ankle eversion       (Blank rows = not tested)   LUMBAR SPECIAL TESTS:  Straight leg raise test: Positive, Slump test: Negative, SI Compression/distraction test: Positive, and FABER test: Positive   FUNCTIONAL TESTS:  Squat pt unable to squat fully wt bears to R   GAIT: Distance walked: 150 Assistive device utilized: None Level of assistance: Complete Independence Comments: Pt walks slowly but normally to not exacerbate L SI pain       TODAY'S TREATMENT    [facility] Adult PT Treatment:                                                DATE: 01-26-22 Therapeutic Exercise:  Working on proper transfer on and off mat   Supine Double Knee to Chest 10 reps - 10  sechold Abdominal sets. 10 sec hold x 5 Pelvic tilt x 10 bent knee fall out 1 x 10 on R and L Supine Lower Trunk Rotation - 1-2 x daily - 7 x weekly - 1 sets - 5 reps - 20 sec hold hold Supine Piriformis Stretch Pulling Heel to Hip - 1-2 x daily - 7 x weekly - 1 sets - 2-3 reps - 30 sec hold Supine Bridge with Mini Swiss Ball Between Knees - 1 x daily - 7 x weekly - 3 sets - 10 reps Single Leg Bridge - 1 x daily - 7 x weekly - 3 sets - 10 reps Quadruped Rocking Slow - 1 x daily - 7 x weekly - 3 sets - 10 reps Quadruped Forearm  Plank with a [employer] 3 sets - 10 reps Quadruped Hip Abduction with Resistance Loop - 1 x daily - 7 x weekly - 3 sets - 10 reps  Manual Therapy: MET  to  address Left SI pain,  Right sidelying lumbar sacral thrust with caviation STW to Left lumbosacral/Left QL   Modalities: Moist heat post TPDN   Trigger Point Dry Needling Treatment: Pre-treatment instruction: Patient instructed on dry needling rationale, procedures, and possible side  effects including pain during treatment (achy,cramping feeling), bruising, drop of blood, lightheadedness, nausea, sweating. Patient Consent Given: Yes Education handout provided: Previously provided Muscles treated: Lumbar L 5/piriformis L  L QL Needle size and number: .30x 75 mm x 3 Electrical stimulation performed: No Parameters: N/A Treatment response/outcome: Twitch response elicited, Palpable decrease in muscle tension, and increased motion with less pain Post-treatment instructions: Patient instructed to expect possible mild to moderate muscle soreness later today and/or tomorrow. Patient instructed in methods to reduce muscle soreness and to continue prescribed HEP. If patient was dry needled over the lung field, patient was instructed on signs and symptoms of pneumothorax and, however unlikely, to see immediate medical attention should they occur. Patient was also educated on signs and symptoms of infection and to seek medical attention should they occur. Patient verbalized understanding of these instructions and education.   PATIENT EDUCATION:  Education details: POC  went over FOTO report, given HEP (basic Pre Pilates) updated HEP for strengthening and stretching Person educated: Patient Education method: Explanation, Demonstration, Tactile cues, Verbal cues, and Handouts Education comprehension: verbalized understanding, returned demonstration, verbal cues required, tactile cues required, and needs further education     HOME EXERCISE PROGRAM: Pre pilates   Pelvic Tilt, abdominal bracing, Knee fold and Knee drop Access Code: [email or web address] URL: [email or web address] Date: 01/26/2022 Prepared by: Voncille Lo  Program Notes You can always do the pre pilates exercises given when you are tender.   Exercises Supine Double Knee to Chest - 1-2 x daily - 7 x weekly - 1 sets - 5-10 reps - 10 hold Supine Lower Trunk Rotation - 1-2 x daily - 7 x weekly - 1 sets - 5  reps Supine Piriformis Stretch Pulling Heel to Hip - 1-2 x daily - 7 x weekly - 1 sets - 23 reps - 30 sec hold Supine Bridge with Mini Swiss Ball Between Knees - 1 x daily - 7 x weekly - 3 sets - 10 reps Single Leg Bridge - 1 x daily - 7 x weekly - 3 sets - 10 reps Quadruped Rocking Slow - 1 x daily - 7 x weekly - 3 sets - 10 reps Quadruped Forearm Plank with a Mule Kick - 1 x daily - 7 x weekly - 3 sets - 10 reps Quadruped Hip Abduction with Resistance Loop - 1 x daily - 7 x weekly - 3 sets - 10 reps     ASSESSMENT:   CLINICAL IMPRESSION:  Ms Boye presents with 0-2/10 pain at rest but 7/10 at times.  At end of session pt had relief of pain and was educated with demo of HEP for strength and lumbar sacral stability.  PT was able to decrease pain with Manual, HEP and TPDN. Pt consented to TPDN and was closely monitored throughout session. Pt was able to perform all exercise without any adverse effect   Objective impairments include decreased activity tolerance, decreased mobility, difficulty walking, decreased strength, increased fascial restrictions, improper body mechanics, postural dysfunction, and pain. These impairments are limiting  patient from cleaning, driving, occupation, laundry, shopping, and cannot exercise at gym . Personal factors including  Patient will benefit from skilled PT to address above impairments and improve overall function.      GOALS: Goals reviewed with patient? Yes   SHORT TERM GOALS:   STG Name Target Date Goal status  1 Pt will be independent with initial HEP Baseline: need HEP 02/09/2022 INITIAL  2 Sit and stand with RT=LT wt bearing to reduce lumbar/sacral strain and allow for increased tolerance for these positions for work tasks Baseline: Wt bears to Right when standing 02/09/2022 INITIAL  3 Pt will be able to perform transfer movements without exacerbating pain Baseline:Pt increases pain with supine to sit and transfer out of car up to 9/10 pain  02/09/2022 INITIAL    LONG TERM GOALS:    LTG Name Target Date Goal status  1 Pt will be independent with advanced HEP in order to continue exercise with weights at a gym Baseline: Pt unable to exercise due to Left SI pain and bil LB pain 03/02/2022 INITIAL  2 Pt will not wake due to pain while turning in bed while sleeping at night Baseline: 03/02/2022 INITIAL  3 FOTO will improve from 52%   to 74%    indicating improved functional mobility.  Baseline: 03/02/2022 INITIAL  4 Pt will be able to lift 25 # from floor in order to handle heavy groceries at home Baseline: 03/02/2022 INITIAL  5 Pt will be able to negotiate steps without exacerbating pain Baseline:Pt avoids steps due to L SI jt pain 03/02/2022 INITIAL  6 Pt will be able to perform household chores with proper body mechanics to decrease irritation of L SI and increase lumbar/sacral stability Baseline: 03/02/2022 INITIAL    PLAN: PT FREQUENCY: 2x/week   PT DURATION: 6 weeks   PLANNED INTERVENTIONS: Therapeutic exercises, Therapeutic activity, Neuro Muscular re-education, Balance training, Gait training, Patient/Family education, Joint mobilization, Stair training, Dry Needling, Electrical stimulation, Cryotherapy, Moist heat, Taping, Ionotophoresis 25m/ml Dexamethasone, Manual therapy, and Lumbar/Sacral Stabilization   PLAN FOR NEXT SESSION: May use SI taping over Left SI, try resisted Hamstrings for reducing pain in left SI, bridge with ball squeez, review exercises. Progress HEP with weights as able       LVoncille Lo PT, ANorth Kansas CityCertified Exercise Expert for the Aging Adult  01/26/22 1:44 PM Phone: [phone]: [phone]

## 2022-01-26 ENCOUNTER — Encounter: Payer: Self-pay | Admitting: Physical Therapy

## 2022-01-26 ENCOUNTER — Ambulatory Visit: Payer: 59 | Admitting: Physical Therapy

## 2022-01-26 ENCOUNTER — Other Ambulatory Visit: Payer: Self-pay

## 2022-01-26 DIAGNOSIS — R262 Difficulty in walking, not elsewhere classified: Secondary | ICD-10-CM | POA: Diagnosis not present

## 2022-01-26 DIAGNOSIS — M6281 Muscle weakness (generalized): Secondary | ICD-10-CM

## 2022-01-26 DIAGNOSIS — M5440 Lumbago with sciatica, unspecified side: Secondary | ICD-10-CM

## 2022-01-26 DIAGNOSIS — M533 Sacrococcygeal disorders, not elsewhere classified: Secondary | ICD-10-CM | POA: Diagnosis not present

## 2022-01-26 NOTE — Patient Instructions (Signed)
Access Code: [email or web address] URL: [email or web address] Date: 01/26/2022 Prepared by: Garen Lah  Program Notes You can always do the pre pilates exercises given when you are tender.   Exercises Supine Double Knee to Chest - 1-2 x daily - 7 x weekly - 1 sets - 5-10 reps - 10 hold Supine Lower Trunk Rotation - 1-2 x daily - 7 x weekly - 1 sets - 5 reps Supine Piriformis Stretch Pulling Heel to Hip - 1-2 x daily - 7 x weekly - 1 sets - 23 reps - 30 sec hold Supine Bridge with Mini Swiss Ball Between Knees - 1 x daily - 7 x weekly - 3 sets - 10 reps Single Leg Bridge - 1 x daily - 7 x weekly - 3 sets - 10 reps Quadruped Rocking Slow - 1 x daily - 7 x weekly - 3 sets - 10 reps Quadruped Forearm Plank with a Mule Kick - 1 x daily - 7 x weekly - 3 sets - 10 reps Quadruped Hip Abduction with Resistance Loop - 1 x daily - 7 x weekly - 3 sets - 10 reps    Garen Lah, PT, [facility] Certified Exercise Expert for the Aging Adult  01/26/22 9:51 AM Phone: [phone] Fax: [phone]

## 2022-01-30 ENCOUNTER — Encounter: Payer: Self-pay | Admitting: Physical Therapy

## 2022-01-30 ENCOUNTER — Ambulatory Visit: Payer: 59 | Admitting: Physical Therapy

## 2022-01-30 ENCOUNTER — Other Ambulatory Visit: Payer: Self-pay

## 2022-01-30 DIAGNOSIS — R262 Difficulty in walking, not elsewhere classified: Secondary | ICD-10-CM | POA: Diagnosis not present

## 2022-01-30 DIAGNOSIS — M6281 Muscle weakness (generalized): Secondary | ICD-10-CM

## 2022-01-30 DIAGNOSIS — M5440 Lumbago with sciatica, unspecified side: Secondary | ICD-10-CM

## 2022-01-30 DIAGNOSIS — M533 Sacrococcygeal disorders, not elsewhere classified: Secondary | ICD-10-CM | POA: Diagnosis not present

## 2022-01-30 NOTE — Therapy (Signed)
OUTPATIENT PHYSICAL THERAPY TREATMENT NOTE   Patient Name: Karen Burnett MRN: [id number] DOB:02/09/1996, 26 y.o., female Today's Date: 01/30/2022  PCP: Rise Patience, DO REFERRING PROVIDER: Rise Patience, DO   PT End of Session - 01/30/22 1234     Visit Number 3    Number of Visits 12    Date for PT Re-Evaluation 03/02/22    Authorization Type U[employer]    PT Start Time 1230    PT Stop Time 3845    PT Time Calculation (min) 42 min             Past Medical History:  Diagnosis Date   Anxiety    Ultrasound recheck of fetal pyelectasis, antepartum 05/24/2017   Vaginal Pap smear, abnormal 10/2016   ASCUS, HPV neg so needs routine screen   Past Surgical History:  Procedure Laterality Date   NO PAST SURGERIES     Patient Active Problem List   Diagnosis Date Noted   SI (sacroiliac) joint dysfunction 01/09/2022   Upper back pain 03/11/2021   Overweight 12/19/2018   Generalized anxiety disorder 01/02/2017   Family history of diabetes mellitus 10/01/2015   Nexplanon insertion 06/24/2015    REFERRING DIAG: M53.3 (ICD-10-CM) - SI (sacroiliac) joint dysfunction  THERAPY DIAG:  Acute right-sided low back pain with sciatica, sciatica laterality unspecified  Muscle weakness (generalized)  Difficulty in walking, not elsewhere classified  PERTINENT HISTORY: SI problems since high school, Nothing remarkable  PRECAUTIONS: None  SUBJECTIVE: I am having a pin point pain in my left foot that I can then feel in my left thigh and radiates to my left low back. My pain is a 4/10 mow.  PAIN:  Are you having pain? Yes NPRS scale: 4/10 at worst 7/10 Pain location: Left SI  Pain orientation: Other: R>L pubic pain,  L SI    PAIN TYPE: acute on chronic Pain description: intermittent, sharp, stabbing, and shooting   Aggravating factors: bending over, picking up kids and groceries, sweeping and mopping going up and  down stairs Relieving factors: not moving, lying down    OBJECTIVE:     DIAGNOSTIC FINDINGS:  none   PATIENT SURVEYS:  FOTO 52% prediced 74%   SCREENING FOR RED FLAGS: Bowel or bladder incontinence: No Spinal tumors: No Cauda equina syndrome: No Compression fracture: No Abdominal aneurysm: No   COGNITION:          Overall cognitive status: Within functional limits for tasks assessed                        SENSATION:          Light touch: Appears intact          Stereognosis: Appears intact          Hot/Cold: Appears intact          Proprioception: Appears intact   MUSCLE LENGTH: Hamstrings: Right 75 deg; Left 60 deg pain in Left SI Thomas test: WNL   POSTURE:           Left SI more posterior than R anteriorly rotated. Slightly elevated R pelvic level. Left Sacral rotaion R pubic bone more superior and anterior 2-3/23 R SI more posterior    PALPATION: TTP on on  L SI and Left QL. Left SI Gillette test L doesn't move , pain ful pubic symphysis   LUMBARAROM/PROM   A/PROM A/PROM  01/19/2022  Flexion Can touch ankle bil Pain on returning to upright  Extension WNL  Right lateral flexion 2 in finger tip below knee jt line P with returning to upright posture  Left lateral flexion 2.5 finger tip to below knee jt line  Right rotation WNL  Left rotation WNL   (Blank rows = not tested)   LE AROM/PROM:                               AROM WNL throughout LE A/PROM Right 01/19/2022 Left 01/19/2022  Hip flexion 123 117 pain on L SI  Hip extension      Hip abduction      Hip adduction      Hip internal rotation      Hip external rotation      Knee flexion      Knee extension      Ankle dorsiflexion      Ankle plantarflexion      Ankle inversion      Ankle eversion       (Blank rows = not tested)   LE MMT:                     Left pain in SI with any movement MMT Right 01/19/2022 Left 01/19/2022  Hip flexion 5/5 4/5 pain in Left SI  Hip extension 4+/5 4+/5  Hip abduction 4+/5 4/5  Hip adduction      Hip internal rotation      Hip  external rotation      Knee flexion 5/5 5/5  Knee extension 5/5 5/5  Ankle dorsiflexion 5/5 5/5  Ankle plantarflexion 5/5 5/5  Ankle inversion      Ankle eversion       (Blank rows = not tested)   LUMBAR SPECIAL TESTS:  Straight leg raise test: Positive, Slump test: Negative, SI Compression/distraction test: Positive, and FABER test: Positive   FUNCTIONAL TESTS:  Squat pt unable to squat fully wt bears to R   GAIT: Distance walked: 150 Assistive device utilized: None Level of assistance: Complete Independence Comments: Pt walks slowly but normally to not exacerbate L SI pain       TODAY'S TREATMENT    [facility] Adult PT Treatment:                                                DATE: 01-30-22  Manual therapy: MET 2 different options to correct pelvis -pt reported improvement in pain after MET.   Therapeutic Exercise:  Bridge with AROM clam 2 sets of 10 Blue band hooklying clam Blue band bridge with clams during lift 2 rounds of 10 Alternating bent knee fall out with blue band Bridge with ball squeeze-arms crossed x 10 Piriformis stretch bilat Figure 4 stretch  bilat Sit-stand x15 Sit-stand blue band  at knees x10, x 10 with 8# dumbbell Bird dog x 10-fatigues Physioball crunch -roll up thighs x 10, x 5  Double knee to chest    Discussed getting out of chair at desk with equal weight bearing. Tends to get up to the left.   [facility] Adult PT Treatment:  DATE: 01-26-22 Therapeutic Exercise:  Working on proper transfer on and off mat   Supine Double Knee to Chest 10 reps - 10  sechold Abdominal sets. 10 sec hold x 5 Pelvic tilt x 10 bent knee fall out 1 x 10 on R and L Supine Lower Trunk Rotation - 1-2 x daily - 7 x weekly - 1 sets - 5 reps - 20 sec hold hold Supine Piriformis Stretch Pulling Heel to Hip - 1-2 x daily - 7 x weekly - 1 sets - 2-3 reps - 30 sec hold Supine Bridge with Mini Swiss Ball Between Knees - 1 x daily - 7 x  weekly - 3 sets - 10 reps Single Leg Bridge - 1 x daily - 7 x weekly - 3 sets - 10 reps Quadruped Rocking Slow - 1 x daily - 7 x weekly - 3 sets - 10 reps Quadruped Forearm Plank with a [employer] 3 sets - 10 reps Quadruped Hip Abduction with Resistance Loop - 1 x daily - 7 x weekly - 3 sets - 10 reps  Manual Therapy: MET  to  address Left SI pain,  Right sidelying lumbar sacral thrust with caviation STW to Left lumbosacral/Left QL   Modalities: Moist heat post TPDN   Trigger Point Dry Needling Treatment: Pre-treatment instruction: Patient instructed on dry needling rationale, procedures, and possible side effects including pain during treatment (achy,cramping feeling), bruising, drop of blood, lightheadedness, nausea, sweating. Patient Consent Given: Yes Education handout provided: Previously provided Muscles treated: Lumbar L 5/piriformis L  L QL Needle size and number: .30x 75 mm x 3 Electrical stimulation performed: No Parameters: N/A Treatment response/outcome: Twitch response elicited, Palpable decrease in muscle tension, and increased motion with less pain Post-treatment instructions: Patient instructed to expect possible mild to moderate muscle soreness later today and/or tomorrow. Patient instructed in methods to reduce muscle soreness and to continue prescribed HEP. If patient was dry needled over the lung field, patient was instructed on signs and symptoms of pneumothorax and, however unlikely, to see immediate medical attention should they occur. Patient was also educated on signs and symptoms of infection and to seek medical attention should they occur. Patient verbalized understanding of these instructions and education.   PATIENT EDUCATION:  Education details: POC  went over FOTO report, given HEP (basic Pre Pilates) updated HEP for strengthening and stretching Person educated: Patient Education method: Explanation, Demonstration, Tactile cues, Verbal cues, and  Handouts Education comprehension: verbalized understanding, returned demonstration, verbal cues required, tactile cues required, and needs further education     HOME EXERCISE PROGRAM: Pre pilates   Pelvic Tilt, abdominal bracing, Knee fold and Knee drop Access Code: [email or web address] URL: [email or web address] Date: 01/26/2022 Prepared by: Voncille Lo  Program Notes You can always do the pre pilates exercises given when you are tender.   Exercises Supine Double Knee to Chest - 1-2 x daily - 7 x weekly - 1 sets - 5-10 reps - 10 hold Supine Lower Trunk Rotation - 1-2 x daily - 7 x weekly - 1 sets - 5 reps Supine Piriformis Stretch Pulling Heel to Hip - 1-2 x daily - 7 x weekly - 1 sets - 23 reps - 30 sec hold Supine Bridge with Mini Swiss Ball Between Knees - 1 x daily - 7 x weekly - 3 sets - 10 reps Single Leg Bridge - 1 x daily - 7 x weekly - 3 sets - 10 reps Quadruped Rocking Slow - 1 x daily - 7 x  weekly - 3 sets - 10 reps Quadruped Forearm Plank with a Mule Kick - 1 x daily - 7 x weekly - 3 sets - 10 reps Quadruped Hip Abduction with Resistance Loop - 1 x daily - 7 x weekly - 3 sets - 10 reps     ASSESSMENT:   CLINICAL IMPRESSION: Pt reports 4/10 pain at start of session which has increased today while working at her desk. Upon palpation noted left rotated inominate and this was corrected with MET. After ward pt reported significant improvement.  Continued with stabilization focus for remaining session.   Objective impairments include decreased activity tolerance, decreased mobility, difficulty walking, decreased strength, increased fascial restrictions, improper body mechanics, postural dysfunction, and pain. These impairments are limiting patient from cleaning, driving, occupation, laundry, shopping, and cannot exercise at gym . Personal factors including  Patient will benefit from skilled PT to address above impairments and improve overall function.       GOALS: Goals reviewed with patient? Yes   SHORT TERM GOALS:   STG Name Target Date Goal status  1 Pt will be independent with initial HEP Baseline: need HEP 02/09/2022 INITIAL  2 Sit and stand with RT=LT wt bearing to reduce lumbar/sacral strain and allow for increased tolerance for these positions for work tasks Baseline: Wt bears to Right when standing 02/09/2022 INITIAL  3 Pt will be able to perform transfer movements without exacerbating pain Baseline:Pt increases pain with supine to sit and transfer out of car up to 9/10 pain 02/09/2022 INITIAL    LONG TERM GOALS:    LTG Name Target Date Goal status  1 Pt will be independent with advanced HEP in order to continue exercise with weights at a gym Baseline: Pt unable to exercise due to Left SI pain and bil LB pain 03/02/2022 INITIAL  2 Pt will not wake due to pain while turning in bed while sleeping at night Baseline: 03/02/2022 INITIAL  3 FOTO will improve from 52%   to 74%    indicating improved functional mobility.  Baseline: 03/02/2022 INITIAL  4 Pt will be able to lift 25 # from floor in order to handle heavy groceries at home Baseline: 03/02/2022 INITIAL  5 Pt will be able to negotiate steps without exacerbating pain Baseline:Pt avoids steps due to L SI jt pain 03/02/2022 INITIAL  6 Pt will be able to perform household chores with proper body mechanics to decrease irritation of L SI and increase lumbar/sacral stability Baseline: 03/02/2022 INITIAL    PLAN: PT FREQUENCY: 2x/week   PT DURATION: 6 weeks   PLANNED INTERVENTIONS: Therapeutic exercises, Therapeutic activity, Neuro Muscular re-education, Balance training, Gait training, Patient/Family education, Joint mobilization, Stair training, Dry Needling, Electrical stimulation, Cryotherapy, Moist heat, Taping, Ionotophoresis 105m/ml Dexamethasone, Manual therapy, and Lumbar/Sacral Stabilization   PLAN FOR NEXT SESSION: May use SI taping over Left SI, try resisted Hamstrings for  reducing pain in left SI, bridge with ball squeez, review exercises. Progress HEP with weights as able       JHessie Diener PTA 01/30/22 1:12 PM Phone: [phone]: [phone]

## 2022-02-02 ENCOUNTER — Ambulatory Visit: Payer: 59 | Admitting: Physical Therapy

## 2022-02-02 ENCOUNTER — Encounter: Payer: Self-pay | Admitting: Physical Therapy

## 2022-02-02 ENCOUNTER — Other Ambulatory Visit: Payer: Self-pay

## 2022-02-02 DIAGNOSIS — M6281 Muscle weakness (generalized): Secondary | ICD-10-CM | POA: Diagnosis not present

## 2022-02-02 DIAGNOSIS — M5440 Lumbago with sciatica, unspecified side: Secondary | ICD-10-CM

## 2022-02-02 DIAGNOSIS — R262 Difficulty in walking, not elsewhere classified: Secondary | ICD-10-CM

## 2022-02-02 DIAGNOSIS — M533 Sacrococcygeal disorders, not elsewhere classified: Secondary | ICD-10-CM | POA: Diagnosis not present

## 2022-02-02 NOTE — Therapy (Signed)
OUTPATIENT PHYSICAL THERAPY TREATMENT NOTE   Patient Name: Karen Burnett MRN: [id number] DOB:1996-12-15, 26 y.o., female Today's Date: 02/02/2022  PCP: Rise Patience, DO REFERRING PROVIDER: Zenia Resides, MD   PT End of Session - 02/02/22 1155     Visit Number 4    Number of Visits 12    Date for PT Re-Evaluation 03/02/22    Authorization Type UMR    PT Start Time 1150    PT Stop Time 1230    PT Time Calculation (min) 40 min             Past Medical History:  Diagnosis Date   Anxiety    Ultrasound recheck of fetal pyelectasis, antepartum 05/24/2017   Vaginal Pap smear, abnormal 10/2016   ASCUS, HPV neg so needs routine screen   Past Surgical History:  Procedure Laterality Date   NO PAST SURGERIES     Patient Active Problem List   Diagnosis Date Noted   SI (sacroiliac) joint dysfunction 01/09/2022   Upper back pain 03/11/2021   Overweight 12/19/2018   Generalized anxiety disorder 01/02/2017   Family history of diabetes mellitus 10/01/2015   Nexplanon insertion 06/24/2015    REFERRING DIAG: M53.3 (ICD-10-CM) - SI (sacroiliac) joint dysfunction  THERAPY DIAG:  Acute right-sided low back pain with sciatica, sciatica laterality unspecified  Muscle weakness (generalized)  Difficulty in walking, not elsewhere classified  PERTINENT HISTORY: SI problems since high school, Nothing remarkable  PRECAUTIONS: None  SUBJECTIVE: My back was progressively worsening on left side yesterday so I used a bat to perform the muscle energy technique and it worked. My pain went away afterward. I do notice that I use my left side a lot at work with getting up and down so I am being more conscious of using both legs when I get up.   PAIN:  Are you having pain? Yes NPRS scale: 0/10 at worst 7/10 Pain location: Left SI  Pain orientation: Other: R>L pubic pain,  L SI    PAIN TYPE: acute on chronic Pain description: intermittent, sharp, stabbing, and shooting   Aggravating  factors: bending over, picking up kids and groceries, sweeping and mopping going up and  down stairs Relieving factors: not moving, lying down    OBJECTIVE:    DIAGNOSTIC FINDINGS:  none   PATIENT SURVEYS:  FOTO 52% prediced 74%   SCREENING FOR RED FLAGS: Bowel or bladder incontinence: No Spinal tumors: No Cauda equina syndrome: No Compression fracture: No Abdominal aneurysm: No   COGNITION:          Overall cognitive status: Within functional limits for tasks assessed                        SENSATION:          Light touch: Appears intact          Stereognosis: Appears intact          Hot/Cold: Appears intact          Proprioception: Appears intact   MUSCLE LENGTH: Hamstrings: Right 75 deg; Left 60 deg pain in Left SI Thomas test: WNL   POSTURE:           Left SI more posterior than R anteriorly rotated. Slightly elevated R pelvic level. Left Sacral rotaion R pubic bone more superior and anterior 2-3/23 R SI more posterior    PALPATION: TTP on on  L SI and Left QL. Left SI Gillette test L doesn't  move , pain ful pubic symphysis   LUMBARAROM/PROM   A/PROM A/PROM  01/19/2022  Flexion Can touch ankle bil Pain on returning to upright  Extension WNL  Right lateral flexion 2 in finger tip below knee jt line P with returning to upright posture  Left lateral flexion 2.5 finger tip to below knee jt line  Right rotation WNL  Left rotation WNL   (Blank rows = not tested)   LE AROM/PROM:                               AROM WNL throughout LE A/PROM Right 01/19/2022 Left 01/19/2022  Hip flexion 123 117 pain on L SI  Hip extension      Hip abduction      Hip adduction      Hip internal rotation      Hip external rotation      Knee flexion      Knee extension      Ankle dorsiflexion      Ankle plantarflexion      Ankle inversion      Ankle eversion       (Blank rows = not tested)   LE MMT:                     Left pain in SI with any movement MMT Right 01/19/2022  Left 01/19/2022  Hip flexion 5/5 4/5 pain in Left SI  Hip extension 4+/5 4+/5  Hip abduction 4+/5 4/5  Hip adduction      Hip internal rotation      Hip external rotation      Knee flexion 5/5 5/5  Knee extension 5/5 5/5  Ankle dorsiflexion 5/5 5/5  Ankle plantarflexion 5/5 5/5  Ankle inversion      Ankle eversion       (Blank rows = not tested)   LUMBAR SPECIAL TESTS:  Straight leg raise test: Positive, Slump test: Negative, SI Compression/distraction test: Positive, and FABER test: Positive   FUNCTIONAL TESTS:  Squat pt unable to squat fully wt bears to R   GAIT: Distance walked: 150 Assistive device utilized: None Level of assistance: Complete Independence Comments: Pt walks slowly but normally to not exacerbate L SI pain       TODAY'S TREATMENT    [facility] Adult PT Treatment:                                                DATE: 02-02-22  Hips aligned at start of session: no MET needed  Therapeutic Exercise:  Physioball crunch -roll up thighs x 20 Bridge with ankles on ball 5 sec x 10  Heels on physioball- knee flexion with abdominal focus x 20 -  Blue band hooklying clam Blue band bridge with clams during lift 3 rounds of 10 Alternating bent knee fall out with blue band Bridge with ball squeeze-arms crossed x 10 Bird dog x 10- on airex  Sit-stand blue band  at knees 3 x 10 with 10# dumbbell at chest Piriformis stretch bilat Figure 4 stretch  bilat Double knee to chest      [facility] Adult PT Treatment:  DATE: 01-30-22  Manual therapy: MET 2 different options to correct pelvis -pt reported improvement in pain after MET.   Therapeutic Exercise:  Bridge with AROM clam 2 sets of 10 Blue band hooklying clam Blue band bridge with clams during lift 2 rounds of 10 Alternating bent knee fall out with blue band Bridge with ball squeeze-arms crossed x 10 Piriformis stretch bilat Figure 4 stretch  bilat Sit-stand x15 Sit-stand  blue band  at knees x10, x 10 with 8# dumbbell Bird dog x 10-fatigues Physioball crunch -roll up thighs x 10, x 5  Double knee to chest    Discussed getting out of chair at desk with equal weight bearing. Tends to get up to the left.   [facility] Adult PT Treatment:                                                DATE: 01-26-22 Therapeutic Exercise:  Working on proper transfer on and off mat   Supine Double Knee to Chest 10 reps - 10  sechold Abdominal sets. 10 sec hold x 5 Pelvic tilt x 10 bent knee fall out 1 x 10 on R and L Supine Lower Trunk Rotation - 1-2 x daily - 7 x weekly - 1 sets - 5 reps - 20 sec hold hold Supine Piriformis Stretch Pulling Heel to Hip - 1-2 x daily - 7 x weekly - 1 sets - 2-3 reps - 30 sec hold Supine Bridge with Mini Swiss Ball Between Knees - 1 x daily - 7 x weekly - 3 sets - 10 reps Single Leg Bridge - 1 x daily - 7 x weekly - 3 sets - 10 reps Quadruped Rocking Slow - 1 x daily - 7 x weekly - 3 sets - 10 reps Quadruped Forearm Plank with a [employer] 3 sets - 10 reps Quadruped Hip Abduction with Resistance Loop - 1 x daily - 7 x weekly - 3 sets - 10 reps  Manual Therapy: MET  to  address Left SI pain,  Right sidelying lumbar sacral thrust with caviation STW to Left lumbosacral/Left QL   Modalities: Moist heat post TPDN   Trigger Point Dry Needling Treatment: Pre-treatment instruction: Patient instructed on dry needling rationale, procedures, and possible side effects including pain during treatment (achy,cramping feeling), bruising, drop of blood, lightheadedness, nausea, sweating. Patient Consent Given: Yes Education handout provided: Previously provided Muscles treated: Lumbar L 5/piriformis L  L QL Needle size and number: .30x 75 mm x 3 Electrical stimulation performed: No Parameters: N/A Treatment response/outcome: Twitch response elicited, Palpable decrease in muscle tension, and increased motion with less pain Post-treatment instructions: Patient  instructed to expect possible mild to moderate muscle soreness later today and/or tomorrow. Patient instructed in methods to reduce muscle soreness and to continue prescribed HEP. If patient was dry needled over the lung field, patient was instructed on signs and symptoms of pneumothorax and, however unlikely, to see immediate medical attention should they occur. Patient was also educated on signs and symptoms of infection and to seek medical attention should they occur. Patient verbalized understanding of these instructions and education.   PATIENT EDUCATION:  Education details: POC  went over FOTO report, given HEP (basic Pre Pilates) updated HEP for strengthening and stretching Person educated: Patient Education method: Explanation, Demonstration, Tactile cues, Verbal cues, and Handouts Education comprehension: verbalized understanding,  returned demonstration, verbal cues required, tactile cues required, and needs further education     HOME EXERCISE PROGRAM: Pre pilates   Pelvic Tilt, abdominal bracing, Knee fold and Knee drop Access Code: [email or web address] URL: [email or web address] Date: 01/26/2022 Prepared by: Voncille Lo  Program Notes You can always do the pre pilates exercises given when you are tender.   Exercises Supine Double Knee to Chest - 1-2 x daily - 7 x weekly - 1 sets - 5-10 reps - 10 hold Supine Lower Trunk Rotation - 1-2 x daily - 7 x weekly - 1 sets - 5 reps Supine Piriformis Stretch Pulling Heel to Hip - 1-2 x daily - 7 x weekly - 1 sets - 23 reps - 30 sec hold Supine Bridge with Mini Swiss Ball Between Knees - 1 x daily - 7 x weekly - 3 sets - 10 reps Single Leg Bridge - 1 x daily - 7 x weekly - 3 sets - 10 reps Quadruped Rocking Slow - 1 x daily - 7 x weekly - 3 sets - 10 reps Quadruped Forearm Plank with a Mule Kick - 1 x daily - 7 x weekly - 3 sets - 10 reps Quadruped Hip Abduction with Resistance Loop - 1 x daily - 7 x weekly - 3 sets - 10 reps      ASSESSMENT:   CLINICAL IMPRESSION: Pt arrives without pain. She reports she was able to reduce her pain yesterday from 3/10 to 0/10 using MET with baseball bat. Hip level at start of session. She was sore in her abs, quads and hamstrings after last session. Continued with core and hip stabilization. Continued sit-stand progression. She reported feeling muscles burning during therex. No increased pain. Patient will benefit from skilled PT to address above impairments and improve overall function.      GOALS: Goals reviewed with patient? Yes   SHORT TERM GOALS:   STG Name Target Date Goal status  1 Pt will be independent with initial HEP Baseline: need HEP 02/09/2022 INITIAL  2 Sit and stand with RT=LT wt bearing to reduce lumbar/sacral strain and allow for increased tolerance for these positions for work tasks Baseline: Wt bears to Right when standing 02/09/2022 INITIAL  3 Pt will be able to perform transfer movements without exacerbating pain Baseline:Pt increases pain with supine to sit and transfer out of car up to 9/10 pain 02/09/2022 INITIAL    LONG TERM GOALS:    LTG Name Target Date Goal status  1 Pt will be independent with advanced HEP in order to continue exercise with weights at a gym Baseline: Pt unable to exercise due to Left SI pain and bil LB pain 03/02/2022 INITIAL  2 Pt will not wake due to pain while turning in bed while sleeping at night Baseline: 03/02/2022 INITIAL  3 FOTO will improve from 52%   to 74%    indicating improved functional mobility.  Baseline: 03/02/2022 INITIAL  4 Pt will be able to lift 25 # from floor in order to handle heavy groceries at home Baseline: 03/02/2022 INITIAL  5 Pt will be able to negotiate steps without exacerbating pain Baseline:Pt avoids steps due to L SI jt pain 03/02/2022 INITIAL  6 Pt will be able to perform household chores with proper body mechanics to decrease irritation of L SI and increase lumbar/sacral stability Baseline:  03/02/2022 INITIAL    PLAN: PT FREQUENCY: 2x/week   PT DURATION: 6 weeks   PLANNED INTERVENTIONS: Therapeutic exercises, Therapeutic activity, Neuro Muscular re-education,  Balance training, Gait training, Patient/Family education, Joint mobilization, Stair training, Dry Needling, Electrical stimulation, Cryotherapy, Moist heat, Taping, Ionotophoresis 93m/ml Dexamethasone, Manual therapy, and Lumbar/Sacral Stabilization   PLAN FOR NEXT SESSION: May use SI taping over Left SI, try resisted Hamstrings for reducing pain in left SI, bridge with ball squeez, review exercises. Progress HEP with weights as able       JHessie Diener PTA 02/02/22 11:56 AM Phone: [phone]: [phone]

## 2022-02-09 ENCOUNTER — Other Ambulatory Visit: Payer: Self-pay

## 2022-02-09 ENCOUNTER — Ambulatory Visit: Payer: 59 | Admitting: Physical Therapy

## 2022-02-09 ENCOUNTER — Encounter: Payer: Self-pay | Admitting: Physical Therapy

## 2022-02-09 DIAGNOSIS — M6281 Muscle weakness (generalized): Secondary | ICD-10-CM | POA: Diagnosis not present

## 2022-02-09 DIAGNOSIS — M5440 Lumbago with sciatica, unspecified side: Secondary | ICD-10-CM

## 2022-02-09 DIAGNOSIS — R262 Difficulty in walking, not elsewhere classified: Secondary | ICD-10-CM

## 2022-02-09 DIAGNOSIS — M533 Sacrococcygeal disorders, not elsewhere classified: Secondary | ICD-10-CM | POA: Diagnosis not present

## 2022-02-09 NOTE — Therapy (Signed)
OUTPATIENT PHYSICAL THERAPY TREATMENT NOTE   Patient Name: Karen Burnett MRN: [id number] DOB:1996/06/01, 26 y.o., female Today's Date: 02/09/2022  PCP: Rise Patience, DO REFERRING PROVIDER: Rise Patience, DO   PT End of Session - 02/09/22 1150     Visit Number 5    Number of Visits 12    Date for PT Re-Evaluation 03/02/22    Authorization Type [employer]    PT Start Time 1148    PT Stop Time 1230    PT Time Calculation (min) 42 min    Activity Tolerance Patient tolerated treatment well    Behavior During Therapy WFL for tasks assessed/performed              Past Medical History:  Diagnosis Date   Anxiety    Ultrasound recheck of fetal pyelectasis, antepartum 05/24/2017   Vaginal Pap smear, abnormal 10/2016   ASCUS, HPV neg so needs routine screen   Past Surgical History:  Procedure Laterality Date   NO PAST SURGERIES     Patient Active Problem List   Diagnosis Date Noted   SI (sacroiliac) joint dysfunction 01/09/2022   Upper back pain 03/11/2021   Overweight 12/19/2018   Generalized anxiety disorder 01/02/2017   Family history of diabetes mellitus 10/01/2015   Nexplanon insertion 06/24/2015    REFERRING DIAG: M53.3 (ICD-10-CM) - SI (sacroiliac) joint dysfunction  THERAPY DIAG:  Acute right-sided low back pain with sciatica, sciatica laterality unspecified  Muscle weakness (generalized)  Difficulty in walking, not elsewhere classified  PERTINENT HISTORY: SI problems since high school, Nothing remarkable  PRECAUTIONS: None  SUBJECTIVE: I do not have pain today.  I have not had worsening pain for about 10 days.  I am faithfully doing my exercises  PAIN:  Are you having pain? no NPRS scale: 0/10 at 5th visit  at worst 7/10 Pain location: Left SI  Pain orientation: Other: R>L pubic pain,  L SI    PAIN TYPE: acute on chronic Pain description: intermittent, sharp, stabbing, and shooting   Aggravating factors: bending over, picking up kids and groceries,  sweeping and mopping going up and  down stairs Relieving factors: not moving, lying down    OBJECTIVE:    DIAGNOSTIC FINDINGS:  none   PATIENT SURVEYS:  FOTO 52% prediced 74%   SCREENING FOR RED FLAGS: Bowel or bladder incontinence: No Spinal tumors: No Cauda equina syndrome: No Compression fracture: No Abdominal aneurysm: No   COGNITION:          Overall cognitive status: Within functional limits for tasks assessed                        SENSATION:          Light touch: Appears intact          Stereognosis: Appears intact          Hot/Cold: Appears intact          Proprioception: Appears intact   MUSCLE LENGTH: Hamstrings: Right 75 deg; Left 60 deg pain in Left SI Thomas test: WNL   POSTURE:           Left SI more posterior than R anteriorly rotated. Slightly elevated R pelvic level. Left Sacral rotaion R pubic bone more superior and anterior 2-3/23 R SI more posterior    PALPATION: TTP on on  L SI and Left QL. Left SI [place] test L doesn't move , pain ful pubic symphysis   LUMBARAROM/PROM   A/PROM A/PROM  01/19/2022  Flexion Can touch ankle bil Pain on returning to upright  Extension WNL  Right lateral flexion 2 in finger tip below knee jt line P with returning to upright posture  Left lateral flexion 2.5 finger tip to below knee jt line  Right rotation WNL  Left rotation WNL   (Blank rows = not tested)   LE AROM/PROM:                               AROM WNL throughout LE A/PROM Right 01/19/2022 Left 01/19/2022  Hip flexion 123 117 pain on L SI  Hip extension      Hip abduction      Hip adduction      Hip internal rotation      Hip external rotation      Knee flexion      Knee extension      Ankle dorsiflexion      Ankle plantarflexion      Ankle inversion      Ankle eversion       (Blank rows = not tested)   LE MMT:                     Left pain in SI with any movement MMT Right 01/19/2022 Left 01/19/2022  Hip flexion 5/5 4/5 pain in Left SI   Hip extension 4+/5 4+/5  Hip abduction 4+/5 4/5  Hip adduction      Hip internal rotation      Hip external rotation      Knee flexion 5/5 5/5  Knee extension 5/5 5/5  Ankle dorsiflexion 5/5 5/5  Ankle plantarflexion 5/5 5/5  Ankle inversion      Ankle eversion       (Blank rows = not tested)   LUMBAR SPECIAL TESTS:  Straight leg raise test: Positive, Slump test: Negative, SI Compression/distraction test: Positive, and FABER test: Positive   FUNCTIONAL TESTS:  Squat pt unable to squat fully wt bears to R   GAIT: Distance walked: 150 Assistive device utilized: None Level of assistance: Complete Independence Comments: Pt walks slowly but normally to not exacerbate L SI pain       TODAY'S TREATMENT  [facility] Adult PT Treatment:                                                DATE: 02-09-22 Therapeutic Exercise:   Standing Hip Hinge with Dowel - x 15 to demo and then hip hinge against wall x 15 as external cue Goblet Squat with Kettlebell 2 x10 25# to box 1 x 10 Kettlebell Deadlift initial 15 # KB 2 x 5, 25# KB 2 x 8,  45 # KB 1 x 8, 65 lift bar 1 x 5 reps VC and TC for each lift Farmer's Carry with Kettlebells 2 x carrying R hadn 25 # KB and L hand 15 # KB Physioball crunch -roll up thighs x 20 Bridge with ankles on  red physio ballball 5 sec x 10  Heels on physioball- knee flexion with abdominal focus x 20 - (able to do 3 then  rest until 20 total due to fatigue Blue band hooklying clam Blue band bridge with clams during lift 3 rounds of 10 Alternating bent knee fall out with blue band  Bridge with ball squeeze-arms crossed x 10 Bird dog x 10- on airex  Sit-stand blue band  at knees 3 x 10 with 10# dumbbell at chest Piriformis stretch bilat Figure 4 stretch  bilat Double knee to chest   [facility] Adult PT Treatment:                                                DATE: 02-02-22  Hips aligned at start of session: no MET needed  Therapeutic Exercise:  Physioball crunch -roll up  thighs x 20 Bridge with ankles on ball 5 sec x 10  Heels on physioball- knee flexion with abdominal focus x 20 -  Blue band hooklying clam Blue band bridge with clams during lift 3 rounds of 10 Alternating bent knee fall out with blue band Bridge with ball squeeze-arms crossed x 10 Bird dog x 10- on airex  Sit-stand blue band  at knees 3 x 10 with 10# dumbbell at chest Piriformis stretch bilat Figure 4 stretch  bilat Double knee to chest      [facility] Adult PT Treatment:                                                DATE: 01-30-22  Manual therapy: MET 2 different options to correct pelvis -pt reported improvement in pain after MET.   Therapeutic Exercise:  Bridge with AROM clam 2 sets of 10 Blue band hooklying clam Blue band bridge with clams during lift 2 rounds of 10 Alternating bent knee fall out with blue band Bridge with ball squeeze-arms crossed x 10 Piriformis stretch bilat Figure 4 stretch  bilat Sit-stand x15 Sit-stand blue band  at knees x10, x 10 with 8# dumbbell Bird dog x 10-fatigues Physioball crunch -roll up thighs x 10, x 5  Double knee to chest    Discussed getting out of chair at desk with equal weight bearing. Tends to get up to the left.   [facility] Adult PT Treatment:                                                DATE: 01-26-22 Therapeutic Exercise:  Working on proper transfer on and off mat   Supine Double Knee to Chest 10 reps - 10  sechold Abdominal sets. 10 sec hold x 5 Pelvic tilt x 10 bent knee fall out 1 x 10 on R and L Supine Lower Trunk Rotation - 1-2 x daily - 7 x weekly - 1 sets - 5 reps - 20 sec hold hold Supine Piriformis Stretch Pulling Heel to Hip - 1-2 x daily - 7 x weekly - 1 sets - 2-3 reps - 30 sec hold Supine Bridge with Mini Swiss Ball Between Knees - 1 x daily - 7 x weekly - 3 sets - 10 reps Single Leg Bridge - 1 x daily - 7 x weekly - 3 sets - 10 reps Quadruped Rocking Slow - 1 x daily - 7 x weekly - 3 sets - 10 reps Quadruped  Forearm Plank with a [employer] 3 sets - 10 reps Quadruped  Hip Abduction with Resistance Loop - 1 x daily - 7 x weekly - 3 sets - 10 reps  Manual Therapy: MET  to  address Left SI pain,  Right sidelying lumbar sacral thrust with caviation STW to Left lumbosacral/Left QL   Modalities: Moist heat post TPDN   Trigger Point Dry Needling Treatment: Pre-treatment instruction: Patient instructed on dry needling rationale, procedures, and possible side effects including pain during treatment (achy,cramping feeling), bruising, drop of blood, lightheadedness, nausea, sweating. Patient Consent Given: Yes Education handout provided: Previously provided Muscles treated: Lumbar L 5/piriformis L  L QL Needle size and number: .30x 75 mm x 3 Electrical stimulation performed: No Parameters: N/A Treatment response/outcome: Twitch response elicited, Palpable decrease in muscle tension, and increased motion with less pain Post-treatment instructions: Patient instructed to expect possible mild to moderate muscle soreness later today and/or tomorrow. Patient instructed in methods to reduce muscle soreness and to continue prescribed HEP. If patient was dry needled over the lung field, patient was instructed on signs and symptoms of pneumothorax and, however unlikely, to see immediate medical attention should they occur. Patient was also educated on signs and symptoms of infection and to seek medical attention should they occur. Patient verbalized understanding of these instructions and education.   PATIENT EDUCATION:  Education details: POC  went over FOTO report, given HEP (basic Pre Pilates) updated HEP for strengthening and stretching Person educated: Patient Education method: Explanation, Demonstration, Tactile cues, Verbal cues, and Handouts Education comprehension: verbalized understanding, returned demonstration, verbal cues required, tactile cues required, and needs further education     HOME EXERCISE  PROGRAM: Pre pilates   Pelvic Tilt, abdominal bracing, Knee fold and Knee drop Access Code: [email or web address] URL: [email or web address] Date: 01/26/2022 Prepared by: Voncille Lo  Program Notes You can always do the pre pilates exercises given when you are tender.   Exercises Supine Double Knee to Chest - 1-2 x daily - 7 x weekly - 1 sets - 5-10 reps - 10 hold Supine Lower Trunk Rotation - 1-2 x daily - 7 x weekly - 1 sets - 5 reps Supine Piriformis Stretch Pulling Heel to Hip - 1-2 x daily - 7 x weekly - 1 sets - 23 reps - 30 sec hold Supine Bridge with Mini Swiss Ball Between Knees - 1 x daily - 7 x weekly - 3 sets - 10 reps Single Leg Bridge - 1 x daily - 7 x weekly - 3 sets - 10 reps Quadruped Rocking Slow - 1 x daily - 7 x weekly - 3 sets - 10 reps Quadruped Forearm Plank with a Mule Kick - 1 x daily - 7 x weekly - 3 sets - 10 reps Quadruped Hip Abduction with Resistance Loop - 1 x daily - 7 x weekly - 3 sets - 10 reps  Access Code: [email or web address] modified 02-09-22 URL: [email or web address] Date: 02/09/2022 Prepared by: Voncille Lo   Standing Hip Hinge with Dowel - 1 x daily - 7 x weekly - 10 reps - 3 sets Goblet Squat with Kettlebell - 1 x daily - 7 x weekly - 10 reps - 3 sets Kettlebell Deadlift - 1 x daily - 7 x weekly - 3 sets Farmer's Carry with Kettlebells - 1 x daily - 7 x weekly - 2 sets - 8 reps      ASSESSMENT:   CLINICAL IMPRESSION: Pt arrives without pain. Hip level at start of session. Pt is interested in learning how to lift and do some essential exercises for  the gym and to use equipment.  Pt was able to deadlift 65# x 5 today with effort.  Pt had 0/10 pain and Achieved all STG's and LTG #2 and #5.  Achieved.  Continued with core and hip stabilization.  Pt able to squat with #25 .  Ready to progress to advanced exercises.Patient will benefit from skilled PT to address above impairments and improve overall function.       GOALS: Goals reviewed with patient? Yes   SHORT TERM GOALS:   STG Name Target Date Goal status  1 Pt will be independent with initial HEP Baseline: need HEP 02/09/2022 ACHIEVED  2 Sit and stand with RT=LT wt bearing to reduce lumbar/sacral strain and allow for increased tolerance for these positions for work tasks Baseline: Wt bears to Right when standing 02-09-22 even wt bearing pt explains she is aware at work and at home getting off the chair 02/09/2022 ACHIEVED  3 Pt will be able to perform transfer movements without exacerbating pain Baseline:Pt increases pain with supine to sit and transfer out of car up to 9/10 pain 02-09-22 demo proper form 02/09/2022 ACHIEVED    LONG TERM GOALS:    LTG Name Target Date Goal status  1 Pt will be independent with advanced HEP in order to continue exercise with weights at a gym Baseline: Pt unable to exercise due to Left SI pain and bil LB pain 03/02/2022 INITIAL  2 Pt will not wake due to pain while turning in bed while sleeping at night Baseline: Pt able to sleep through the night without waking due to pain 03/02/2022 ACHIEVED  3 FOTO will improve from 52%   to 74%    indicating improved functional mobility.  Baseline: 03/02/2022 INITIAL  4 Pt will be able to lift 25 # from floor in order to handle heavy groceries at home Baseline: working on deadlift  able to do 65 # x 5 02-09-22 03/02/2022 ACHIEVED  5 Pt will be able to negotiate steps without exacerbating pain Baseline:Pt avoids steps due to L SI jt pain 03/02/2022 INITIAL  6 Pt will be able to perform household chores with proper body mechanics to decrease irritation of L SI and increase lumbar/sacral stability Baseline: 03/02/2022 INITIAL    PLAN: PT FREQUENCY: 2x/week   PT DURATION: 6 weeks   PLANNED INTERVENTIONS: Therapeutic exercises, Therapeutic activity, Neuro Muscular re-education, Balance training, Gait training, Patient/Family education, Joint mobilization, Stair training, Dry Needling,  Electrical stimulation, Cryotherapy, Moist heat, Taping, Ionotophoresis 76m/ml Dexamethasone, Manual therapy, and Lumbar/Sacral Stabilization   PLAN FOR NEXT SESSION: May use SI taping over Left SI, try resisted Hamstrings for reducing pain in left SI, bridge with ball squeez, review exercises. Progress HEP with weights as able       LVoncille Lo PT, ABrowntonCertified Exercise Expert for the Aging Adult  02/09/22 1:33 PM Phone: [phone]: [phone]

## 2022-02-09 NOTE — Therapy (Signed)
OUTPATIENT PHYSICAL THERAPY TREATMENT NOTE   Patient Name: Karen Burnett MRN: [id number] DOB:December 22, 1995, 26 y.o., female Today's Date: 02/14/2022  PCP: Rise Patience, DO REFERRING PROVIDER: Rise Patience, DO   PT End of Session - 02/14/22 1153     Visit Number 6    Number of Visits 12    Date for PT Re-Evaluation 03/02/22    Authorization Type [employer]    PT Start Time 1148    PT Stop Time 1233    PT Time Calculation (min) 45 min    Activity Tolerance Patient tolerated treatment well    Behavior During Therapy [facility] for tasks assessed/performed               Past Medical History:  Diagnosis Date   Anxiety    Ultrasound recheck of fetal pyelectasis, antepartum 05/24/2017   Vaginal Pap smear, abnormal 10/2016   ASCUS, HPV neg so needs routine screen   Past Surgical History:  Procedure Laterality Date   NO PAST SURGERIES     Patient Active Problem List   Diagnosis Date Noted   SI (sacroiliac) joint dysfunction 01/09/2022   Upper back pain 03/11/2021   Overweight 12/19/2018   Generalized anxiety disorder 01/02/2017   Family history of diabetes mellitus 10/01/2015   Nexplanon insertion 06/24/2015    REFERRING DIAG: M53.3 (ICD-10-CM) - SI (sacroiliac) joint dysfunction  THERAPY DIAG:  Acute right-sided low back pain with sciatica, sciatica laterality unspecified  Muscle weakness (generalized)  Difficulty in walking, not elsewhere classified  PERTINENT HISTORY: SI problems since high school, Nothing remarkable  PRECAUTIONS: None  SUBJECTIVE: I do not have pain today.  I went to the gym twice with my husband since I saw you.  PAIN:  Are you having pain? no NPRS scale: 0/10 at 5th visit  at worst 7/10  Pt with pulling but no pain Pain location: Left SI  Pain orientation: Other: R>L pubic pain,  L SI    PAIN TYPE: acute on chronic Pain description: intermittent, sharp, stabbing, and shooting   Aggravating factors: bending over, picking up kids and groceries,  sweeping and mopping going up and  down stairs Relieving factors: not moving, lying down    OBJECTIVE:    DIAGNOSTIC FINDINGS:  none   PATIENT SURVEYS:  FOTO 52% prediced 74%   SCREENING FOR RED FLAGS: Bowel or bladder incontinence: No Spinal tumors: No Cauda equina syndrome: No Compression fracture: No Abdominal aneurysm: No   COGNITION:          Overall cognitive status: Within functional limits for tasks assessed                        SENSATION:          Light touch: Appears intact          Stereognosis: Appears intact          Hot/Cold: Appears intact          Proprioception: Appears intact   MUSCLE LENGTH: Hamstrings: Right 75 deg; Left 60 deg pain in Left SI Thomas test: WNL   POSTURE:           Left SI more posterior than R anteriorly rotated. Slightly elevated R pelvic level. Left Sacral rotaion R pubic bone more superior and anterior 2-3/23 R SI more posterior    PALPATION: TTP on on  L SI and Left QL. Left SI [place] test L doesn't move , pain ful pubic symphysis   LUMBARAROM/PROM  A/PROM A/PROM  01/19/2022  Flexion Can touch ankle bil Pain on returning to upright  Extension WNL  Right lateral flexion 2 in finger tip below knee jt line P with returning to upright posture  Left lateral flexion 2.5 finger tip to below knee jt line  Right rotation WNL  Left rotation WNL   (Blank rows = not tested)   LE AROM/PROM:                               AROM WNL throughout LE A/PROM Right 01/19/2022 Left 01/19/2022  Hip flexion 123 117 pain on L SI  Hip extension      Hip abduction      Hip adduction      Hip internal rotation      Hip external rotation      Knee flexion      Knee extension      Ankle dorsiflexion      Ankle plantarflexion      Ankle inversion      Ankle eversion       (Blank rows = not tested)   LE MMT:                     Left pain in SI with any movement MMT Right 01/19/2022 Left 01/19/2022  Hip flexion 5/5 4/5 pain in Left SI   Hip extension 4+/5 4+/5  Hip abduction 4+/5 4/5  Hip adduction      Hip internal rotation      Hip external rotation      Knee flexion 5/5 5/5  Knee extension 5/5 5/5  Ankle dorsiflexion 5/5 5/5  Ankle plantarflexion 5/5 5/5  Ankle inversion      Ankle eversion       (Blank rows = not tested)   LUMBAR SPECIAL TESTS:  Straight leg raise test: Positive, Slump test: Negative, SI Compression/distraction test: Positive, and FABER test: Positive   FUNCTIONAL TESTS:  Squat pt unable to squat fully wt bears to R   GAIT: Distance walked: 150 Assistive device utilized: None Level of assistance: Complete Independence Comments: Pt walks slowly but normally to not exacerbate L SI pain       TODAY'S TREATMENT   [facility] Adult PT Treatment:                                                DATE: 02/14/22 Therapeutic Exercise:  Elliptical 8 minutes RPE 5/6 AMRAP ( as many reps as possible ) 3 rounds       Goblet squat 25 # Round #1 26 reps, round #2 27 reps        Lat pull blue t band Round #1 R 10, L 10;Round#2 R 13reps L13reps        Reverse lunge 10 # Round #1 R 15 L 14 Round#2 R 11, L 10        Cybex hip ext  # 25 #  Round #1 R 12 L14 Round#2  R 15, L 15 reps    [facility] Adult PT Treatment:  DATE: 02-09-22 Therapeutic Exercise:   Standing Hip Hinge with Dowel - x 15 to demo and then hip hinge against wall x 15 as external cue Goblet Squat with Kettlebell 2 x10 25# to box 1 x 10 Kettlebell Deadlift initial 15 # KB 2 x 5, 25# KB 2 x 8,  45 # KB 1 x 8, 65 lift bar 1 x 5 reps VC and TC for each lift Farmer's Carry with Kettlebells 2 x carrying R hadn 25 # KB and L hand 15 # KB Physioball crunch -roll up thighs x 20 Bridge with ankles on  red physio ballball 5 sec x 10  Heels on physioball- knee flexion with abdominal focus x 20 - (able to do 3 then  rest until 20 total due to fatigue Blue band hooklying clam Blue band bridge with clams during  lift 3 rounds of 10 Alternating bent knee fall out with blue band Bridge with ball squeeze-arms crossed x 10 Bird dog x 10- on airex  Sit-stand blue band  at knees 3 x 10 with 10# dumbbell at chest Piriformis stretch bilat Figure 4 stretch  bilat Double knee to chest   [facility] Adult PT Treatment:                                                DATE: 02-02-22  Hips aligned at start of session: no MET needed  Therapeutic Exercise:  Physioball crunch -roll up thighs x 20 Bridge with ankles on ball 5 sec x 10  Heels on physioball- knee flexion with abdominal focus x 20 -  Blue band hooklying clam Blue band bridge with clams during lift 3 rounds of 10 Alternating bent knee fall out with blue band Bridge with ball squeeze-arms crossed x 10 Bird dog x 10- on airex  Sit-stand blue band  at knees 3 x 10 with 10# dumbbell at chest Piriformis stretch bilat Figure 4 stretch  bilat Double knee to chest      [facility] Adult PT Treatment:                                                DATE: 01-30-22  Manual therapy: MET 2 different options to correct pelvis -pt reported improvement in pain after MET.   Therapeutic Exercise:  Bridge with AROM clam 2 sets of 10 Blue band hooklying clam Blue band bridge with clams during lift 2 rounds of 10 Alternating bent knee fall out with blue band Bridge with ball squeeze-arms crossed x 10 Piriformis stretch bilat Figure 4 stretch  bilat Sit-stand x15 Sit-stand blue band  at knees x10, x 10 with 8# dumbbell Bird dog x 10-fatigues Physioball crunch -roll up thighs x 10, x 5  Double knee to chest    Discussed getting out of chair at desk with equal weight bearing. Tends to get up to the left.        PATIENT EDUCATION:  Education details: POC  went over FOTO report, given HEP (basic Pre Pilates) updated HEP for strengthening and stretching, added AMRAP and EMOM to HEP Person educated: Patient Education method: Explanation, Demonstration, Tactile cues,  Verbal cues, and Handouts Education comprehension: verbalized understanding, returned demonstration, verbal cues required, tactile cues required,  and needs further education     HOME EXERCISE PROGRAM: Pre pilates   Pelvic Tilt, abdominal bracing, Knee fold and Knee drop Access Code: [email or web address] URL: [email or web address] Date: 01/26/2022 Prepared by: Voncille Lo  Program Notes You can always do the pre pilates exercises given when you are tender.   Exercises Supine Double Knee to Chest - 1-2 x daily - 7 x weekly - 1 sets - 5-10 reps - 10 hold Supine Lower Trunk Rotation - 1-2 x daily - 7 x weekly - 1 sets - 5 reps Supine Piriformis Stretch Pulling Heel to Hip - 1-2 x daily - 7 x weekly - 1 sets - 23 reps - 30 sec hold Supine Bridge with Mini Swiss Ball Between Knees - 1 x daily - 7 x weekly - 3 sets - 10 reps Single Leg Bridge - 1 x daily - 7 x weekly - 3 sets - 10 reps Quadruped Rocking Slow - 1 x daily - 7 x weekly - 3 sets - 10 reps Quadruped Forearm Plank with a Mule Kick - 1 x daily - 7 x weekly - 3 sets - 10 reps Quadruped Hip Abduction with Resistance Loop - 1 x daily - 7 x weekly - 3 sets - 10 reps  Access Code: [email or web address] modified 02-09-22 URL: [email or web address] Date: 02/09/2022 Prepared by: Voncille Lo   Standing Hip Hinge with Dowel - 1 x daily - 7 x weekly - 10 reps - 3 sets Goblet Squat with Kettlebell - 1 x daily - 7 x weekly - 10 reps - 3 sets Kettlebell Deadlift - 1 x daily - 7 x weekly - 3 sets Farmer's Carry with Kettlebells - 1 x daily - 7 x weekly - 2 sets - 8 reps      ASSESSMENT:   CLINICAL IMPRESSION: Pt arrives without pain. Hip level at start of session even. Pt is interested in learning how to lift and do some essential exercises for the gym and to use equipment. Ms Esquivias was introduced to [facility], AMRAP to build endurance this session.  Pt will be ready for progressive overload in future sessions to prepare for  gym. Ready to progress to advanced exercises.Patient will benefit from skilled PT to address above impairments and improve overall function and decrease pain in back/SI      GOALS: Goals reviewed with patient? Yes   SHORT TERM GOALS:   STG Name Target Date Goal status  1 Pt will be independent with initial HEP Baseline: need HEP 02/09/2022 ACHIEVED  2 Sit and stand with RT=LT wt bearing to reduce lumbar/sacral strain and allow for increased tolerance for these positions for work tasks Baseline: Wt bears to Right when standing 02-09-22 even wt bearing pt explains she is aware at work and at home getting off the chair 02/09/2022 ACHIEVED  3 Pt will be able to perform transfer movements without exacerbating pain Baseline:Pt increases pain with supine to sit and transfer out of car up to 9/10 pain 02-09-22 demo proper form 02/09/2022 ACHIEVED    LONG TERM GOALS:    LTG Name Target Date Goal status  1 Pt will be independent with advanced HEP in order to continue exercise with weights at a gym Baseline: Pt unable to exercise due to Left SI pain and bil LB pain 03/02/2022 INITIAL  2 Pt will not wake due to pain while turning in bed while sleeping at night Baseline: Pt able to sleep through the night without waking due to pain 03/02/2022 ACHIEVED  3 FOTO will improve from 52%   to 74%    indicating improved functional mobility.  Baseline: 03/02/2022 INITIAL  4 Pt will be able to lift 25 # from floor in order to handle heavy groceries at home Baseline: working on deadlift  able to do 65 # x 5 02-09-22 03/02/2022 ACHIEVED  5 Pt will be able to negotiate steps without exacerbating pain Baseline:Pt avoids steps due to L SI jt pain 02-14-22 no pain alternating legs 03/02/2022 ACHIEVED  6 Pt will be able to perform household chores with proper body mechanics to decrease irritation of L SI and increase lumbar/sacral stability Baseline: 03/02/2022 INITIAL    PLAN: PT FREQUENCY: 2x/week   PT DURATION: 6  weeks   PLANNED INTERVENTIONS: Therapeutic exercises, Therapeutic activity, Neuro Muscular re-education, Balance training, Gait training, Patient/Family education, Joint mobilization, Stair training, Dry Needling, Electrical stimulation, Cryotherapy, Moist heat, Taping, Ionotophoresis 34m/ml Dexamethasone, Manual therapy, and Lumbar/Sacral Stabilization   PLAN FOR NEXT SESSION:  hamstring on physio ball, thruster with weight on edge of mat, review  lift bar / back squat      LVoncille Lo PT, ANew Hyde ParkCertified Exercise Expert for the Aging Adult  02/14/22 12:41 PM Phone: [phone]: [phone]

## 2022-02-14 ENCOUNTER — Encounter: Payer: Self-pay | Admitting: Physical Therapy

## 2022-02-14 ENCOUNTER — Ambulatory Visit: Payer: 59 | Admitting: Physical Therapy

## 2022-02-14 ENCOUNTER — Other Ambulatory Visit: Payer: Self-pay

## 2022-02-14 DIAGNOSIS — R262 Difficulty in walking, not elsewhere classified: Secondary | ICD-10-CM | POA: Diagnosis not present

## 2022-02-14 DIAGNOSIS — M533 Sacrococcygeal disorders, not elsewhere classified: Secondary | ICD-10-CM | POA: Diagnosis not present

## 2022-02-14 DIAGNOSIS — M5440 Lumbago with sciatica, unspecified side: Secondary | ICD-10-CM | POA: Diagnosis not present

## 2022-02-14 DIAGNOSIS — M6281 Muscle weakness (generalized): Secondary | ICD-10-CM

## 2022-02-14 NOTE — Patient Instructions (Signed)
AMRAP ( as many reps as possible ) 3 rounds  what you did for treatment can be used at the gym        Goblet squat 25 # Round #1 26 reps, round #2 27 reps round #3 25  Lat pull blue t band Round #1 R 10, L 10;Round#2 R 13reps L13reps round #3 R 14 , L 14 Reverse lunge 10 # Round #1 R 15 L 14 Round#2 R 11, L 10  Round #3 R 10 L 10 reps Cybex hip ext  # 25 #  Round #1 R 12 L14 Round#2  R 15, L 15 reps round #3  R 14  , L  15    Another idea for EMOM  EMOM (every minute on the minute) 15 minutes 4 rounds Min 1 KB sit to stand goblet squat x 10 with 30# Min 2 Farmer's carry with 25 # forward step up 10 on R and 10 on L Min 3 Forward lunge without weights and Upper extremity support Min Table push up x 8 or Overhead H(overhead)DB press up  Garen Lah, PT, ATR[facility] Certified Exercise Expert for the Aging Adult  02/14/22 12:20 PM Phone: [phone] Fax: [phone]

## 2022-02-15 NOTE — Therapy (Signed)
OUTPATIENT PHYSICAL THERAPY TREATMENT NOTE   Patient Name: Karen Burnett MRN: [id number] DOB:[birth year], 26 y.o., female Today's Date: 02/16/2022  PCP: Rise Patience, DO REFERRING PROVIDER: Rise Patience, DO   PT End of Session - 02/16/22 1158     Visit Number 7    Number of Visits 12    Date for PT Re-Evaluation 03/02/22    Authorization Type [employer]    PT Start Time 1157    PT Stop Time 1240    PT Time Calculation (min) 43 min    Activity Tolerance Patient tolerated treatment well    Behavior During Therapy WFL for tasks assessed/performed                Past Medical History:  Diagnosis Date   Anxiety    Ultrasound recheck of fetal pyelectasis, antepartum 05/24/2017   Vaginal Pap smear, abnormal 10/2016   ASCUS, HPV neg so needs routine screen   Past Surgical History:  Procedure Laterality Date   NO PAST SURGERIES     Patient Active Problem List   Diagnosis Date Noted   SI (sacroiliac) joint dysfunction 01/09/2022   Upper back pain 03/11/2021   Overweight 12/19/2018   Generalized anxiety disorder 01/02/2017   Family history of diabetes mellitus 10/01/2015   Nexplanon insertion 06/24/2015    REFERRING DIAG: M53.3 (ICD-10-CM) - SI (sacroiliac) joint dysfunction  THERAPY DIAG:  Acute right-sided low back pain with sciatica, sciatica laterality unspecified  Muscle weakness (generalized)  Difficulty in walking, not elsewhere classified  PERTINENT HISTORY: SI problems since high school, Nothing remarkable  PRECAUTIONS: None  SUBJECTIVE:I was so sore I skipped th gym yesterday.  I dont have any SI pain but I have muscle soreness  PAIN:  Are you having pain? no NPRS scale: 0/10 at 5th visit  at worst 7/10  Pt with pulling but no pain Pain location: Left SI  Pain orientation: Other: R>L pubic pain,  L SI    PAIN TYPE: acute on chronic Pain description: intermittent, sharp, stabbing, and shooting   Aggravating factors: bending over, picking up kids and  groceries, sweeping and mopping going up and  down stairs Relieving factors: not moving, lying down    OBJECTIVE:    DIAGNOSTIC FINDINGS:  none   PATIENT SURVEYS:  FOTO 52% prediced 74%   SCREENING FOR RED FLAGS: Bowel or bladder incontinence: No Spinal tumors: No Cauda equina syndrome: No Compression fracture: No Abdominal aneurysm: No   COGNITION:          Overall cognitive status: Within functional limits for tasks assessed                        SENSATION:          Light touch: Appears intact          Stereognosis: Appears intact          Hot/Cold: Appears intact          Proprioception: Appears intact   MUSCLE LENGTH: Hamstrings: Right 75 deg; Left 60 deg pain in Left SI Thomas test: WNL   POSTURE:           Left SI more posterior than R anteriorly rotated. Slightly elevated R pelvic level. Left Sacral rotaion R pubic bone more superior and anterior 2-3/23 R SI more posterior    PALPATION: TTP on on  L SI and Left QL. Left SI [place] test L doesn't move , pain ful pubic symphysis   LUMBARAROM/PROM  A/PROM A/PROM  01/19/2022  Flexion Can touch ankle bil Pain on returning to upright  Extension WNL  Right lateral flexion 2 in finger tip below knee jt line P with returning to upright posture  Left lateral flexion 2.5 finger tip to below knee jt line  Right rotation WNL  Left rotation WNL   (Blank rows = not tested)   LE AROM/PROM:                               AROM WNL throughout LE A/PROM Right 01/19/2022 Left 01/19/2022  Hip flexion 123 117 pain on L SI  Hip extension      Hip abduction      Hip adduction      Hip internal rotation      Hip external rotation      Knee flexion      Knee extension      Ankle dorsiflexion      Ankle plantarflexion      Ankle inversion      Ankle eversion       (Blank rows = not tested)   LE MMT:                     Left pain in SI with any movement MMT Right 01/19/2022 Left 01/19/2022  Hip flexion 5/5 4/5 pain in  Left SI  Hip extension 4+/5 4+/5  Hip abduction 4+/5 4/5  Hip adduction      Hip internal rotation      Hip external rotation      Knee flexion 5/5 5/5  Knee extension 5/5 5/5  Ankle dorsiflexion 5/5 5/5  Ankle plantarflexion 5/5 5/5  Ankle inversion      Ankle eversion       (Blank rows = not tested)   LUMBAR SPECIAL TESTS:  Straight leg raise test: Positive, Slump test: Negative, SI Compression/distraction test: Positive, and FABER test: Positive   FUNCTIONAL TESTS:  Squat pt unable to squat fully wt bears to R   GAIT: Distance walked: 150 Assistive device utilized: None Level of assistance: Complete Independence Comments: Pt walks slowly but normally to not exacerbate L SI pain       TODAY'S TREATMENT   [facility] Adult PT Treatment:                                                DATE: 02-16-22 Therapeutic Exercise:  Elliptical 8 minutes RPE 5/6 Banded SLS clams with BTB 3 x 10 Childs pose forward and side to side 30 sec x 4 SL bridge off edge of mat with VC for head position 2 x 10 R and L with rest between sets Standing quad set 30 sec x 2 R and L each Physioball crunch -roll up thighs x 20 Side plank   from knees on L  3 x 30 sec and then on R 3 x 30sec  weaker on L Front plank  on elbow 3 x 30 sec Goblet squat 30 # 1 x 8  then did 2 more Bird dog x 10 each side - on airex  Piriformis stretch bilat Figure 4 stretch  bilat    [facility] Adult PT Treatment:  DATE: 02/14/22 Therapeutic Exercise:  Elliptical 8 minutes RPE 5/6 AMRAP ( as many reps as possible ) 3 rounds       Goblet squat 25 # Round #1 26 reps, round #2 27 reps        Lat pull blue t band Round #1 R 10, L 10;Round#2 R 13reps L13reps        Reverse lunge 10 # Round #1 R 15 L 14 Round#2 R 11, L 10        Cybex hip ext  # 25 #  Round #1 R 12 L14 Round#2  R 15, L 15 reps    [facility] Adult PT Treatment:                                                DATE:  02-09-22 Therapeutic Exercise:   Standing Hip Hinge with Dowel - x 15 to demo and then hip hinge against wall x 15 as external cue Goblet Squat with Kettlebell 2 x10 25# to box 1 x 10 Kettlebell Deadlift initial 15 # KB 2 x 5, 25# KB 2 x 8,  45 # KB 1 x 8, 65 lift bar 1 x 5 reps VC and TC for each lift Farmer's Carry with Kettlebells 2 x carrying R hadn 25 # KB and L hand 15 # KB Physioball crunch -roll up thighs x 20 Bridge with ankles on  red physio ballball 5 sec x 10  Heels on physioball- knee flexion with abdominal focus x 20 - (able to do 3 then  rest until 20 total due to fatigue Blue band hooklying clam Blue band bridge with clams during lift 3 rounds of 10 Alternating bent knee fall out with blue band Bridge with ball squeeze-arms crossed x 10 Bird dog x 10- on airex  Sit-stand blue band  at knees 3 x 10 with 10# dumbbell at chest Piriformis stretch bilat Figure 4 stretch  bilat Double knee to chest   [facility] Adult PT Treatment:                                                DATE: 02-02-22  Hips aligned at start of session: no MET needed  Therapeutic Exercise:  Physioball crunch -roll up thighs x 20 Bridge with ankles on ball 5 sec x 10  Heels on physioball- knee flexion with abdominal focus x 20 -  Blue band hooklying clam Blue band bridge with clams during lift 3 rounds of 10 Alternating bent knee fall out with blue band Bridge with ball squeeze-arms crossed x 10 Bird dog x 10- on airex  Sit-stand blue band  at knees 3 x 10 with 10# dumbbell at chest Piriformis stretch bilat Figure 4 stretch  bilat Double knee to chest            PATIENT EDUCATION:  Education details: POC  went over FOTO report, given HEP (basic Pre Pilates) updated HEP for strengthening and stretching, added AMRAP and EMOM to HEP Person educated: Patient Education method: Explanation, Demonstration, Tactile cues, Verbal cues, and Handouts Education comprehension: verbalized understanding,  returned demonstration, verbal cues required, tactile cues required, and needs further education     HOME EXERCISE PROGRAM: Pre pilates  Pelvic Tilt, abdominal bracing, Knee fold and Knee drop Access Code: [email or web address] URL: [email or web address] Date: 01/26/2022 Prepared by: Voncille Lo  Program Notes You can always do the pre pilates exercises given when you are tender.   Exercises Supine Double Knee to Chest - 1-2 x daily - 7 x weekly - 1 sets - 5-10 reps - 10 hold Supine Lower Trunk Rotation - 1-2 x daily - 7 x weekly - 1 sets - 5 reps Supine Piriformis Stretch Pulling Heel to Hip - 1-2 x daily - 7 x weekly - 1 sets - 23 reps - 30 sec hold Supine Bridge with Mini Swiss Ball Between Knees - 1 x daily - 7 x weekly - 3 sets - 10 reps Single Leg Bridge - 1 x daily - 7 x weekly - 3 sets - 10 reps Quadruped Rocking Slow - 1 x daily - 7 x weekly - 3 sets - 10 reps Quadruped Forearm Plank with a Mule Kick - 1 x daily - 7 x weekly - 3 sets - 10 reps Quadruped Hip Abduction with Resistance Loop - 1 x daily - 7 x weekly - 3 sets - 10 reps  Access Code: [email or web address] modified 02-09-22 URL: [email or web address] Date: 02/09/2022 Prepared by: Voncille Lo   Standing Hip Hinge with Dowel - 1 x daily - 7 x weekly - 10 reps - 3 sets Goblet Squat with Kettlebell - 1 x daily - 7 x weekly - 10 reps - 3 sets Kettlebell Deadlift - 1 x daily - 7 x weekly - 3 sets Farmer's Carry with Kettlebells - 1 x daily - 7 x weekly - 2 sets - 8 reps      ASSESSMENT:   CLINICAL IMPRESSION: Pt continues with even pelvic levels and  0/10 pain in SI jt.  Pt has been strengthening with more challenging exercises and progressive overload.  Ms Malphrus does report soreness in anterior thighs since exercises have been quad dominant.  Worked on posterior chain exercises. Pt will be ready for progressive overload in future sessions to prepare for gym. Ready to progress to advanced  exercises.Patient will benefit from skilled PT to address above impairments and improve overall function and decrease pain in back/SI      GOALS: Goals reviewed with patient? Yes   SHORT TERM GOALS:   STG Name Target Date Goal status  1 Pt will be independent with initial HEP Baseline: need HEP 02/09/2022 ACHIEVED  2 Sit and stand with RT=LT wt bearing to reduce lumbar/sacral strain and allow for increased tolerance for these positions for work tasks Baseline: Wt bears to Right when standing 02-09-22 even wt bearing pt explains she is aware at work and at home getting off the chair 02/09/2022 ACHIEVED  3 Pt will be able to perform transfer movements without exacerbating pain Baseline:Pt increases pain with supine to sit and transfer out of car up to 9/10 pain 02-09-22 demo proper form 02/09/2022 ACHIEVED    LONG TERM GOALS:    LTG Name Target Date Goal status  1 Pt will be independent with advanced HEP in order to continue exercise with weights at a gym Baseline: Pt unable to exercise due to Left SI pain and bil LB pain 03/02/2022 INITIAL  2 Pt will not wake due to pain while turning in bed while sleeping at night Baseline: Pt able to sleep through the night without waking due to pain 03/02/2022 ACHIEVED  3 FOTO will improve from 52%   to 74%  indicating improved functional mobility.  Baseline: 03/02/2022 INITIAL  4 Pt will be able to lift 25 # from floor in order to handle heavy groceries at home Baseline: working on deadlift  able to do 65 # x 5 02-09-22 03/02/2022 ACHIEVED  5 Pt will be able to negotiate steps without exacerbating pain Baseline:Pt avoids steps due to L SI jt pain 02-14-22 no pain alternating legs 03/02/2022 ACHIEVED  6 Pt will be able to perform household chores with proper body mechanics to decrease irritation of L SI and increase lumbar/sacral stability Baseline: 03/02/2022 INITIAL    PLAN: PT FREQUENCY: 2x/week   PT DURATION: 6 weeks   PLANNED INTERVENTIONS:  Therapeutic exercises, Therapeutic activity, Neuro Muscular re-education, Balance training, Gait training, Patient/Family education, Joint mobilization, Stair training, Dry Needling, Electrical stimulation, Cryotherapy, Moist heat, Taping, Ionotophoresis 22m/ml Dexamethasone, Manual therapy, and Lumbar/Sacral Stabilization   PLAN FOR NEXT SESSION:  hamstring on physio ball, thruster with weight on edge of mat, review  lift bar / back squat      LVoncille Lo PT, AAtlantaCertified Exercise Expert for the Aging Adult  02/16/22 1:11 PM Phone: [phone]: [phone]

## 2022-02-16 ENCOUNTER — Other Ambulatory Visit: Payer: Self-pay

## 2022-02-16 ENCOUNTER — Encounter: Payer: Self-pay | Admitting: Physical Therapy

## 2022-02-16 ENCOUNTER — Ambulatory Visit: Payer: 59 | Attending: Family Medicine | Admitting: Physical Therapy

## 2022-02-16 DIAGNOSIS — R262 Difficulty in walking, not elsewhere classified: Secondary | ICD-10-CM | POA: Diagnosis not present

## 2022-02-16 DIAGNOSIS — M6281 Muscle weakness (generalized): Secondary | ICD-10-CM | POA: Diagnosis not present

## 2022-02-16 DIAGNOSIS — M5440 Lumbago with sciatica, unspecified side: Secondary | ICD-10-CM | POA: Insufficient documentation

## 2022-02-16 NOTE — Therapy (Signed)
OUTPATIENT PHYSICAL THERAPY TREATMENT NOTE   Patient Name: Karen Burnett MRN: [id number] DOB:25-Mar-1996, 26 y.o., female Today's Date: 02/21/2022  PCP: Rise Patience, DO REFERRING PROVIDER: Rise Patience, DO   PT End of Session - 02/21/22 1158     Visit Number 8    Number of Visits 12    Date for PT Re-Evaluation 03/02/22    Authorization Type [employer]    PT Start Time 1148    PT Stop Time 1230    PT Time Calculation (min) 42 min    Activity Tolerance Patient tolerated treatment well    Behavior During Therapy WFL for tasks assessed/performed                 Past Medical History:  Diagnosis Date   Anxiety    Ultrasound recheck of fetal pyelectasis, antepartum 05/24/2017   Vaginal Pap smear, abnormal 10/2016   ASCUS, HPV neg so needs routine screen   Past Surgical History:  Procedure Laterality Date   NO PAST SURGERIES     Patient Active Problem List   Diagnosis Date Noted   SI (sacroiliac) joint dysfunction 01/09/2022   Upper back pain 03/11/2021   Overweight 12/19/2018   Generalized anxiety disorder 01/02/2017   Family history of diabetes mellitus 10/01/2015   Nexplanon insertion 06/24/2015    REFERRING DIAG: M53.3 (ICD-10-CM) - SI (sacroiliac) joint dysfunction  THERAPY DIAG:  Acute right-sided low back pain with sciatica, sciatica laterality unspecified  Muscle weakness (generalized)  Difficulty in walking, not elsewhere classified  PERTINENT HISTORY: SI problems since high school, Nothing remarkable  PRECAUTIONS: None  SUBJECTIVE:I have been doing better at the gym. I sometimes feel pull on my R SI with doing cleaning out of the refrigerator and making beds but no real pain.   PAIN:  Are you having pain? no NPRS scale: 0/10 at 5th visit  at worst 7/10  Pt with pulling but no pain Pain location: Left SI  Pain orientation: Other: R>L pubic pain,  L SI    PAIN TYPE: acute on chronic Pain description: intermittent, sharp, stabbing, and shooting    Aggravating factors: bending over, picking up kids and groceries, sweeping and mopping going up and  down stairs Relieving factors: not moving, lying down    OBJECTIVE:    DIAGNOSTIC FINDINGS:  none   PATIENT SURVEYS:  FOTO 52% predicted 74% 02-21-22 94% Goal achieved   SCREENING FOR RED FLAGS: Bowel or bladder incontinence: No Spinal tumors: No Cauda equina syndrome: No Compression fracture: No Abdominal aneurysm: No   COGNITION:          Overall cognitive status: Within functional limits for tasks assessed                        SENSATION:          Light touch: Appears intact          Stereognosis: Appears intact          Hot/Cold: Appears intact          Proprioception: Appears intact   MUSCLE LENGTH: Hamstrings: Right 75 deg; Left 60 deg pain in Left SI Thomas test: WNL   POSTURE:           Left SI more posterior than R anteriorly rotated. Slightly elevated R pelvic level. Left Sacral rotaion R pubic bone more superior and anterior 2-3/23 R SI more posterior    PALPATION: TTP on on  L SI and Left QL.  Left SI [place] test L doesn't move , pain ful pubic symphysis   LUMBARAROM/PROM   A/PROM A/PROM  01/19/2022  Flexion Can touch ankle bil Pain on returning to upright  Extension WNL  Right lateral flexion 2 in finger tip below knee jt line P with returning to upright posture  Left lateral flexion 2.5 finger tip to below knee jt line  Right rotation WNL  Left rotation WNL   (Blank rows = not tested)   LE AROM/PROM:                               AROM WNL throughout LE A/PROM Right 01/19/2022 Left 01/19/2022  Hip flexion 123 117 pain on L SI  Hip extension      Hip abduction      Hip adduction      Hip internal rotation      Hip external rotation      Knee flexion      Knee extension      Ankle dorsiflexion      Ankle plantarflexion      Ankle inversion      Ankle eversion       (Blank rows = not tested)   LE MMT:                     Left pain in SI  with any movement MMT Right 01/19/2022 Left 01/19/2022  Hip flexion 5/5 4/5 pain in Left SI  Hip extension 4+/5 4+/5  Hip abduction 4+/5 4/5  Hip adduction      Hip internal rotation      Hip external rotation      Knee flexion 5/5 5/5  Knee extension 5/5 5/5  Ankle dorsiflexion 5/5 5/5  Ankle plantarflexion 5/5 5/5  Ankle inversion      Ankle eversion       (Blank rows = not tested)   LUMBAR SPECIAL TESTS:  Straight leg raise test: Positive, Slump test: Negative, SI Compression/distraction test: Positive, and FABER test: Positive   FUNCTIONAL TESTS:  Squat pt unable to squat fully wt bears to R   GAIT: Distance walked: 150 Assistive device utilized: None Level of assistance: Complete Independence Comments: Pt walks slowly but normally to not exacerbate L SI pain       TODAY'S TREATMENT  [facility] Adult PT Treatment:                                                DATE: 02-21-22 Therapeutic Exercise: Elliptical 8 minutes 20 sec fast and then 40 sec Back squat with lift bar 45#  x 5 then with 75# x 6,  , then with 85#  5 x 2 Leg press cybex 120# 3 x 10 Cybex  hip extension with 50 lb 2 x 10, R and L Cybex hip abduction knee/hip flex 80 degrees 2 x 10 R and L  Box squat  14 inch from ground 45#  DB  3 x 10  [facility] Adult PT Treatment:                                                DATE:  02-16-22 Therapeutic Exercise:  Elliptical 8 minutes RPE 5/6 Banded SLS clams with BTB 3 x 10 Childs pose forward and side to side 30 sec x 4 SL bridge off edge of mat with VC for head position 2 x 10 R and L with rest between sets Standing quad set 30 sec x 2 R and L each Physioball crunch -roll up thighs x 20 Side plank   from knees on L  3 x 30 sec and then on R 3 x 30sec  weaker on L Front plank  on elbow 3 x 30 sec Goblet squat 30 # 1 x 8  then did 2 more Bird dog x 10 each side - on airex  Piriformis stretch bilat Figure 4 stretch  bilat    [facility] Adult PT Treatment:                                                 DATE: 02/14/22 Therapeutic Exercise:  Elliptical 8 minutes RPE 5/6 AMRAP ( as many reps as possible ) 3 rounds       Goblet squat 25 # Round #1 26 reps, round #2 27 reps        Lat pull blue t band Round #1 R 10, L 10;Round#2 R 13reps L13reps        Reverse lunge 10 # Round #1 R 15 L 14 Round#2 R 11, L 10        Cybex hip ext  # 25 #  Round #1 R 12 L14 Round#2  R 15, L 15 reps    [facility] Adult PT Treatment:                                                DATE: 02-09-22 Therapeutic Exercise:   Standing Hip Hinge with Dowel - x 15 to demo and then hip hinge against wall x 15 as external cue Goblet Squat with Kettlebell 2 x10 25# to box 1 x 10 Kettlebell Deadlift initial 15 # KB 2 x 5, 25# KB 2 x 8,  45 # KB 1 x 8, 65 lift bar 1 x 5 reps VC and TC for each lift Farmer's Carry with Kettlebells 2 x carrying R hadn 25 # KB and L hand 15 # KB Physioball crunch -roll up thighs x 20 Bridge with ankles on  red physio ballball 5 sec x 10  Heels on physioball- knee flexion with abdominal focus x 20 - (able to do 3 then  rest until 20 total due to fatigue Blue band hooklying clam Blue band bridge with clams during lift 3 rounds of 10 Alternating bent knee fall out with blue band Bridge with ball squeeze-arms crossed x 10 Bird dog x 10- on airex  Sit-stand blue band  at knees 3 x 10 with 10# dumbbell at chest Piriformis stretch bilat Figure 4 stretch  bilat Double knee to chest   [facility] Adult PT Treatment:                                                DATE: 02-02-22  Hips aligned at start of session: no MET needed  Therapeutic Exercise:  Physioball crunch -roll up thighs x 20 Bridge with ankles on ball 5 sec x 10  Heels on physioball- knee flexion with abdominal focus x 20 -  Blue band hooklying clam Blue band bridge with clams during lift 3 rounds of 10 Alternating bent knee fall out with blue band Bridge with ball squeeze-arms crossed x 10 Bird dog x 10- on airex   Sit-stand blue band  at knees 3 x 10 with 10# dumbbell at chest Piriformis stretch bilat Figure 4 stretch  bilat Double knee to chest            PATIENT EDUCATION:  Education details: POC  went over FOTO report, given HEP (basic Pre Pilates) updated HEP for strengthening and stretching, added AMRAP and EMOM to HEP Person educated: Patient Education method: Explanation, Demonstration, Tactile cues, Verbal cues, and Handouts Education comprehension: verbalized understanding, returned demonstration, verbal cues required, tactile cues required, and needs further education     HOME EXERCISE PROGRAM: Pre pilates   Pelvic Tilt, abdominal bracing, Knee fold and Knee drop Access Code: [email or web address] URL: [email or web address] Date: 01/26/2022 Prepared by: Voncille Lo  Program Notes You can always do the pre pilates exercises given when you are tender.   Exercises Supine Double Knee to Chest - 1-2 x daily - 7 x weekly - 1 sets - 5-10 reps - 10 hold Supine Lower Trunk Rotation - 1-2 x daily - 7 x weekly - 1 sets - 5 reps Supine Piriformis Stretch Pulling Heel to Hip - 1-2 x daily - 7 x weekly - 1 sets - 23 reps - 30 sec hold Supine Bridge with Mini Swiss Ball Between Knees - 1 x daily - 7 x weekly - 3 sets - 10 reps Single Leg Bridge - 1 x daily - 7 x weekly - 3 sets - 10 reps Quadruped Rocking Slow - 1 x daily - 7 x weekly - 3 sets - 10 reps Quadruped Forearm Plank with a Mule Kick - 1 x daily - 7 x weekly - 3 sets - 10 reps Quadruped Hip Abduction with Resistance Loop - 1 x daily - 7 x weekly - 3 sets - 10 reps  Access Code: [email or web address] modified 02-09-22 URL: [email or web address] Date: 02/09/2022 Prepared by: Voncille Lo   Standing Hip Hinge with Dowel - 1 x daily - 7 x weekly - 10 reps - 3 sets Goblet Squat with Kettlebell - 1 x daily - 7 x weekly - 10 reps - 3 sets Kettlebell Deadlift - 1 x daily - 7 x weekly - 3 sets Farmer's Carry with  Kettlebells - 1 x daily - 7 x weekly - 2 sets - 8 reps      ASSESSMENT:   CLINICAL IMPRESSION: Ms Henthorn is making excellent progress. And FOTO improved to 94% this visit and she achieved most LTG except for # 1 and # 6.  Pt still experiences a pull in R SI joint but has been noticing improvements with strengthening program.  Pt would like to return to exercise at gym and has been improving with progressive overload and advanced exericise.  Pt able to back squat 85# at end of session and box squat 45#  Will monitor for SI pain or irritation in subsequent visits.  Will continue toward completion of goals. Patient will benefit from skilled PT to address above impairments and improve overall function and decrease pain in back/S  GOALS: Goals reviewed with patient? Yes   SHORT TERM GOALS:   STG Name Target Date Goal status  1 Pt will be independent with initial HEP Baseline: need HEP 02/09/2022 ACHIEVED  2 Sit and stand with RT=LT wt bearing to reduce lumbar/sacral strain and allow for increased tolerance for these positions for work tasks Baseline: Wt bears to Right when standing 02-09-22 even wt bearing pt explains she is aware at work and at home getting off the chair 02/09/2022 ACHIEVED  3 Pt will be able to perform transfer movements without exacerbating pain Baseline:Pt increases pain with supine to sit and transfer out of car up to 9/10 pain 02-09-22 demo proper form 02/09/2022 ACHIEVED    LONG TERM GOALS:    LTG Name Target Date Goal status  1 Pt will be independent with advanced HEP in order to continue exercise with weights at a gym Baseline: Pt unable to exercise due to Left SI pain and bil LB pain 03/02/2022 ongoing  2 Pt will not wake due to pain while turning in bed while sleeping at night Baseline: Pt able to sleep through the night without waking due to pain 03/02/2022 ACHIEVED  3 FOTO will improve from 52%   to 74%    indicating improved functional mobility.   Baseline:02-21-22 94% 03/02/2022 ACHIEVED  4 Pt will be able to lift 25 # from floor in order to handle heavy groceries at home Baseline: working on deadlift  able to do 65 # x 5 02-09-22 03/02/2022 ACHIEVED  5 Pt will be able to negotiate steps without exacerbating pain Baseline:Pt avoids steps due to L SI jt pain 02-14-22 no pain alternating legs 03/02/2022 ACHIEVED  6 Pt will be able to perform household chores with proper body mechanics to decrease irritation of L SI and increase lumbar/sacral stability Baseline: Feels pull when cleaning out refrigerator or cleaning tub  02-21-22 03/02/2022 ongoing    PLAN: PT FREQUENCY: 2x/week   PT DURATION: 6 weeks   PLANNED INTERVENTIONS: Therapeutic exercises, Therapeutic activity, Neuro Muscular re-education, Balance training, Gait training, Patient/Family education, Joint mobilization, Stair training, Dry Needling, Electrical stimulation, Cryotherapy, Moist heat, Taping, Ionotophoresis 82m/ml Dexamethasone, Manual therapy, and Lumbar/Sacral Stabilization   PLAN FOR NEXT SESSION:  hamstring on physio ball, thruster with weight on edge of mat, review  lift bar / back squat.  Deadlift next session max it out      LVoncille Lo PT, AVirtua West Jersey Hospital - VoorheesCertified Exercise Expert for the Aging Adult  02/21/22 1:07 PM Phone: [phone]: [phone]

## 2022-02-21 ENCOUNTER — Ambulatory Visit: Payer: 59 | Admitting: Physical Therapy

## 2022-02-21 ENCOUNTER — Other Ambulatory Visit: Payer: Self-pay

## 2022-02-21 ENCOUNTER — Encounter: Payer: Self-pay | Admitting: Physical Therapy

## 2022-02-21 DIAGNOSIS — M5440 Lumbago with sciatica, unspecified side: Secondary | ICD-10-CM | POA: Diagnosis not present

## 2022-02-21 DIAGNOSIS — M6281 Muscle weakness (generalized): Secondary | ICD-10-CM | POA: Diagnosis not present

## 2022-02-21 DIAGNOSIS — R262 Difficulty in walking, not elsewhere classified: Secondary | ICD-10-CM | POA: Diagnosis not present

## 2022-02-22 NOTE — Therapy (Incomplete)
OUTPATIENT PHYSICAL THERAPY TREATMENT NOTE   Patient Name: Karen Burnett MRN: [id number] DOB:May 11, 1996, 26 y.o., female Today's Date: 02/22/2022  PCP: Rise Patience, DO REFERRING PROVIDER: Zenia Resides, MD         Past Medical History:  Diagnosis Date   Anxiety    Ultrasound recheck of fetal pyelectasis, antepartum 05/24/2017   Vaginal Pap smear, abnormal 10/2016   ASCUS, HPV neg so needs routine screen   Past Surgical History:  Procedure Laterality Date   NO PAST SURGERIES     Patient Active Problem List   Diagnosis Date Noted   SI (sacroiliac) joint dysfunction 01/09/2022   Upper back pain 03/11/2021   Overweight 12/19/2018   Generalized anxiety disorder 01/02/2017   Family history of diabetes mellitus 10/01/2015   Nexplanon insertion 06/24/2015    REFERRING DIAG: M53.3 (ICD-10-CM) - SI (sacroiliac) joint dysfunction  THERAPY DIAG:  No diagnosis found.  PERTINENT HISTORY: SI problems since high school, Nothing remarkable  PRECAUTIONS: None  SUBJECTIVE:  ***  PAIN:  Are you having pain? no NPRS scale: 0/10 at 5th visit  at worst 7/10  Pt with pulling but no pain Pain location: Left SI  Pain orientation: Other: R>L pubic pain,  L SI    PAIN TYPE: acute on chronic Pain description: intermittent, sharp, stabbing, and shooting   Aggravating factors: bending over, picking up kids and groceries, sweeping and mopping going up and  down stairs Relieving factors: not moving, lying down    OBJECTIVE:    DIAGNOSTIC FINDINGS:  none   PATIENT SURVEYS:  FOTO 52% predicted 74% 02-21-22 94% Goal achieved   SCREENING FOR RED FLAGS: Bowel or bladder incontinence: No Spinal tumors: No Cauda equina syndrome: No Compression fracture: No Abdominal aneurysm: No   COGNITION:          Overall cognitive status: Within functional limits for tasks assessed                        SENSATION:          Light touch: Appears intact          Stereognosis: Appears  intact          Hot/Cold: Appears intact          Proprioception: Appears intact   MUSCLE LENGTH: Hamstrings: Right 75 deg; Left 60 deg pain in Left SI Thomas test: WNL   POSTURE:           Left SI more posterior than R anteriorly rotated. Slightly elevated R pelvic level. Left Sacral rotaion R pubic bone more superior and anterior 2-3/23 R SI more posterior    PALPATION: TTP on on  L SI and Left QL. Left SI [place] test L doesn't move , pain ful pubic symphysis   LUMBARAROM/PROM   A/PROM A/PROM  01/19/2022  Flexion Can touch ankle bil Pain on returning to upright  Extension WNL  Right lateral flexion 2 in finger tip below knee jt line P with returning to upright posture  Left lateral flexion 2.5 finger tip to below knee jt line  Right rotation WNL  Left rotation WNL   (Blank rows = not tested)   LE AROM/PROM:                               AROM WNL throughout LE A/PROM Right 01/19/2022 Left 01/19/2022  Hip flexion 123 117 pain on L SI  Hip extension      Hip abduction      Hip adduction      Hip internal rotation      Hip external rotation      Knee flexion      Knee extension      Ankle dorsiflexion      Ankle plantarflexion      Ankle inversion      Ankle eversion       (Blank rows = not tested)   LE MMT:                     Left pain in SI with any movement MMT Right 01/19/2022 Left 01/19/2022  Hip flexion 5/5 4/5 pain in Left SI  Hip extension 4+/5 4+/5  Hip abduction 4+/5 4/5  Hip adduction      Hip internal rotation      Hip external rotation      Knee flexion 5/5 5/5  Knee extension 5/5 5/5  Ankle dorsiflexion 5/5 5/5  Ankle plantarflexion 5/5 5/5  Ankle inversion      Ankle eversion       (Blank rows = not tested)   LUMBAR SPECIAL TESTS:  Straight leg raise test: Positive, Slump test: Negative, SI Compression/distraction test: Positive, and FABER test: Positive   FUNCTIONAL TESTS:  Squat pt unable to squat fully wt bears to R   GAIT: Distance  walked: 150 Assistive device utilized: None Level of assistance: Complete Independence Comments: Pt walks slowly but normally to not exacerbate L SI pain       TODAY'S TREATMENT   [facility] Adult PT Treatment:                                                DATE: 02-23-22 Therapeutic Exercise: *** Manual Therapy: *** Neuromuscular re-ed: *** Therapeutic Activity: *** Modalities: *** Self Care: ***  [facility] Adult PT Treatment:                                                DATE: 02-21-22 Therapeutic Exercise: Elliptical 8 minutes 20 sec fast and then 40 sec Back squat with lift bar 45#  x 5 then with 75# x 6,  , then with 85#  5 x 2 Leg press cybex 120# 3 x 10 Cybex  hip extension with 50 lb 2 x 10, R and L Cybex hip abduction knee/hip flex 80 degrees 2 x 10 R and L  Box squat  14 inch from ground 45#  DB  3 x 10  [facility] Adult PT Treatment:                                                DATE: 02-16-22 Therapeutic Exercise:  Elliptical 8 minutes RPE 5/6 Banded SLS clams with BTB 3 x 10 Childs pose forward and side to side 30 sec x 4 SL bridge off edge of mat with VC for head position 2 x 10 R and L with rest between sets Standing quad set 30 sec x 2 R and L each Physioball  crunch -roll up thighs x 20 Side plank   from knees on L  3 x 30 sec and then on R 3 x 30sec  weaker on L Front plank  on elbow 3 x 30 sec Goblet squat 30 # 1 x 8  then did 2 more Bird dog x 10 each side - on airex  Piriformis stretch bilat Figure 4 stretch  bilat    [facility] Adult PT Treatment:                                                DATE: 02/14/22 Therapeutic Exercise:  Elliptical 8 minutes RPE 5/6 AMRAP ( as many reps as possible ) 3 rounds       Goblet squat 25 # Round #1 26 reps, round #2 27 reps        Lat pull blue t band Round #1 R 10, L 10;Round#2 R 13reps L13reps        Reverse lunge 10 # Round #1 R 15 L 14 Round#2 R 11, L 10        Cybex hip ext  # 25 #  Round #1 R 12 L14 Round#2  R 15, L 15  reps    [facility] Adult PT Treatment:                                                DATE: 02-09-22 Therapeutic Exercise:   Standing Hip Hinge with Dowel - x 15 to demo and then hip hinge against wall x 15 as external cue Goblet Squat with Kettlebell 2 x10 25# to box 1 x 10 Kettlebell Deadlift initial 15 # KB 2 x 5, 25# KB 2 x 8,  45 # KB 1 x 8, 65 lift bar 1 x 5 reps VC and TC for each lift Farmer's Carry with Kettlebells 2 x carrying R hadn 25 # KB and L hand 15 # KB Physioball crunch -roll up thighs x 20 Bridge with ankles on  red physio ballball 5 sec x 10  Heels on physioball- knee flexion with abdominal focus x 20 - (able to do 3 then  rest until 20 total due to fatigue Blue band hooklying clam Blue band bridge with clams during lift 3 rounds of 10 Alternating bent knee fall out with blue band Bridge with ball squeeze-arms crossed x 10 Bird dog x 10- on airex  Sit-stand blue band  at knees 3 x 10 with 10# dumbbell at chest Piriformis stretch bilat Figure 4 stretch  bilat Double knee to chest   [facility] Adult PT Treatment:                                                DATE: 02-02-22  Hips aligned at start of session: no MET needed  Therapeutic Exercise:  Physioball crunch -roll up thighs x 20 Bridge with ankles on ball 5 sec x 10  Heels on physioball- knee flexion with abdominal focus x 20 -  Blue band hooklying clam Blue band bridge with clams during lift 3 rounds of 10 Alternating bent knee  fall out with blue band Bridge with ball squeeze-arms crossed x 10 Bird dog x 10- on airex  Sit-stand blue band  at knees 3 x 10 with 10# dumbbell at chest Piriformis stretch bilat Figure 4 stretch  bilat Double knee to chest            PATIENT EDUCATION:  Education details: POC  went over FOTO report, given HEP (basic Pre Pilates) updated HEP for strengthening and stretching, added AMRAP and EMOM to HEP Person educated: Patient Education method: Explanation, Demonstration,  Tactile cues, Verbal cues, and Handouts Education comprehension: verbalized understanding, returned demonstration, verbal cues required, tactile cues required, and needs further education     HOME EXERCISE PROGRAM: Pre pilates   Pelvic Tilt, abdominal bracing, Knee fold and Knee drop Access Code: [email or web address] URL: [email or web address] Date: 01/26/2022 Prepared by: Voncille Lo  Program Notes You can always do the pre pilates exercises given when you are tender.   Exercises Supine Double Knee to Chest - 1-2 x daily - 7 x weekly - 1 sets - 5-10 reps - 10 hold Supine Lower Trunk Rotation - 1-2 x daily - 7 x weekly - 1 sets - 5 reps Supine Piriformis Stretch Pulling Heel to Hip - 1-2 x daily - 7 x weekly - 1 sets - 23 reps - 30 sec hold Supine Bridge with Mini Swiss Ball Between Knees - 1 x daily - 7 x weekly - 3 sets - 10 reps Single Leg Bridge - 1 x daily - 7 x weekly - 3 sets - 10 reps Quadruped Rocking Slow - 1 x daily - 7 x weekly - 3 sets - 10 reps Quadruped Forearm Plank with a Mule Kick - 1 x daily - 7 x weekly - 3 sets - 10 reps Quadruped Hip Abduction with Resistance Loop - 1 x daily - 7 x weekly - 3 sets - 10 reps  Access Code: [email or web address] modified 02-09-22 URL: [email or web address] Date: 02/09/2022 Prepared by: Voncille Lo   Standing Hip Hinge with Dowel - 1 x daily - 7 x weekly - 10 reps - 3 sets Goblet Squat with Kettlebell - 1 x daily - 7 x weekly - 10 reps - 3 sets Kettlebell Deadlift - 1 x daily - 7 x weekly - 3 sets Farmer's Carry with Kettlebells - 1 x daily - 7 x weekly - 2 sets - 8 reps      ASSESSMENT:   CLINICAL IMPRESSION: Ms Trimarco is making excellent progress. And FOTO improved to 94% this visit and she achieved most LTG except for # 1 and # 6.  Pt still experiences a pull in R SI joint but has been noticing improvements with strengthening program.  Pt would like to return to exercise at gym and has been improving with  progressive overload and advanced exericise.  Pt able to back squat 85# at end of session and box squat 45#  Will monitor for SI pain or irritation in subsequent visits.  Will continue toward completion of goals. Patient will benefit from skilled PT to address above impairments and improve overall function and decrease pain in back/S       GOALS: Goals reviewed with patient? Yes   SHORT TERM GOALS:   STG Name Target Date Goal status  1 Pt will be independent with initial HEP Baseline: need HEP 02/09/2022 ACHIEVED  2 Sit and stand with RT=LT wt bearing to reduce lumbar/sacral strain and allow for increased tolerance for these positions for work tasks Baseline:  Wt bears to Right when standing 02-09-22 even wt bearing pt explains she is aware at work and at home getting off the chair 02/09/2022 ACHIEVED  3 Pt will be able to perform transfer movements without exacerbating pain Baseline:Pt increases pain with supine to sit and transfer out of car up to 9/10 pain 02-09-22 demo proper form 02/09/2022 ACHIEVED    LONG TERM GOALS:    LTG Name Target Date Goal status  1 Pt will be independent with advanced HEP in order to continue exercise with weights at a gym Baseline: Pt unable to exercise due to Left SI pain and bil LB pain 03/02/2022 ongoing  2 Pt will not wake due to pain while turning in bed while sleeping at night Baseline: Pt able to sleep through the night without waking due to pain 03/02/2022 ACHIEVED  3 FOTO will improve from 52%   to 74%    indicating improved functional mobility.  Baseline:02-21-22 94% 03/02/2022 ACHIEVED  4 Pt will be able to lift 25 # from floor in order to handle heavy groceries at home Baseline: working on deadlift  able to do 65 # x 5 02-09-22 03/02/2022 ACHIEVED  5 Pt will be able to negotiate steps without exacerbating pain Baseline:Pt avoids steps due to L SI jt pain 02-14-22 no pain alternating legs 03/02/2022 ACHIEVED  6 Pt will be able to perform household chores  with proper body mechanics to decrease irritation of L SI and increase lumbar/sacral stability Baseline: Feels pull when cleaning out refrigerator or cleaning tub  02-21-22 03/02/2022 ongoing    PLAN: PT FREQUENCY: 2x/week   PT DURATION: 6 weeks   PLANNED INTERVENTIONS: Therapeutic exercises, Therapeutic activity, Neuro Muscular re-education, Balance training, Gait training, Patient/Family education, Joint mobilization, Stair training, Dry Needling, Electrical stimulation, Cryotherapy, Moist heat, Taping, Ionotophoresis 87m/ml Dexamethasone, Manual therapy, and Lumbar/Sacral Stabilization   PLAN FOR NEXT SESSION:  hamstring on physio ball, thruster with weight on edge of mat, review  lift bar / back squat.  Deadlift next session max it out     ***

## 2022-02-23 ENCOUNTER — Ambulatory Visit: Payer: 59 | Admitting: Physical Therapy

## 2022-02-23 NOTE — Therapy (Incomplete)
OUTPATIENT PHYSICAL THERAPY TREATMENT NOTE   Patient Name: Karen Burnett MRN: [id number] DOB:28-Nov-1996, 26 y.o., female Today's Date: 02/23/2022  PCP: Rise Patience, DO REFERRING PROVIDER: Rise Patience, DO         Past Medical History:  Diagnosis Date   Anxiety    Ultrasound recheck of fetal pyelectasis, antepartum 05/24/2017   Vaginal Pap smear, abnormal 10/2016   ASCUS, HPV neg so needs routine screen   Past Surgical History:  Procedure Laterality Date   NO PAST SURGERIES     Patient Active Problem List   Diagnosis Date Noted   SI (sacroiliac) joint dysfunction 01/09/2022   Upper back pain 03/11/2021   Overweight 12/19/2018   Generalized anxiety disorder 01/02/2017   Family history of diabetes mellitus 10/01/2015   Nexplanon insertion 06/24/2015    REFERRING DIAG: M53.3 (ICD-10-CM) - SI (sacroiliac) joint dysfunction  THERAPY DIAG:  No diagnosis found.  PERTINENT HISTORY: SI problems since high school, Nothing remarkable  PRECAUTIONS: None  SUBJECTIVE:  ***  PAIN:  Are you having pain? no NPRS scale: 0/10 at 5th visit  at worst 7/10  Pt with pulling but no pain Pain location: Left SI  Pain orientation: Other: R>L pubic pain,  L SI    PAIN TYPE: acute on chronic Pain description: intermittent, sharp, stabbing, and shooting   Aggravating factors: bending over, picking up kids and groceries, sweeping and mopping going up and  down stairs Relieving factors: not moving, lying down    OBJECTIVE:    DIAGNOSTIC FINDINGS:  none   PATIENT SURVEYS:  FOTO 52% predicted 74% 02-21-22 94% Goal achieved   SCREENING FOR RED FLAGS: Bowel or bladder incontinence: No Spinal tumors: No Cauda equina syndrome: No Compression fracture: No Abdominal aneurysm: No   COGNITION:          Overall cognitive status: Within functional limits for tasks assessed                        SENSATION:          Light touch: Appears intact          Stereognosis: Appears  intact          Hot/Cold: Appears intact          Proprioception: Appears intact   MUSCLE LENGTH: Hamstrings: Right 75 deg; Left 60 deg pain in Left SI Thomas test: WNL   POSTURE:           Left SI more posterior than R anteriorly rotated. Slightly elevated R pelvic level. Left Sacral rotaion R pubic bone more superior and anterior 2-3/23 R SI more posterior    PALPATION: TTP on on  L SI and Left QL. Left SI [place] test L doesn't move , pain ful pubic symphysis   LUMBARAROM/PROM   A/PROM A/PROM  01/19/2022  Flexion Can touch ankle bil Pain on returning to upright  Extension WNL  Right lateral flexion 2 in finger tip below knee jt line P with returning to upright posture  Left lateral flexion 2.5 finger tip to below knee jt line  Right rotation WNL  Left rotation WNL   (Blank rows = not tested)   LE AROM/PROM:                               AROM WNL throughout LE A/PROM Right 01/19/2022 Left 01/19/2022  Hip flexion 123 117 pain on L SI  Hip extension      Hip abduction      Hip adduction      Hip internal rotation      Hip external rotation      Knee flexion      Knee extension      Ankle dorsiflexion      Ankle plantarflexion      Ankle inversion      Ankle eversion       (Blank rows = not tested)   LE MMT:                     Left pain in SI with any movement MMT Right 01/19/2022 Left 01/19/2022  Hip flexion 5/5 4/5 pain in Left SI  Hip extension 4+/5 4+/5  Hip abduction 4+/5 4/5  Hip adduction      Hip internal rotation      Hip external rotation      Knee flexion 5/5 5/5  Knee extension 5/5 5/5  Ankle dorsiflexion 5/5 5/5  Ankle plantarflexion 5/5 5/5  Ankle inversion      Ankle eversion       (Blank rows = not tested)   LUMBAR SPECIAL TESTS:  Straight leg raise test: Positive, Slump test: Negative, SI Compression/distraction test: Positive, and FABER test: Positive   FUNCTIONAL TESTS:  Squat pt unable to squat fully wt bears to R   GAIT: Distance  walked: 150 Assistive device utilized: None Level of assistance: Complete Independence Comments: Pt walks slowly but normally to not exacerbate L SI pain       TODAY'S TREATMENT   [facility] Adult PT Treatment:                                                DATE: 02-23-22 Therapeutic Exercise: *** Manual Therapy: *** Neuromuscular re-ed: *** Therapeutic Activity: *** Modalities: *** Self Care: ***  [facility] Adult PT Treatment:                                                DATE: 02-21-22 Therapeutic Exercise: Elliptical 8 minutes 20 sec fast and then 40 sec Back squat with lift bar 45#  x 5 then with 75# x 6,  , then with 85#  5 x 2 Leg press cybex 120# 3 x 10 Cybex  hip extension with 50 lb 2 x 10, R and L Cybex hip abduction knee/hip flex 80 degrees 2 x 10 R and L  Box squat  14 inch from ground 45#  DB  3 x 10  [facility] Adult PT Treatment:                                                DATE: 02-16-22 Therapeutic Exercise:  Elliptical 8 minutes RPE 5/6 Banded SLS clams with BTB 3 x 10 Childs pose forward and side to side 30 sec x 4 SL bridge off edge of mat with VC for head position 2 x 10 R and L with rest between sets Standing quad set 30 sec x 2 R and L each Physioball  crunch -roll up thighs x 20 Side plank   from knees on L  3 x 30 sec and then on R 3 x 30sec  weaker on L Front plank  on elbow 3 x 30 sec Goblet squat 30 # 1 x 8  then did 2 more Bird dog x 10 each side - on airex  Piriformis stretch bilat Figure 4 stretch  bilat    [facility] Adult PT Treatment:                                                DATE: 02/14/22 Therapeutic Exercise:  Elliptical 8 minutes RPE 5/6 AMRAP ( as many reps as possible ) 3 rounds       Goblet squat 25 # Round #1 26 reps, round #2 27 reps        Lat pull blue t band Round #1 R 10, L 10;Round#2 R 13reps L13reps        Reverse lunge 10 # Round #1 R 15 L 14 Round#2 R 11, L 10        Cybex hip ext  # 25 #  Round #1 R 12 L14 Round#2  R 15, L 15  reps    [facility] Adult PT Treatment:                                                DATE: 02-09-22 Therapeutic Exercise:   Standing Hip Hinge with Dowel - x 15 to demo and then hip hinge against wall x 15 as external cue Goblet Squat with Kettlebell 2 x10 25# to box 1 x 10 Kettlebell Deadlift initial 15 # KB 2 x 5, 25# KB 2 x 8,  45 # KB 1 x 8, 65 lift bar 1 x 5 reps VC and TC for each lift Farmer's Carry with Kettlebells 2 x carrying R hadn 25 # KB and L hand 15 # KB Physioball crunch -roll up thighs x 20 Bridge with ankles on  red physio ballball 5 sec x 10  Heels on physioball- knee flexion with abdominal focus x 20 - (able to do 3 then  rest until 20 total due to fatigue Blue band hooklying clam Blue band bridge with clams during lift 3 rounds of 10 Alternating bent knee fall out with blue band Bridge with ball squeeze-arms crossed x 10 Bird dog x 10- on airex  Sit-stand blue band  at knees 3 x 10 with 10# dumbbell at chest Piriformis stretch bilat Figure 4 stretch  bilat Double knee to chest   [facility] Adult PT Treatment:                                                DATE: 02-02-22  Hips aligned at start of session: no MET needed  Therapeutic Exercise:  Physioball crunch -roll up thighs x 20 Bridge with ankles on ball 5 sec x 10  Heels on physioball- knee flexion with abdominal focus x 20 -  Blue band hooklying clam Blue band bridge with clams during lift 3 rounds of 10 Alternating bent knee  fall out with blue band Bridge with ball squeeze-arms crossed x 10 Bird dog x 10- on airex  Sit-stand blue band  at knees 3 x 10 with 10# dumbbell at chest Piriformis stretch bilat Figure 4 stretch  bilat Double knee to chest            PATIENT EDUCATION:  Education details: POC  went over FOTO report, given HEP (basic Pre Pilates) updated HEP for strengthening and stretching, added AMRAP and EMOM to HEP Person educated: Patient Education method: Explanation, Demonstration,  Tactile cues, Verbal cues, and Handouts Education comprehension: verbalized understanding, returned demonstration, verbal cues required, tactile cues required, and needs further education     HOME EXERCISE PROGRAM: Pre pilates   Pelvic Tilt, abdominal bracing, Knee fold and Knee drop Access Code: [email or web address] URL: [email or web address] Date: 01/26/2022 Prepared by: Voncille Lo  Program Notes You can always do the pre pilates exercises given when you are tender.   Exercises Supine Double Knee to Chest - 1-2 x daily - 7 x weekly - 1 sets - 5-10 reps - 10 hold Supine Lower Trunk Rotation - 1-2 x daily - 7 x weekly - 1 sets - 5 reps Supine Piriformis Stretch Pulling Heel to Hip - 1-2 x daily - 7 x weekly - 1 sets - 23 reps - 30 sec hold Supine Bridge with Mini Swiss Ball Between Knees - 1 x daily - 7 x weekly - 3 sets - 10 reps Single Leg Bridge - 1 x daily - 7 x weekly - 3 sets - 10 reps Quadruped Rocking Slow - 1 x daily - 7 x weekly - 3 sets - 10 reps Quadruped Forearm Plank with a Mule Kick - 1 x daily - 7 x weekly - 3 sets - 10 reps Quadruped Hip Abduction with Resistance Loop - 1 x daily - 7 x weekly - 3 sets - 10 reps  Access Code: [email or web address] modified 02-09-22 URL: [email or web address] Date: 02/09/2022 Prepared by: Voncille Lo   Standing Hip Hinge with Dowel - 1 x daily - 7 x weekly - 10 reps - 3 sets Goblet Squat with Kettlebell - 1 x daily - 7 x weekly - 10 reps - 3 sets Kettlebell Deadlift - 1 x daily - 7 x weekly - 3 sets Farmer's Carry with Kettlebells - 1 x daily - 7 x weekly - 2 sets - 8 reps      ASSESSMENT:   CLINICAL IMPRESSION: Ms Knowles is making excellent progress. And FOTO improved to 94% this visit and she achieved most LTG except for # 1 and # 6.  Pt still experiences a pull in R SI joint but has been noticing improvements with strengthening program.  Pt would like to return to exercise at gym and has been improving with  progressive overload and advanced exericise.  Pt able to back squat 85# at end of session and box squat 45#  Will monitor for SI pain or irritation in subsequent visits.  Will continue toward completion of goals. Patient will benefit from skilled PT to address above impairments and improve overall function and decrease pain in back/S       GOALS: Goals reviewed with patient? Yes   SHORT TERM GOALS:   STG Name Target Date Goal status  1 Pt will be independent with initial HEP Baseline: need HEP 02/09/2022 ACHIEVED  2 Sit and stand with RT=LT wt bearing to reduce lumbar/sacral strain and allow for increased tolerance for these positions for work tasks Baseline:  Wt bears to Right when standing 02-09-22 even wt bearing pt explains she is aware at work and at home getting off the chair 02/09/2022 ACHIEVED  3 Pt will be able to perform transfer movements without exacerbating pain Baseline:Pt increases pain with supine to sit and transfer out of car up to 9/10 pain 02-09-22 demo proper form 02/09/2022 ACHIEVED    LONG TERM GOALS:    LTG Name Target Date Goal status  1 Pt will be independent with advanced HEP in order to continue exercise with weights at a gym Baseline: Pt unable to exercise due to Left SI pain and bil LB pain 03/02/2022 ongoing  2 Pt will not wake due to pain while turning in bed while sleeping at night Baseline: Pt able to sleep through the night without waking due to pain 03/02/2022 ACHIEVED  3 FOTO will improve from 52%   to 74%    indicating improved functional mobility.  Baseline:02-21-22 94% 03/02/2022 ACHIEVED  4 Pt will be able to lift 25 # from floor in order to handle heavy groceries at home Baseline: working on deadlift  able to do 65 # x 5 02-09-22 03/02/2022 ACHIEVED  5 Pt will be able to negotiate steps without exacerbating pain Baseline:Pt avoids steps due to L SI jt pain 02-14-22 no pain alternating legs 03/02/2022 ACHIEVED  6 Pt will be able to perform household chores  with proper body mechanics to decrease irritation of L SI and increase lumbar/sacral stability Baseline: Feels pull when cleaning out refrigerator or cleaning tub  02-21-22 03/02/2022 ongoing    PLAN: PT FREQUENCY: 2x/week   PT DURATION: 6 weeks   PLANNED INTERVENTIONS: Therapeutic exercises, Therapeutic activity, Neuro Muscular re-education, Balance training, Gait training, Patient/Family education, Joint mobilization, Stair training, Dry Needling, Electrical stimulation, Cryotherapy, Moist heat, Taping, Ionotophoresis 87m/ml Dexamethasone, Manual therapy, and Lumbar/Sacral Stabilization   PLAN FOR NEXT SESSION:  hamstring on physio ball, thruster with weight on edge of mat, review  lift bar / back squat.  Deadlift next session max it out     ***

## 2022-02-24 ENCOUNTER — Encounter: Payer: Self-pay | Admitting: Physical Therapy

## 2022-02-24 ENCOUNTER — Other Ambulatory Visit: Payer: Self-pay

## 2022-02-24 ENCOUNTER — Ambulatory Visit: Payer: 59 | Admitting: Physical Therapy

## 2022-02-24 DIAGNOSIS — R262 Difficulty in walking, not elsewhere classified: Secondary | ICD-10-CM

## 2022-02-24 DIAGNOSIS — M5440 Lumbago with sciatica, unspecified side: Secondary | ICD-10-CM

## 2022-02-24 DIAGNOSIS — M6281 Muscle weakness (generalized): Secondary | ICD-10-CM

## 2022-02-24 NOTE — Therapy (Signed)
OUTPATIENT PHYSICAL THERAPY TREATMENT NOTE   Patient Name: Karen Burnett MRN: [id number] DOB:December 22, 1995, 26 y.o., female Today's Date: 02/24/2022  PCP: Rise Patience, DO REFERRING PROVIDER: Rise Patience, DO   PT End of Session - 02/24/22 0728     Visit Number 9    Number of Visits 12    Date for PT Re-Evaluation 03/02/22    Authorization Type [employer]    PT Start Time 0720    PT Stop Time 0800    PT Time Calculation (min) 40 min                 Past Medical History:  Diagnosis Date   Anxiety    Ultrasound recheck of fetal pyelectasis, antepartum 05/24/2017   Vaginal Pap smear, abnormal 10/2016   ASCUS, HPV neg so needs routine screen   Past Surgical History:  Procedure Laterality Date   NO PAST SURGERIES     Patient Active Problem List   Diagnosis Date Noted   SI (sacroiliac) joint dysfunction 01/09/2022   Upper back pain 03/11/2021   Overweight 12/19/2018   Generalized anxiety disorder 01/02/2017   Family history of diabetes mellitus 10/01/2015   Nexplanon insertion 06/24/2015    REFERRING DIAG: M53.3 (ICD-10-CM) - SI (sacroiliac) joint dysfunction  THERAPY DIAG:  Acute right-sided low back pain with sciatica, sciatica laterality unspecified  Muscle weakness (generalized)  Difficulty in walking, not elsewhere classified  PERTINENT HISTORY: SI problems since high school, Nothing remarkable  PRECAUTIONS: None  SUBJECTIVE:I have been doing better at the gym. I sometimes feel pull on my R SI with doing cleaning out of the refrigerator and making beds but no real pain.   PAIN:  Are you having pain? no NPRS scale: 0/10 at 5th visit  at worst 7/10  Pt with pulling but no pain Pain location: Left SI  Pain orientation: Other: R>L pubic pain,  L SI    PAIN TYPE: acute on chronic Pain description: intermittent, sharp, stabbing, and shooting   Aggravating factors: bending over, picking up kids and groceries, sweeping and mopping going up and  down  stairs Relieving factors: not moving, lying down    OBJECTIVE:    DIAGNOSTIC FINDINGS:  none   PATIENT SURVEYS:  FOTO 52% predicted 74% 02-21-22 94% Goal achieved   SCREENING FOR RED FLAGS: Bowel or bladder incontinence: No Spinal tumors: No Cauda equina syndrome: No Compression fracture: No Abdominal aneurysm: No   COGNITION:          Overall cognitive status: Within functional limits for tasks assessed                        SENSATION:          Light touch: Appears intact          Stereognosis: Appears intact          Hot/Cold: Appears intact          Proprioception: Appears intact   MUSCLE LENGTH: Hamstrings: Right 75 deg; Left 60 deg pain in Left SI Thomas test: WNL   POSTURE:           Left SI more posterior than R anteriorly rotated. Slightly elevated R pelvic level. Left Sacral rotaion R pubic bone more superior and anterior 2-3/23 R SI more posterior    PALPATION: TTP on on  L SI and Left QL. Left SI Gil[place] test L doesn't move , pain ful pubic symphysis   LUMBARAROM/PROM   A/PROM A/PROM  01/19/2022  Flexion Can touch ankle bil Pain on returning to upright  Extension WNL  Right lateral flexion 2 in finger tip below knee jt line P with returning to upright posture  Left lateral flexion 2.5 finger tip to below knee jt line  Right rotation WNL  Left rotation WNL   (Blank rows = not tested)   LE AROM/PROM:                               AROM WNL throughout LE A/PROM Right 01/19/2022 Left 01/19/2022  Hip flexion 123 117 pain on L SI  Hip extension      Hip abduction      Hip adduction      Hip internal rotation      Hip external rotation      Knee flexion      Knee extension      Ankle dorsiflexion      Ankle plantarflexion      Ankle inversion      Ankle eversion       (Blank rows = not tested)   LE MMT:                     Left pain in SI with any movement MMT Right 01/19/2022 Left 01/19/2022  Hip flexion 5/5 4/5 pain in Left SI  Hip extension  4+/5 4+/5  Hip abduction 4+/5 4/5  Hip adduction      Hip internal rotation      Hip external rotation      Knee flexion 5/5 5/5  Knee extension 5/5 5/5  Ankle dorsiflexion 5/5 5/5  Ankle plantarflexion 5/5 5/5  Ankle inversion      Ankle eversion       (Blank rows = not tested)   LUMBAR SPECIAL TESTS:  Straight leg raise test: Positive, Slump test: Negative, SI Compression/distraction test: Positive, and FABER test: Positive   FUNCTIONAL TESTS:  Squat pt unable to squat fully wt bears to R   GAIT: Distance walked: 150 Assistive device utilized: None Level of assistance: Complete Independence Comments: Pt walks slowly but normally to not exacerbate L SI pain       TODAY'S TREATMENT  [facility] Adult PT Treatment:                                                DATE: 02-24-22 Therapeutic Exercise: Elliptical 8 minutes 20 sec fast and then 40 sec Back squat 95#  x 5 x 8 x 8 Leg press cybex 120# 3 x 10 Cybex  hip extension with 50 lb 2 x 10, R and L Cybex hip abduction knee/hip flex 80 degrees 2 x 10 R and L 37.5#  Box squat  14 inch from ground 45#  DB  3 x 10 Double Knee to chest stretch Piriformis and figure 4 stretches bilateral Self Care: Discussed body mechanics with household chores like cleaning tub and refrigerator via hip hinge/ squat, golfers pick up  [facility] Adult PT Treatment:                                                DATE: 02-21-22  Therapeutic Exercise: Elliptical 8 minutes 20 sec fast and then 40 sec Back squat with lift bar 45#  x 5 then with 75# x 6,  , then with 85#  5 x 2 Leg press cybex 120# 3 x 10 Cybex  hip extension with 50 lb 2 x 10, R and L Cybex hip abduction knee/hip flex 80 degrees 2 x 10 R and L  Box squat  14 inch from ground 45#  DB  3 x 10  [facility] Adult PT Treatment:                                                DATE: 02-16-22 Therapeutic Exercise:  Elliptical 8 minutes RPE 5/6 Banded SLS clams with BTB 3 x 10 Childs pose forward and side to  side 30 sec x 4 SL bridge off edge of mat with VC for head position 2 x 10 R and L with rest between sets Standing quad set 30 sec x 2 R and L each Physioball crunch -roll up thighs x 20 Side plank   from knees on L  3 x 30 sec and then on R 3 x 30sec  weaker on L Front plank  on elbow 3 x 30 sec Goblet squat 30 # 1 x 8  then did 2 more Bird dog x 10 each side - on airex  Piriformis stretch bilat Figure 4 stretch  bilat    [facility] Adult PT Treatment:                                                DATE: 02/14/22 Therapeutic Exercise:  Elliptical 8 minutes RPE 5/6 AMRAP ( as many reps as possible ) 3 rounds       Goblet squat 25 # Round #1 26 reps, round #2 27 reps        Lat pull blue t band Round #1 R 10, L 10;Round#2 R 13reps L13reps        Reverse lunge 10 # Round #1 R 15 L 14 Round#2 R 11, L 10        Cybex hip ext  # 25 #  Round #1 R 12 L14 Round#2  R 15, L 15 reps    [facility] Adult PT Treatment:                                                DATE: 02-09-22 Therapeutic Exercise:   Standing Hip Hinge with Dowel - x 15 to demo and then hip hinge against wall x 15 as external cue Goblet Squat with Kettlebell 2 x10 25# to box 1 x 10 Kettlebell Deadlift initial 15 # KB 2 x 5, 25# KB 2 x 8,  45 # KB 1 x 8, 65 lift bar 1 x 5 reps VC and TC for each lift Farmer's Carry with Kettlebells 2 x carrying R hadn 25 # KB and L hand 15 # KB Physioball crunch -roll up thighs x 20 Bridge with ankles on  red physio ballball 5 sec x 10  Heels on physioball- knee flexion with  abdominal focus x 20 - (able to do 3 then  rest until 20 total due to fatigue Blue band hooklying clam Blue band bridge with clams during lift 3 rounds of 10 Alternating bent knee fall out with blue band Bridge with ball squeeze-arms crossed x 10 Bird dog x 10- on airex  Sit-stand blue band  at knees 3 x 10 with 10# dumbbell at chest Piriformis stretch bilat Figure 4 stretch  bilat Double knee to chest   [facility] Adult [facility]  Treatment:                                                DATE: 02-02-22  Hips aligned at start of session: no MET needed  Therapeutic Exercise:  Physioball crunch -roll up thighs x 20 Bridge with ankles on ball 5 sec x 10  Heels on physioball- knee flexion with abdominal focus x 20 -  Blue band hooklying clam Blue band bridge with clams during lift 3 rounds of 10 Alternating bent knee fall out with blue band Bridge with ball squeeze-arms crossed x 10 Bird dog x 10- on airex  Sit-stand blue band  at knees 3 x 10 with 10# dumbbell at chest Piriformis stretch bilat Figure 4 stretch  bilat Double knee to chest            PATIENT EDUCATION:  Education details: POC  went over FOTO report, given HEP (basic Pre Pilates) updated HEP for strengthening and stretching, added AMRAP and EMOM to HEP Person educated: Patient Education method: Explanation, Demonstration, Tactile cues, Verbal cues, and Handouts Education comprehension: verbalized understanding, returned demonstration, verbal cues required, tactile cues required, and needs further education     HOME EXERCISE PROGRAM: Pre pilates   Pelvic Tilt, abdominal bracing, Knee fold and Knee drop Access Code: [email or web address] URL: [email or web address] Date: 01/26/2022 Prepared by: Voncille Lo  Program Notes You can always do the pre pilates exercises given when you are tender.   Exercises Supine Double Knee to Chest - 1-2 x daily - 7 x weekly - 1 sets - 5-10 reps - 10 hold Supine Lower Trunk Rotation - 1-2 x daily - 7 x weekly - 1 sets - 5 reps Supine Piriformis Stretch Pulling Heel to Hip - 1-2 x daily - 7 x weekly - 1 sets - 23 reps - 30 sec hold Supine Bridge with Mini Swiss Ball Between Knees - 1 x daily - 7 x weekly - 3 sets - 10 reps Single Leg Bridge - 1 x daily - 7 x weekly - 3 sets - 10 reps Quadruped Rocking Slow - 1 x daily - 7 x weekly - 3 sets - 10 reps Quadruped Forearm Plank with a Mule Kick - 1 x daily  - 7 x weekly - 3 sets - 10 reps Quadruped Hip Abduction with Resistance Loop - 1 x daily - 7 x weekly - 3 sets - 10 reps  Access Code: [email or web address] modified 02-09-22 URL: [email or web address] Date: 02/09/2022 Prepared by: Voncille Lo   Standing Hip Hinge with Dowel - 1 x daily - 7 x weekly - 10 reps - 3 sets Goblet Squat with Kettlebell - 1 x daily - 7 x weekly - 10 reps - 3 sets Kettlebell Deadlift - 1 x daily - 7 x weekly - 3 sets Farmer's Carry with Kettlebells - 1 x daily - 7  x weekly - 2 sets - 8 reps      ASSESSMENT:   CLINICAL IMPRESSION: Pt reports one week ago was cleaning refrigerator and had to lay down due to back pain. She was able to calm down pain with her stretches. Continued with strengthening focus and able to back squat 95# today with good tolerance.  Will monitor for SI pain or irritation in subsequent visits. Discussed body mechanics with household chores.  Will continue toward completion of goals. Patient will benefit from skilled PT to address above impairments and improve overall function and decrease pain in back/S       GOALS: Goals reviewed with patient? Yes   SHORT TERM GOALS:   STG Name Target Date Goal status  1 Pt will be independent with initial HEP Baseline: need HEP 02/09/2022 ACHIEVED  2 Sit and stand with RT=LT wt bearing to reduce lumbar/sacral strain and allow for increased tolerance for these positions for work tasks Baseline: Wt bears to Right when standing 02-09-22 even wt bearing pt explains she is aware at work and at home getting off the chair 02/09/2022 ACHIEVED  3 Pt will be able to perform transfer movements without exacerbating pain Baseline:Pt increases pain with supine to sit and transfer out of car up to 9/10 pain 02-09-22 demo proper form 02/09/2022 ACHIEVED    LONG TERM GOALS:    LTG Name Target Date Goal status  1 Pt will be independent with advanced HEP in order to continue exercise with weights at a  gym Baseline: Pt unable to exercise due to Left SI pain and bil LB pain 03/02/2022 ongoing  2 Pt will not wake due to pain while turning in bed while sleeping at night Baseline: Pt able to sleep through the night without waking due to pain 03/02/2022 ACHIEVED  3 FOTO will improve from 52%   to 74%    indicating improved functional mobility.  Baseline:02-21-22 94% 03/02/2022 ACHIEVED  4 Pt will be able to lift 25 # from floor in order to handle heavy groceries at home Baseline: working on deadlift  able to do 65 # x 5 02-09-22 03/02/2022 ACHIEVED  5 Pt will be able to negotiate steps without exacerbating pain Baseline:Pt avoids steps due to L SI jt pain 02-14-22 no pain alternating legs 03/02/2022 ACHIEVED  6 Pt will be able to perform household chores with proper body mechanics to decrease irritation of L SI and increase lumbar/sacral stability Baseline: Feels pull when cleaning out refrigerator or cleaning tub  02-21-22 03/02/2022 ongoing    PLAN: PT FREQUENCY: 2x/week   PT DURATION: 6 weeks   PLANNED INTERVENTIONS: Therapeutic exercises, Therapeutic activity, Neuro Muscular re-education, Balance training, Gait training, Patient/Family education, Joint mobilization, Stair training, Dry Needling, Electrical stimulation, Cryotherapy, Moist heat, Taping, Ionotophoresis 11m/ml Dexamethasone, Manual therapy, and Lumbar/Sacral Stabilization   PLAN FOR NEXT SESSION:  how was body mechanics over the weekend? hamstring on physio ball, thruster with weight on edge of mat, review  lift bar / back squat.  Deadlift next session max it out      JHessie Diener PTA 02/24/22 8:14 AM Phone: [phone]: [phone]

## 2022-02-28 ENCOUNTER — Ambulatory Visit: Payer: 59 | Admitting: Physical Therapy

## 2022-03-01 NOTE — Therapy (Deleted)
?OUTPATIENT PHYSICAL THERAPY TREATMENT NOTE ? ? ?Patient Name: Karen Burnett ?MRN: [id number] ?DOB:02-24-1996, 26 y.o., female ?Today's Date: 03/01/2022 ? ?PCP: Rise Patience, DO ?REFERRING PROVIDER: Rise Patience, DO ? ? ? ? ? ? ? ? ?Past Medical History:  ?Diagnosis Date  ? Anxiety   ? Ultrasound recheck of fetal pyelectasis, antepartum 05/24/2017  ? Vaginal Pap smear, abnormal 10/2016  ? ASCUS, HPV neg so needs routine screen  ? ?Past Surgical History:  ?Procedure Laterality Date  ? NO PAST SURGERIES    ? ?Patient Active Problem List  ? Diagnosis Date Noted  ? SI (sacroiliac) joint dysfunction 01/09/2022  ? Upper back pain 03/11/2021  ? Overweight 12/19/2018  ? Generalized anxiety disorder 01/02/2017  ? Family history of diabetes mellitus 10/01/2015  ? Nexplanon insertion 06/24/2015  ? ? ?REFERRING DIAG: M53.3 (ICD-10-CM) - SI (sacroiliac) joint dysfunction ? ?THERAPY DIAG:  ?No diagnosis found. ? ?PERTINENT HISTORY: SI problems since high school, Nothing remarkable ? ?PRECAUTIONS: None ? ?SUBJECTIVE:  *** ? ?PAIN:  ?Are you having pain? no ?NPRS scale: 0/10 at 5th visit  at worst 7/10  Pt with pulling but no pain ?Pain location: Left SI  ?Pain orientation: Other: R>L pubic pain,  L SI    ?PAIN TYPE: acute on chronic ?Pain description: intermittent, sharp, stabbing, and shooting   ?Aggravating factors: bending over, picking up kids and groceries, sweeping and mopping going up and  down stairs ?Relieving factors: not moving, lying down ? ? ? ?OBJECTIVE:  ?  ?DIAGNOSTIC FINDINGS:  ?none ?  ?PATIENT SURVEYS:  ?FOTO 52% predicted 74% ?02-21-22 94% Goal achieved ?  ?SCREENING FOR RED FLAGS: ?Bowel or bladder incontinence: No ?Spinal tumors: No ?Cauda equina syndrome: No ?Compression fracture: No ?Abdominal aneurysm: No ?  ?COGNITION: ?         Overall cognitive status: Within functional limits for tasks assessed              ?          ?SENSATION: ?         Light touch: Appears intact ?         Stereognosis: Appears  intact ?         Hot/Cold: Appears intact ?         Proprioception: Appears intact ?  ?MUSCLE LENGTH: ?Hamstrings: Right 75 deg; Left 60 deg pain in Left SI ?Thomas test: WNL ?  ?POSTURE:  ?         Left SI more posterior than R anteriorly rotated. ?Slightly elevated R pelvic level. Left Sacral rotaion ?R pubic bone more superior and anterior ?2-3/23 R SI more posterior  ?  ?PALPATION: ?TTP on on  L SI and Left QL. Left SI Gil[place] test L doesn't move , pain ful pubic symphysis ?  ?LUMBARAROM/PROM ?  ?A/PROM A/PROM  ?01/19/2022  ?Flexion Can touch ankle bil Pain on returning to upright  ?Extension WNL  ?Right lateral flexion 2 in finger tip below knee jt line P with returning to upright posture  ?Left lateral flexion 2.5 finger tip to below knee jt line  ?Right rotation WNL  ?Left rotation WNL  ? (Blank rows = not tested) ?  ?LE AROM/PROM: ?                              AROM WNL throughout LE ?A/PROM Right ?01/19/2022 Left ?01/19/2022  ?Hip flexion 123 117 pain on L SI  ?  Hip extension      ?Hip abduction      ?Hip adduction      ?Hip internal rotation      ?Hip external rotation      ?Knee flexion      ?Knee extension      ?Ankle dorsiflexion      ?Ankle plantarflexion      ?Ankle inversion      ?Ankle eversion      ? (Blank rows = not tested) ?  ?LE MMT: ?                    Left pain in SI with any movement ?MMT Right ?01/19/2022 Left ?01/19/2022  ?Hip flexion 5/5 4/5 pain in Left SI  ?Hip extension 4+/5 4+/5  ?Hip abduction 4+/5 4/5  ?Hip adduction      ?Hip internal rotation      ?Hip external rotation      ?Knee flexion 5/5 5/5  ?Knee extension 5/5 5/5  ?Ankle dorsiflexion 5/5 5/5  ?Ankle plantarflexion 5/5 5/5  ?Ankle inversion      ?Ankle eversion      ? (Blank rows = not tested) ?  ?LUMBAR SPECIAL TESTS:  ?Straight leg raise test: Positive, Slump test: Negative, SI Compression/distraction test: Positive, and FABER test: Positive ?  ?FUNCTIONAL TESTS:  ?Squat pt unable to squat fully wt bears to R ?  ?GAIT: ?Distance  walked: 150 ?Assistive device utilized: None ?Level of assistance: Complete Independence ?Comments: Pt walks slowly but normally to not exacerbate L SI pain ?  ?  ?  ?TODAY'S TREATMENT  ? ?[facility] Adult PT Treatment:                                                DATE: 02-23-22 ?Therapeutic Exercise: ?*** ?Manual Therapy: ?*** ?Neuromuscular re-ed: ?*** ?Therapeutic Activity: ?*** ?Modalities: ?*** ?Self Care: ?***  ?[facility] Adult PT Treatment:                                                DATE: 02-21-22 ?Therapeutic Exercise: ?Elliptical 8 minutes 20 sec fast and then 40 sec ?Back squat with lift bar 45#  x 5 then with 75# x 6,  , then with 85#  5 x 2 ?Leg press cybex 120# 3 x 10 ?Cybex  hip extension with 50 lb 2 x 10, R and L ?Cybex hip abduction knee/hip flex 80 degrees 2 x 10 R and L ? Box squat  14 inch from ground 45#  DB  3 x 10 ? ?[facility] Adult PT Treatment:                                                DATE: 02-16-22 ?Therapeutic Exercise: ? ?Elliptical 8 minutes RPE 5/6 ?Banded SLS clams with BTB 3 x 10 ?Ardine Eng pose forward and side to side 30 sec x 4 ?SL bridge off edge of mat with VC for head position 2 x 10 R and L with rest between sets ?Standing quad set 30 sec x 2 R and L each ?Physioball  crunch -roll up thighs x 20 ?Side plank   from knees on L  3 x 30 sec and then on R 3 x 30sec  weaker on L ?Front plank  on elbow 3 x 30 sec ?Goblet squat 30 # 1 x 8  then did 2 more ?Bird dog x 10 each side - on airex  ?Piriformis stretch bilat ?Figure 4 stretch  bilat ? ? ? ?[facility] Adult PT Treatment:                                                DATE: 02/14/22 ?Therapeutic Exercise: ? ?Elliptical 8 minutes RPE 5/6 ?AMRAP ( as many reps as possible ) 3 rounds ?      Goblet squat 25 # Round #1 26 reps, round #2 27 reps ?       Lat pull blue t band Round #1 R 10, L 10;Round#2 R 13reps L13reps ?       Reverse lunge 10 # Round #1 R 15 L 14 Round#2 R 11, L 10 ?       Cybex hip ext  # 25 #  Round #1 R 12 L14 Round#2  R 15, L 15  reps ? ? ? ?[facility] Adult PT Treatment:                                                DATE: 02-09-22 ?Therapeutic Exercise: ? ? ?Standing Hip Hinge with Dowel - x 15 to demo and then hip hinge against wall x 15 as external cue ?Goblet Squat with Kettlebell 2 x10 25# to box 1 x 10 ?Kettlebell Deadlift initial 15 # KB 2 x 5, 25# KB 2 x 8,  45 # KB 1 x 8, 65 lift bar 1 x 5 reps VC and TC for each lift ?Farmer's Carry with Kettlebells 2 x carrying R hadn 25 # KB and L hand 15 # KB ?Physioball crunch -roll up thighs x 20 ?Bridge with ankles on  red physio ballball 5 sec x 10  ?Heels on physioball- knee flexion with abdominal focus x 20 - (able to do 3 then  rest until 20 total due to fatigue ?Blue band hooklying clam ?Blue band bridge with clams during lift 3 rounds of 10 ?Alternating bent knee fall out with blue band ?Bridge with ball squeeze-arms crossed x 10 ?Bird dog x 10- on airex  ?Sit-stand blue band  at knees 3 x 10 with 10# dumbbell at chest ?Piriformis stretch bilat ?Figure 4 stretch  bilat ?Double knee to chest  ? ?[facility] Adult PT Treatment:                                                DATE: 02-02-22 ? ?Hips aligned at start of session: no MET needed ? ?Therapeutic Exercise:  ?Physioball crunch -roll up thighs x 20 ?Bridge with ankles on ball 5 sec x 10  ?Heels on physioball- knee flexion with abdominal focus x 20 -  ?Blue band hooklying clam ?Blue band bridge with clams during lift 3 rounds of 10 ?Alternating bent knee  fall out with blue band ?Bridge with ball squeeze-arms crossed x 10 ?Bird dog x 10- on airex  ?Sit-stand blue band  at knees 3 x 10 with 10# dumbbell at chest ?Piriformis stretch bilat ?Figure 4 stretch  bilat ?Double knee to chest  ?  ? ? ? ? ? ? ?  ?PATIENT EDUCATION:  ?Education details: POC  went over FOTO report, given HEP (basic Pre Pilates) updated HEP for strengthening and stretching, added AMRAP and EMOM to HEP ?Person educated: Patient ?Education method: Explanation, Demonstration,  Tactile cues, Verbal cues, and Handouts ?Education comprehension: verbalized understanding, returned demonstration, verbal cues required, tactile cues required, and needs further education ?  ?  ?[place]

## 2022-03-01 NOTE — Therapy (Incomplete Revision)
?OUTPATIENT PHYSICAL THERAPY TREATMENT NOTE/DISCHARGE NOTE ? ? ?Patient Name: Karen Burnett ?MRN: [id number] ?DOB:1996/12/14, 26 y.o., female ?Today's Date: 03/02/2022 ?PHYSICAL THERAPY DISCHARGE SUMMARY ? ?Visits from Start of Care: 10 ? ?Current functional level related to goals / functional outcomes: ?As below ?  ?Remaining deficits: ?none ?  ?Education / Equipment: ?HEP and Gym HEP  ? ?Patient agrees to discharge. Patient goals were met. Patient is being discharged due to being pleased with the current functional level.  ?PCP: Rise Patience, DO ?REFERRING PROVIDER: Rise Patience, DO ? ? PT End of Session - 03/02/22 1502   ? ? Visit Number 10   ? Number of Visits 12   ? Date for PT Re-Evaluation 03/02/22   ? Authorization Type UMR   ? PT Start Time 1500   ? PT Stop Time 8546   ? PT Time Calculation (min) 45 min   ? Activity Tolerance Patient tolerated treatment well   ? Behavior During Therapy [facility] for tasks assessed/performed   ? ?  ?  ? ?  ? ? ? ? ? ? ? ?Past Medical History:  ?Diagnosis Date  ? Anxiety   ? Ultrasound recheck of fetal pyelectasis, antepartum 05/24/2017  ? Vaginal Pap smear, abnormal 10/2016  ? ASCUS, HPV neg so needs routine screen  ? ?Past Surgical History:  ?Procedure Laterality Date  ? NO PAST SURGERIES    ? ?Patient Active Problem List  ? Diagnosis Date Noted  ? SI (sacroiliac) joint dysfunction 01/09/2022  ? Upper back pain 03/11/2021  ? Overweight 12/19/2018  ? Generalized anxiety disorder 01/02/2017  ? Family history of diabetes mellitus 10/01/2015  ? Nexplanon insertion 06/24/2015  ? ? ?REFERRING DIAG: M53.3 (ICD-10-CM) - SI (sacroiliac) joint dysfunction ? ?THERAPY DIAG:  ?Acute right-sided low back pain with sciatica, sciatica laterality unspecified ? ?Muscle weakness (generalized) ? ?Difficulty in walking, not elsewhere classified ? ?PERTINENT HISTORY: SI problems since high school, Nothing remarkable ? ?PRECAUTIONS: None ? ?SUBJECTIVE:  I feel great. I am going to the gym with my  husband.  I don't have any pain. ? ?PAIN:  ?Are you having pain? no ?NPRS scale: 0/10 at 5th visit  at worst 7/10  Pt with pulling but no pain ?Pain location: Left SI  ?Pain orientation: Other: R>L pubic pain,  L SI    ?PAIN TYPE: acute on chronic ?Pain description: intermittent, sharp, stabbing, and shooting   ?Aggravating factors: bending over, picking up kids and groceries, sweeping and mopping going up and  down stairs ?Relieving factors: not moving, lying down ? ? ? ?OBJECTIVE:  ?  ?DIAGNOSTIC FINDINGS:  ?none ?  ?PATIENT SURVEYS:  ?FOTO 52% predicted 74% ?02-21-22 94% Goal achieved ?  ?SCREENING FOR RED FLAGS: ?Bowel or bladder incontinence: No ?Spinal tumors: No ?Cauda equina syndrome: No ?Compression fracture: No ?Abdominal aneurysm: No ?  ?COGNITION: ?         Overall cognitive status: Within functional limits for tasks assessed              ?          ?SENSATION: ?         Light touch: Appears intact ?         Stereognosis: Appears intact ?         Hot/Cold: Appears intact ?         Proprioception: Appears intact ?  ?MUSCLE LENGTH: ?Hamstrings: Right 75 deg; Left 60 deg pain in Left SI ?Thomas test: WNL ?  ?POSTURE:  ?  Left SI more posterior than R anteriorly rotated. ?Slightly elevated R pelvic level. Left Sacral rotaion ?R pubic bone more superior and anterior ?2-3/23 R SI more posterior  ?  ?PALPATION: ?TTP on on  L SI and Left QL. Left SI [place] test L doesn't move , pain ful pubic symphysis ?  ?LUMBARAROM/PROM ?  ?A/PROM A/PROM  ?01/19/2022 A/PROM  ?Flexion Can touch ankle bil Pain on returning to upright   ?Extension WNL   ?Right lateral flexion 2 in finger tip below knee jt line P with returning to upright posture   ?Left lateral flexion 2.5 finger tip to below knee jt line   ?Right rotation WNL   ?Left rotation WNL   ? (Blank rows = not tested) ?  ?LE AROM/PROM: ?                              AROM WNL throughout LE ?A/PROM Right ?01/19/2022 Left ?01/19/2022  ?Hip flexion 123 117 pain on L SI   ?Hip extension      ?Hip abduction      ?Hip adduction      ?Hip internal rotation      ?Hip external rotation      ?Knee flexion      ?Knee extension      ?Ankle dorsiflexion      ?Ankle plantarflexion      ?Ankle inversion      ?Ankle eversion      ? (Blank rows = not tested) ?  ?LE MMT: ?                    Left pain in SI with any movement ?MMT Right ?01/19/2022 Left ?01/19/2022 Right ?03-02-22 Left ?03-02-22  ?Hip flexion 5/5 4/5 pain in Left SI 5 5  ?Hip extension 4+/5 4+/_0 ?Hip abduction 4+/5 4/_1 ?Hip adduction        ?Hip internal rotation        ?Hip external rotation        ?Knee flexion 5/5 5/_2 ?Knee extension 5/5 5/_3 ?Ankle dorsiflexion 5/5 5/_4 ?Ankle plantarflexion 5/5 5/_5 ?Ankle inversion        ?Ankle eversion        ? (Blank rows = not tested) ?  ?LUMBAR SPECIAL TESTS:  ?Straight leg raise test: Positive, Slump test: Negative, SI Compression/distraction test: Positive, and FABER test: Positive ?  ?FUNCTIONAL TESTS:  ?Squat pt unable to squat fully wt bears to R ?  ?GAIT: ?Distance walked: 150 ?Assistive device utilized: None ?Level of assistance: Complete Independence ?Comments: Pt walks slowly but normally to not exacerbate L SI pain ?  ?  ?  ?TODAY'S TREATMENT  ? ?[facility] Adult PT Treatment:                                                DATE: 03-02-22 ?Therapeutic Exercise: using gym program and orienting for 3 separate days but going over in clinic ? ?Elliptical 8 min RPE 5/6 ?Back Squat  5 x 3  95# ?Split squat 3 x 10 each side 15# DB ?SUPERSET ?Hamstring Curl 3 x 12  BW ?Narrow Squat 3 x 12 15 # ? ?Day 2  ?Deadlift  4 x 2   125 ?Good morning 3 x 8-10 35 # ?SUPERSET ?Weighted Bridge 1 x 12 ?Lateral Step up 3 x 8 BW ?Hollow Hold 3 x 30 sec BW ? ?Day 3  ?Push Press 4 x 5 25 lb bar ?Lat pull over 3 x 10 red power band ?TRX row 1 set until 2 reps in reserve ?SUPERSET ?Banded Curls 1 x 10 red power band ?Banded Triceps 1 x10 red power band ?Banded Pull Aparts 1 x 10 red power  band ?[facility] Adult PT Treatment:                                                DATE: 02-21-22 ?Therapeutic Exercise: ?Elliptical 8 minutes 20 sec fast and then 40 sec ?Back squat with lift bar 45#  x 5 then with 75# x 6,  , then with 85#  5 x 2 ?Leg press cybex 120# 3 x 10 ?Cybex  hip extension with 50 lb 2 x 10, R and L ?Cybex hip abduction knee/hip flex 80 degrees 2 x 10 R and L ? Box squat  14 inch from ground 45#  DB  3 x 10 ? ?[facility] Adult PT Treatment:                                                DATE: 02-16-22 ?Therapeutic Exercise: ? ?Elliptical 8 minutes RPE 5/6 ?Banded SLS clams with BTB 3 x 10 ?Ardine Eng pose forward and side to side 30 sec x 4 ?SL bridge off edge of mat with VC for head position 2 x 10 R and L with rest between sets ?Standing quad set 30 sec x 2 R and L each ?Physioball crunch -roll up thighs x 20 ?Side plank   from knees on L  3 x 30 sec and then on R 3 x 30sec  weaker on L ?Front plank  on elbow 3 x 30 sec ?Goblet squat 30 # 1 x 8  then did 2 more ?Bird dog x 10 each side - on airex  ?Piriformis stretch bilat ?Figure 4 stretch  bilat ? ? ? ?[facility] Adult PT Treatment:                                                DATE: 02/14/22 ?Therapeutic Exercise: ? ?Elliptical 8 minutes RPE 5/6 ?AMRAP ( as many reps as possible ) 3 rounds ?      Goblet squat 25 # Round #1 26 reps, round #2 27 reps ?       Lat pull blue t band Round #1 R 10, L 10;Round#2 R 13reps L13reps ?       Reverse lunge 10 # Round #1 R 15 L 14 Round#2 R 11, L 10 ?       Cybex hip ext  # 25 #  Round #1 R 12 L14 Round#2  R 15, L 15 reps ? ? ? ?[facility] Adult PT Treatment:  DATE: 02-09-22 ?Therapeutic Exercise: ? ? ?Standing Hip Hinge with Dowel - x 15 to demo and then hip hinge against wall x 15 as external cue ?Goblet Squat with Kettlebell 2 x10 25# to box 1 x 10 ?Kettlebell Deadlift initial 15 # KB 2 x 5, 25# KB 2 x 8,  45 # KB 1 x 8, 65 lift bar 1 x 5 reps VC and TC for each lift ?Farmer's Carry  with Kettlebells 2 x carrying R hadn 25 # KB and L hand 15 # KB ?Physioball crunch -roll up thighs x 20 ?Bridge with ankles on  red physio ballball 5 sec x 10  ?Heels on physioball- knee flexion with abdominal f

## 2022-03-01 NOTE — Therapy (Signed)
?OUTPATIENT PHYSICAL THERAPY TREATMENT NOTE/DISCHARGE NOTE ? ? ?Patient Name: Karen Burnett ?MRN: [id number] ?DOB:1996/12/14, 26 y.o., female ?Today's Date: 03/02/2022 ?PHYSICAL THERAPY DISCHARGE SUMMARY ? ?Visits from Start of Care: 10 ? ?Current functional level related to goals / functional outcomes: ?As below ?  ?Remaining deficits: ?none ?  ?Education / Equipment: ?HEP and Gym HEP  ? ?Patient agrees to discharge. Patient goals were met. Patient is being discharged due to being pleased with the current functional level.  ?PCP: Rise Patience, DO ?REFERRING PROVIDER: Rise Patience, DO ? ? PT End of Session - 03/02/22 1502   ? ? Visit Number 10   ? Number of Visits 12   ? Date for PT Re-Evaluation 03/02/22   ? Authorization Type UMR   ? PT Start Time 1500   ? PT Stop Time 8546   ? PT Time Calculation (min) 45 min   ? Activity Tolerance Patient tolerated treatment well   ? Behavior During Therapy [facility] for tasks assessed/performed   ? ?  ?  ? ?  ? ? ? ? ? ? ? ?Past Medical History:  ?Diagnosis Date  ? Anxiety   ? Ultrasound recheck of fetal pyelectasis, antepartum 05/24/2017  ? Vaginal Pap smear, abnormal 10/2016  ? ASCUS, HPV neg so needs routine screen  ? ?Past Surgical History:  ?Procedure Laterality Date  ? NO PAST SURGERIES    ? ?Patient Active Problem List  ? Diagnosis Date Noted  ? SI (sacroiliac) joint dysfunction 01/09/2022  ? Upper back pain 03/11/2021  ? Overweight 12/19/2018  ? Generalized anxiety disorder 01/02/2017  ? Family history of diabetes mellitus 10/01/2015  ? Nexplanon insertion 06/24/2015  ? ? ?REFERRING DIAG: M53.3 (ICD-10-CM) - SI (sacroiliac) joint dysfunction ? ?THERAPY DIAG:  ?Acute right-sided low back pain with sciatica, sciatica laterality unspecified ? ?Muscle weakness (generalized) ? ?Difficulty in walking, not elsewhere classified ? ?PERTINENT HISTORY: SI problems since high school, Nothing remarkable ? ?PRECAUTIONS: None ? ?SUBJECTIVE:  I feel great. I am going to the gym with my  husband.  I don't have any pain. ? ?PAIN:  ?Are you having pain? no ?NPRS scale: 0/10 at 5th visit  at worst 7/10  Pt with pulling but no pain ?Pain location: Left SI  ?Pain orientation: Other: R>L pubic pain,  L SI    ?PAIN TYPE: acute on chronic ?Pain description: intermittent, sharp, stabbing, and shooting   ?Aggravating factors: bending over, picking up kids and groceries, sweeping and mopping going up and  down stairs ?Relieving factors: not moving, lying down ? ? ? ?OBJECTIVE:  ?  ?DIAGNOSTIC FINDINGS:  ?none ?  ?PATIENT SURVEYS:  ?FOTO 52% predicted 74% ?02-21-22 94% Goal achieved ?  ?SCREENING FOR RED FLAGS: ?Bowel or bladder incontinence: No ?Spinal tumors: No ?Cauda equina syndrome: No ?Compression fracture: No ?Abdominal aneurysm: No ?  ?COGNITION: ?         Overall cognitive status: Within functional limits for tasks assessed              ?          ?SENSATION: ?         Light touch: Appears intact ?         Stereognosis: Appears intact ?         Hot/Cold: Appears intact ?         Proprioception: Appears intact ?  ?MUSCLE LENGTH: ?Hamstrings: Right 75 deg; Left 60 deg pain in Left SI ?Thomas test: WNL ?  ?POSTURE:  ?  Left SI more posterior than R anteriorly rotated. ?Slightly elevated R pelvic level. Left Sacral rotaion ?R pubic bone more superior and anterior ?2-3/23 R SI more posterior  ?  ?PALPATION: ?TTP on on  L SI and Left QL. Left SI [place] test L doesn't move , pain ful pubic symphysis ?  ?LUMBARAROM/PROM ?  ?A/PROM A/PROM  ?01/19/2022 A/PROM  ?Flexion Can touch ankle bil Pain on returning to upright   ?Extension WNL   ?Right lateral flexion 2 in finger tip below knee jt line P with returning to upright posture   ?Left lateral flexion 2.5 finger tip to below knee jt line   ?Right rotation WNL   ?Left rotation WNL   ? (Blank rows = not tested) ?  ?LE AROM/PROM: ?                              AROM WNL throughout LE ?A/PROM Right ?01/19/2022 Left ?01/19/2022  ?Hip flexion 123 117 pain on L SI   ?Hip extension      ?Hip abduction      ?Hip adduction      ?Hip internal rotation      ?Hip external rotation      ?Knee flexion      ?Knee extension      ?Ankle dorsiflexion      ?Ankle plantarflexion      ?Ankle inversion      ?Ankle eversion      ? (Blank rows = not tested) ?  ?LE MMT: ?                    Left pain in SI with any movement ?MMT Right ?01/19/2022 Left ?01/19/2022 Right ?03-02-22 Left ?03-02-22  ?Hip flexion 5/5 4/5 pain in Left SI 5 5  ?Hip extension 4+/5 4+/_0 ?Hip abduction 4+/5 4/_1 ?Hip adduction        ?Hip internal rotation        ?Hip external rotation        ?Knee flexion 5/5 5/_2 ?Knee extension 5/5 5/_3 ?Ankle dorsiflexion 5/5 5/_4 ?Ankle plantarflexion 5/5 5/_5 ?Ankle inversion        ?Ankle eversion        ? (Blank rows = not tested) ?  ?LUMBAR SPECIAL TESTS:  ?Straight leg raise test: Positive, Slump test: Negative, SI Compression/distraction test: Positive, and FABER test: Positive ?  ?FUNCTIONAL TESTS:  ?Squat pt unable to squat fully wt bears to R ?  ?GAIT: ?Distance walked: 150 ?Assistive device utilized: None ?Level of assistance: Complete Independence ?Comments: Pt walks slowly but normally to not exacerbate L SI pain ?  ?  ?  ?TODAY'S TREATMENT  ? ?[facility] Adult PT Treatment:                                                DATE: 03-02-22 ?Therapeutic Exercise: using gym program and orienting for 3 separate days but going over in clinic ? ?Elliptical 8 min RPE 5/6 ?Back Squat  5 x 3  95# ?Split squat 3 x 10 each side 15# DB ?SUPERSET ?Hamstring Curl 3 x 12  BW ?Narrow Squat 3 x 12 15 # ? ?Day 2  ?Deadlift  4 x 2   125 ?Good morning 3 x 8-10 35 # ?SUPERSET ?Weighted Bridge 1 x 12 ?Lateral Step up 3 x 8 BW ?Hollow Hold 3 x 30 sec BW ? ?Day 3  ?Push Press 4 x 5 25 lb bar ?Lat pull over 3 x 10 red power band ?TRX row 1 set until 2 reps in reserve ?SUPERSET ?Banded Curls 1 x 10 red power band ?Banded Triceps 1 x10 red power band ?Banded Pull Aparts 1 x 10 red power  band ?[facility] Adult PT Treatment:                                                DATE: 02-21-22 ?Therapeutic Exercise: ?Elliptical 8 minutes 20 sec fast and then 40 sec ?Back squat with lift bar 45#  x 5 then with 75# x 6,  , then with 85#  5 x 2 ?Leg press cybex 120# 3 x 10 ?Cybex  hip extension with 50 lb 2 x 10, R and L ?Cybex hip abduction knee/hip flex 80 degrees 2 x 10 R and L ? Box squat  14 inch from ground 45#  DB  3 x 10 ? ?[facility] Adult PT Treatment:                                                DATE: 02-16-22 ?Therapeutic Exercise: ? ?Elliptical 8 minutes RPE 5/6 ?Banded SLS clams with BTB 3 x 10 ?Ardine Eng pose forward and side to side 30 sec x 4 ?SL bridge off edge of mat with VC for head position 2 x 10 R and L with rest between sets ?Standing quad set 30 sec x 2 R and L each ?Physioball crunch -roll up thighs x 20 ?Side plank   from knees on L  3 x 30 sec and then on R 3 x 30sec  weaker on L ?Front plank  on elbow 3 x 30 sec ?Goblet squat 30 # 1 x 8  then did 2 more ?Bird dog x 10 each side - on airex  ?Piriformis stretch bilat ?Figure 4 stretch  bilat ? ? ? ?[facility] Adult PT Treatment:                                                DATE: 02/14/22 ?Therapeutic Exercise: ? ?Elliptical 8 minutes RPE 5/6 ?AMRAP ( as many reps as possible ) 3 rounds ?      Goblet squat 25 # Round #1 26 reps, round #2 27 reps ?       Lat pull blue t band Round #1 R 10, L 10;Round#2 R 13reps L13reps ?       Reverse lunge 10 # Round #1 R 15 L 14 Round#2 R 11, L 10 ?       Cybex hip ext  # 25 #  Round #1 R 12 L14 Round#2  R 15, L 15 reps ? ? ? ?[facility] Adult PT Treatment:  DATE: 02-09-22 ?Therapeutic Exercise: ? ? ?Standing Hip Hinge with Dowel - x 15 to demo and then hip hinge against wall x 15 as external cue ?Goblet Squat with Kettlebell 2 x10 25# to box 1 x 10 ?Kettlebell Deadlift initial 15 # KB 2 x 5, 25# KB 2 x 8,  45 # KB 1 x 8, 65 lift bar 1 x 5 reps VC and TC for each lift ?Farmer's Carry  with Kettlebells 2 x carrying R hadn 25 # KB and L hand 15 # KB ?Physioball crunch -roll up thighs x 20 ?Bridge with ankles on  red physio ballball 5 sec x 10  ?Heels on physioball- knee flexion with abdominal f

## 2022-03-02 ENCOUNTER — Ambulatory Visit: Payer: 59 | Admitting: Physical Therapy

## 2022-03-02 ENCOUNTER — Encounter: Payer: Self-pay | Admitting: Physical Therapy

## 2022-03-02 ENCOUNTER — Other Ambulatory Visit: Payer: Self-pay

## 2022-03-02 DIAGNOSIS — M5440 Lumbago with sciatica, unspecified side: Secondary | ICD-10-CM | POA: Diagnosis not present

## 2022-03-02 DIAGNOSIS — R262 Difficulty in walking, not elsewhere classified: Secondary | ICD-10-CM | POA: Diagnosis not present

## 2022-03-02 DIAGNOSIS — M6281 Muscle weakness (generalized): Secondary | ICD-10-CM | POA: Diagnosis not present

## 2022-03-02 NOTE — Patient Instructions (Addendum)
GYM WORKOUT SCHEDULE ? ?Day 1 ?Back Squat  5 x 3  95# ?Split squat 3 x 10 each side 12# DB ?SUPERSET ?Hamstring Curl 3 x 12  BW ?Narrow Squat 3 x 12 15 # ? ?Day 2  ?Deadlift 5 x 3   125 ?Good morning 3 x 8-10  weighted bar ?SUPERSET ?Weighted Bridge 3 x 12  20# ?Lateral Step up 3 x 6-8 BW ?Hollow Hold 3 x 30 sec BW ? ?Day 3  ?Push Press 4 x 5 weighted bar and then add # ?Lat pull over 3 x 10 10# ?TRX row 3 sets until 2 reps in reserve ?SUPERSET ?Banded Curls 3 x 12 ?Banded Triceps 3 x 12 ?Banded Pull Aparts 3 x 12 ? ?Garen Lah, PT, ATRIC ?Certified Exercise Expert for the Aging Adult  ?03/02/22 3:56 PM ?Phone: [phone] ?Fax: [phone]  ?

## 2022-05-09 ENCOUNTER — Ambulatory Visit (HOSPITAL_COMMUNITY)
Admission: EM | Admit: 2022-05-09 | Discharge: 2022-05-09 | Disposition: A | Discharge status: Home / Self Care | Payer: 59 | Attending: Family Medicine | Admitting: Family Medicine

## 2022-05-09 ENCOUNTER — Encounter (HOSPITAL_COMMUNITY): Payer: Self-pay

## 2022-05-09 DIAGNOSIS — H1032 Unspecified acute conjunctivitis, left eye: Secondary | ICD-10-CM | POA: Diagnosis not present

## 2022-05-09 MED ORDER — GENTAMICIN SULFATE 0.3 % OP SOLN: 2.0000 [drp] | Freq: Three times a day (TID) | OPHTHALMIC | 0 refills | Status: AC

## 2022-05-09 NOTE — ED Triage Notes (Signed)
Pt states left eye redness and crusty since this am.

## 2022-05-09 NOTE — ED Provider Notes (Signed)
[facility]    CSN: [id number] Arrival date & time: 05/09/22  1309      History   Chief Complaint Chief Complaint  Patient presents with   Conjunctivitis    HPI Karen Burnett is a 26 y.o. female.    Conjunctivitis  Here for irritation in her left eye that began this morning.  It also has become red and she has crusting and discharge.  She wonders if her family dog licking her in the eyes might have caused this.  No abrasion or injury to the eye.  Has the nexplanon  Past Medical History:  Diagnosis Date   Anxiety    Ultrasound recheck of fetal pyelectasis, antepartum 05/24/2017   Vaginal Pap smear, abnormal 10/2016   ASCUS, HPV neg so needs routine screen    Patient Active Problem List   Diagnosis Date Noted   SI (sacroiliac) joint dysfunction 01/09/2022   Upper back pain 03/11/2021   Overweight 12/19/2018   Generalized anxiety disorder 01/02/2017   Family history of diabetes mellitus 10/01/2015   Nexplanon insertion 06/24/2015    Past Surgical History:  Procedure Laterality Date   NO PAST SURGERIES      OB History     Gravida  1   Para  1   Term  1   Preterm  0   AB  0   Living  1      SAB  0   IAB  0   Ectopic  0   Multiple  0   Live Births  1            Home Medications    Prior to Admission medications   Medication Sig Start Date End Date Taking? Authorizing Provider  gentamicin (GARAMYCIN) 0.3 % ophthalmic solution Place 2 drops into the left eye 3 (three) times daily for 5 days. 05/09/22 05/14/22 Yes Yui Mulvaney, Gwenlyn Perking, MD  albuterol (PROVENTIL HFA;VENTOLIN HFA) 108 (90 Base) MCG/ACT inhaler Inhale 2 puffs into the lungs every 4 (four) hours as needed for wheezing or shortness of breath. Patient not taking: Reported on 01/19/2022 01/30/19   Matilde Haymaker, MD  amoxicillin-clavulanate (AUGMENTIN) 875-125 MG tablet Take 1 tablet by mouth every 12 (twelve) hours. Patient not taking: Reported on 01/19/2022 12/13/20   Volney American, PA-C  COVID-19 At Home Antigen Test (QUICKVUE AT-HOME COVID-19 TEST) KIT USE AS DIRECTED WITHIN PACKAGE INSTRUCTIONS. 08/08/21   Jefm Bryant, RPH  lidocaine (XYLOCAINE) 2 % solution Use as directed 10 mLs in the mouth or throat as needed for mouth pain. Patient not taking: Reported on 01/19/2022 12/13/20   Volney American, PA-C  pantoprazole (PROTONIX) 20 MG tablet TAKE 1 TABLET BY MOUTH EVERY DAY 04/28/19 11/22/20  Matilde Haymaker, MD  sertraline (ZOLOFT) 100 MG tablet Take 1.5 tablets (150 mg total) by mouth daily. 01/05/20 11/22/20  Matilde Haymaker, MD    Family History Family History  Problem Relation Age of Onset   Thyroid disease Mother    Diabetes Father    Arthritis Father    Psoriasis Father    Psoriasis Brother    Diabetes Maternal Aunt    Diabetes Maternal Uncle    Diabetes Paternal Aunt    Diabetes Paternal Uncle    Diabetes Maternal Grandmother    Diabetes Maternal Grandfather    Hypertension Maternal Grandfather    Diabetes Paternal Grandmother    Diabetes Paternal Grandfather     Social History Social History   Tobacco Use  Smoking status: Never   Smokeless tobacco: Never  Substance Use Topics   Alcohol use: No    Alcohol/week: 0.0 standard drinks   Drug use: No     Allergies   Patient has no known allergies.   Review of Systems Review of Systems   Physical Exam Triage Vital Signs ED Triage Vitals  Enc Vitals Group     BP 05/09/22 1337 123/88     Pulse Rate 05/09/22 1337 (!) 106     Resp 05/09/22 1337 18     Temp 05/09/22 1337 98.5 F (36.9 C)     Temp src --      SpO2 05/09/22 1337 98 %     Weight --      Height --      Head Circumference --      Peak Flow --      Pain Score 05/09/22 1338 2     Pain Loc --      Pain Edu? --      Excl. in [place]? --    No data found.  Updated Vital Signs BP 123/88 (BP Location: Left Arm)   Pulse (!) 106   Temp 98.5 F (36.9 C)   Resp 18   SpO2 98%   Visual Acuity Right Eye  Distance:   Left Eye Distance:   Bilateral Distance:    Right Eye Near:   Left Eye Near:    Bilateral Near:     Physical Exam Vitals reviewed.  Constitutional:      General: She is not in acute distress.    Appearance: She is not ill-appearing, toxic-appearing or diaphoretic.  HENT:     Nose: Nose normal.     Mouth/Throat:     Mouth: Mucous membranes are moist.  Eyes:     Extraocular Movements: Extraocular movements intact.     Pupils: Pupils are equal, round, and reactive to light.     Comments: Left eyes injected with some discharge in the inner canthus.  Lids are not swollen or red  Skin:    Coloration: Skin is not pale.  Neurological:     Mental Status: She is alert.     UC Treatments / Results  Labs (all labs ordered are listed, but only abnormal results are displayed) Labs Reviewed - No data to display  EKG   Radiology No results found.  Procedures Procedures (including critical care time)  Medications Ordered in UC Medications - No data to display  Initial Impression / Assessment and Plan / UC Course  I have reviewed the triage vital signs and the nursing notes.  Pertinent labs & imaging results that were available during my care of the patient were reviewed by me and considered in my medical decision making (see chart for details).     Gent for the conjunctivitis Final Clinical Impressions(s) / UC Diagnoses   Final diagnoses:  Acute conjunctivitis of left eye, unspecified acute conjunctivitis type     Discharge Instructions      Put gentamicin eyedrops in your left eye 3 times daily for 5 days  If your right eye becomes red and symptomatic, you can begin using the drops in that 1 also.  Cool compresses can be soothing     ED Prescriptions     Medication Sig Dispense Auth. Provider   gentamicin (GARAMYCIN) 0.3 % ophthalmic solution Place 2 drops into the left eye 3 (three) times daily for 5 days. 5 mL Barrett Henle, MD  PDMP not reviewed this encounter.   Barrett Henle, MD 05/09/22 1410

## 2022-05-09 NOTE — Discharge Instructions (Addendum)
Put gentamicin eyedrops in your left eye 3 times daily for 5 days  If your right eye becomes red and symptomatic, you can begin using the drops in that 1 also.  Cool compresses can be soothing

## 2022-05-13 ENCOUNTER — Ambulatory Visit
Admission: EM | Admit: 2022-05-13 | Discharge: 2022-05-13 | Disposition: A | Discharge status: Home / Self Care | Payer: 59 | Attending: Urgent Care | Admitting: Urgent Care

## 2022-05-13 DIAGNOSIS — H65191 Other acute nonsuppurative otitis media, right ear: Secondary | ICD-10-CM | POA: Diagnosis not present

## 2022-05-13 LAB — POCT RAPID STREP A (OFFICE): Rapid Strep A Screen: NEGATIVE

## 2022-05-13 MED ORDER — PSEUDOEPHEDRINE HCL 60 MG PO TABS: 60.0000 mg | ORAL_TABLET | Freq: Three times a day (TID) | ORAL | 0 refills | Status: DC | PRN

## 2022-05-13 MED ORDER — AMOXICILLIN 875 MG PO TABS: 875.0000 mg | ORAL_TABLET | Freq: Two times a day (BID) | ORAL | 0 refills | Status: DC

## 2022-05-13 MED ORDER — CETIRIZINE HCL 10 MG PO TABS: 10.0000 mg | ORAL_TABLET | Freq: Every day | ORAL | 0 refills | Status: DC

## 2022-05-13 NOTE — ED Triage Notes (Signed)
Patient c/o conjunctivitis (Tuesday) that she is currently being treated for, Wednesday she began having a cough, sore throat and congestion, and today she states she began having right ear pain and muffled hearing.

## 2022-05-13 NOTE — ED Provider Notes (Signed)
[place]   MRN: [id number] DOB: 01-19-1996  Subjective:   Karen Burnett is a 26 y.o. female presenting for 4 day history of acute onset persistent cough, sinus congestion, throat pain, right ear pain, muffled hearing, ear fullness. She is completing her treatment for conjunctivitis. No chest pain, shob, wheezing, n/v, abdominal pain.   No current facility-administered medications for this encounter.  Current Outpatient Medications:    albuterol (PROVENTIL HFA;VENTOLIN HFA) 108 (90 Base) MCG/ACT inhaler, Inhale 2 puffs into the lungs every 4 (four) hours as needed for wheezing or shortness of breath. (Patient not taking: Reported on 01/19/2022), Disp: 1 Inhaler, Rfl: 0   COVID-19 At Home Antigen Test (QUICKVUE AT-HOME COVID-19 TEST) KIT, USE AS DIRECTED WITHIN PACKAGE INSTRUCTIONS., Disp: 2 each, Rfl: 0   gentamicin (GARAMYCIN) 0.3 % ophthalmic solution, Place 2 drops into the left eye 3 (three) times daily for 5 days., Disp: 5 mL, Rfl: 0   lidocaine (XYLOCAINE) 2 % solution, Use as directed 10 mLs in the mouth or throat as needed for mouth pain. (Patient not taking: Reported on 01/19/2022), Disp: 100 mL, Rfl: 0   No Known Allergies  Past Medical History:  Diagnosis Date   Anxiety    Ultrasound recheck of fetal pyelectasis, antepartum 05/24/2017   Vaginal Pap smear, abnormal 10/2016   ASCUS, HPV neg so needs routine screen     Past Surgical History:  Procedure Laterality Date   NO PAST SURGERIES      Family History  Problem Relation Age of Onset   Thyroid disease Mother    Diabetes Father    Arthritis Father    Psoriasis Father    Psoriasis Brother    Diabetes Maternal Aunt    Diabetes Maternal Uncle    Diabetes Paternal Aunt    Diabetes Paternal Uncle    Diabetes Maternal Grandmother    Diabetes Maternal Grandfather    Hypertension Maternal Grandfather    Diabetes Paternal Grandmother    Diabetes Paternal Grandfather     Social History    Tobacco Use   Smoking status: Never   Smokeless tobacco: Never  Substance Use Topics   Alcohol use: No    Alcohol/week: 0.0 standard drinks   Drug use: No    ROS   Objective:   Vitals: BP 110/75 (BP Location: Right Arm)   Pulse 82   Temp 98.5 F (36.9 C) (Oral)   Resp 18   SpO2 96%   Physical Exam Constitutional:      General: She is not in acute distress.    Appearance: Normal appearance. She is well-developed and normal weight. She is not ill-appearing, toxic-appearing or diaphoretic.  HENT:     Head: Normocephalic and atraumatic.     Right Ear: Ear canal and external ear normal. No drainage or tenderness. A middle ear effusion is present. There is no impacted cerumen. Tympanic membrane is erythematous.     Left Ear: Tympanic membrane, ear canal and external ear normal. No drainage or tenderness.  No middle ear effusion. There is no impacted cerumen. Tympanic membrane is not erythematous.     Nose: Nose normal. No congestion or rhinorrhea.     Mouth/Throat:     Mouth: Mucous membranes are moist. No oral lesions.     Pharynx: No pharyngeal swelling, oropharyngeal exudate, posterior oropharyngeal erythema or uvula swelling.     Tonsils: No tonsillar exudate or tonsillar abscesses.  Eyes:     General: No scleral icterus.  Right eye: No discharge.        Left eye: No discharge.     Extraocular Movements: Extraocular movements intact.     Right eye: Normal extraocular motion.     Left eye: Normal extraocular motion.     Conjunctiva/sclera: Conjunctivae normal.  Cardiovascular:     Rate and Rhythm: Normal rate and regular rhythm.     Heart sounds: Normal heart sounds. No murmur heard.   No friction rub. No gallop.  Pulmonary:     Effort: Pulmonary effort is normal. No respiratory distress.     Breath sounds: No stridor. No wheezing, rhonchi or rales.  Chest:     Chest wall: No tenderness.  Musculoskeletal:     Cervical back: Normal range of motion and neck  supple.  Lymphadenopathy:     Cervical: No cervical adenopathy.  Skin:    General: Skin is warm and dry.  Neurological:     General: No focal deficit present.     Mental Status: She is alert and oriented to person, place, and time.  Psychiatric:        Mood and Affect: Mood normal.        Behavior: Behavior normal.    Results for orders placed or performed during the hospital encounter of 05/13/22 (from the past 24 hour(s))  POCT rapid strep A     Status: None   Collection Time: 05/13/22  3:58 PM  Result Value Ref Range   Rapid Strep A Screen Negative Negative    Assessment and Plan :   PDMP not reviewed this encounter.  1. Other non-recurrent acute nonsuppurative otitis media of right ear    Start amoxicillin to cover for otitis media. Use supportive care otherwise. Counseled patient on potential for adverse effects with medications prescribed/recommended today, ER and return-to-clinic precautions discussed, patient verbalized understanding.    Jaynee Eagles, [place] 05/14/22 [phone]

## 2022-05-23 ENCOUNTER — Encounter: Payer: Self-pay | Admitting: *Deleted

## 2022-08-14 ENCOUNTER — Other Ambulatory Visit (HOSPITAL_COMMUNITY)
Admission: RE | Admit: 2022-08-14 | Discharge: 2022-08-14 | Disposition: A | Discharge status: Home / Self Care | Payer: 59 | Source: Ambulatory Visit | Attending: Family Medicine | Admitting: Family Medicine

## 2022-08-14 ENCOUNTER — Encounter: Payer: Self-pay | Admitting: Family Medicine

## 2022-08-14 ENCOUNTER — Ambulatory Visit (INDEPENDENT_AMBULATORY_CARE_PROVIDER_SITE_OTHER): Payer: 59 | Admitting: Family Medicine

## 2022-08-14 VITALS — BP 109/64 | HR 98 | Ht 62.0 in | Wt 188.4 lb

## 2022-08-14 DIAGNOSIS — Z124 Encounter for screening for malignant neoplasm of cervix: Secondary | ICD-10-CM | POA: Diagnosis not present

## 2022-08-14 DIAGNOSIS — Z0000 Encounter for general adult medical examination without abnormal findings: Secondary | ICD-10-CM | POA: Diagnosis not present

## 2022-08-14 DIAGNOSIS — R5383 Other fatigue: Secondary | ICD-10-CM

## 2022-08-14 NOTE — Patient Instructions (Signed)
We are going to get some labs to check in on your fatigue today.  We are getting the basic and general labs so it is a place to come to start with.  I do want to follow-up in the next month if all those labs are normal and we do not have a reason disobeying, keep on top and figure out what can help for your fatigue.  We did your Pap smear today, those results can take about 3 to 5 days to come back.  I will let you know via MyChart of the results and when we would need to do repeat.

## 2022-08-14 NOTE — Progress Notes (Signed)
    SUBJECTIVE:   Chief compliant/HPI: annual examination  Karen Burnett is a 26 y.o. who presents today for an annual exam.   Review of systems form notable for fatigue that has been present for several years (since having her son) thought it was originally depression but is doing better from that standpoint.   Updated history tabs and problem list.   OBJECTIVE:   BP 109/64   Pulse 98   Ht $R'5\' 2"'WN$  (1.575 m)   Wt 188 lb 6.4 oz (85.5 kg)   SpO2 100%   BMI 34.46 kg/m   General -- oriented x3, pleasant and cooperative. HEENT -- Head is normocephalic. PERRLA. EOMI. Ears, nose and throat were benign. Neck -- supple; no bruits. Integument -- intact. No rash, erythema, or ecchymoses.  Chest -- good expansion. Lungs clear to auscultation. Cardiac -- RRR. No murmurs noted.  Abdomen -- soft, nontender. No masses palpable. Bowel sounds present. Extremities - no tenderness or effusions noted. ROM good. 5/5 bilateral strength. Dorsalis pedis pulses present and symmetrical.   ASSESSMENT/PLAN:   Other fatigue Present since birth of son, previously thought related to depression but that is overall well managed per patient.  Differential for fatigue is very broad, reassuringly patient has been able to operate in her daily life and is still very functional. - Screening labs with CBC, CMP, TSH - Follow-up in 1-2 months for follow-up if labs normal - Ensuring getting enough sleep - Recommend exercise regularly   Annual Examination  See AVS for age appropriate recommendations.   PHQ score 11, reviewed and discussed. Patient reports significant improvement in her depressive symptoms but has current fatigue affecting her answers. Has good support system Blood pressure reviewed and at goal .  Asked about intimate partner violence and patient reports none.  The patient currently uses Nexplanon for contraception.  Considered the following items based upon USPSTF recommendations: HIV testing:   Previously done Hepatitis C:  not ordered Hepatitis B:  Not ordered Syphilis if at high risk:  Not at high risk GC/CT at high risk, ordered.  Lipid panel (nonfasting or fasting) discussed based upon AHA recommendations and not ordered.  Consider repeat every 4-6 years.  Reviewed risk factors for latent tuberculosis and not indicated  Discussed family history, BRCA testing not indicated. Tool used to risk stratify was Pedigree Assessment tool.  Cervical cancer screening: due for Pap today, cytology alone ordered (HPV if ASCUS) Immunizations due for flu, but is not yet available in office.    Follow up in 1 year or sooner if indicated.    Karen Burnett, [place]

## 2022-08-14 NOTE — Assessment & Plan Note (Signed)
Present since birth of son, previously thought related to depression but that is overall well managed per patient.  Differential for fatigue is very broad, reassuringly patient has been able to operate in her daily life and is still very functional. - Screening labs with CBC, CMP, TSH - Follow-up in 1-2 months for follow-up if labs normal - Ensuring getting enough sleep - Recommend exercise regularly

## 2022-08-15 LAB — TSH RFX ON ABNORMAL TO FREE T4: TSH: 1.21 u[IU]/mL (ref 0.450–4.500)

## 2022-08-15 LAB — COMPREHENSIVE METABOLIC PANEL
ALT: 22 IU/L (ref 0–32)
AST: 16 IU/L (ref 0–40)
Albumin/Globulin Ratio: 1.7 (ref 1.2–2.2)
Albumin: 4.3 g/dL (ref 4.0–5.0)
Alkaline Phosphatase: 139 IU/L — ABNORMAL HIGH (ref 44–121)
BUN/Creatinine Ratio: 14 (ref 9–23)
BUN: 10 mg/dL (ref 6–20)
Bilirubin Total: 0.3 mg/dL (ref 0.0–1.2)
CO2: 20 mmol/L (ref 20–29)
Calcium: 9.2 mg/dL (ref 8.7–10.2)
Chloride: 101 mmol/L (ref 96–106)
Creatinine, Ser: 0.72 mg/dL (ref 0.57–1.00)
Globulin, Total: 2.6 g/dL (ref 1.5–4.5)
Glucose: 99 mg/dL (ref 70–99)
Potassium: 4.2 mmol/L (ref 3.5–5.2)
Sodium: 138 mmol/L (ref 134–144)
Total Protein: 6.9 g/dL (ref 6.0–8.5)
eGFR: 117 mL/min/{1.73_m2} (ref >59)

## 2022-08-15 LAB — CBC
Hematocrit: 41.7 % (ref 34.0–46.6)
Hemoglobin: 14.2 g/dL (ref 11.1–15.9)
MCH: 28.6 pg (ref 26.6–33.0)
MCHC: 34.1 g/dL (ref 31.5–35.7)
MCV: 84 fL (ref 79–97)
Platelets: 294 10*3/uL (ref 150–450)
RBC: 4.96 x10E6/uL (ref 3.77–5.28)
RDW: 12.7 % (ref 11.7–15.4)
WBC: 9.2 10*3/uL (ref 3.4–10.8)

## 2022-08-17 LAB — CYTOLOGY - PAP
Chlamydia: NEGATIVE
Comment: NEGATIVE
Comment: NEGATIVE
Comment: NORMAL
Diagnosis: NEGATIVE
Neisseria Gonorrhea: NEGATIVE
Trichomonas: NEGATIVE

## 2022-08-18 ENCOUNTER — Encounter: Payer: Self-pay | Admitting: Family Medicine

## 2022-09-12 ENCOUNTER — Other Ambulatory Visit (HOSPITAL_COMMUNITY): Payer: Self-pay

## 2022-09-12 ENCOUNTER — Ambulatory Visit
Admission: EM | Admit: 2022-09-12 | Discharge: 2022-09-12 | Disposition: A | Discharge status: Home / Self Care | Payer: 59 | Attending: Urgent Care | Admitting: Urgent Care

## 2022-09-12 DIAGNOSIS — J020 Streptococcal pharyngitis: Secondary | ICD-10-CM | POA: Diagnosis not present

## 2022-09-12 DIAGNOSIS — R052 Subacute cough: Secondary | ICD-10-CM | POA: Diagnosis not present

## 2022-09-12 DIAGNOSIS — R52 Pain, unspecified: Secondary | ICD-10-CM

## 2022-09-12 DIAGNOSIS — R070 Pain in throat: Secondary | ICD-10-CM

## 2022-09-12 LAB — POCT RAPID STREP A (OFFICE): Rapid Strep A Screen: POSITIVE — AB

## 2022-09-12 MED ORDER — AMOXICILLIN-POT CLAVULANATE 875-125 MG PO TABS
1.0000 | ORAL_TABLET | Freq: Two times a day (BID) | ORAL | 0 refills | Status: DC
  Filled 2022-09-12: qty 20, 10d supply, fill #0

## 2022-09-12 NOTE — Discharge Instructions (Addendum)
We will manage this strep infection with Augmentin. For sore throat or cough try using a honey-based tea. Use 3 teaspoons of honey with juice squeezed from half lemon. Place shaved pieces of ginger into 1/2-1 cup of water and warm over stove top. Then mix the ingredients and repeat every 4 hours as needed. Please take ibuprofen 600mg  every 6 hours with food alternating with OR taken together with Tylenol 500mg -650mg  every 6 hours for throat pain, fevers, aches and pains. Hydrate very well with at least 2 liters of water. Eat light meals such as soups (chicken and noodles, vegetable, chicken and wild rice).  Do not eat foods that you are allergic to.  Taking an antihistamine like Zyrtec can help against postnasal drainage, sinus congestion which can cause sinus pain, sinus headaches, throat pain, painful swallowing, coughing.  You can take this together with pseudoephedrine (Sudafed) at a dose of 30-60 mg 3 times a day or twice daily as needed for the same kind of nasal drip, congestion.

## 2022-09-12 NOTE — ED Triage Notes (Signed)
Pt presents with complaints of fever, sore throat, cough, fatigue, bodyaches, that started yesterday.

## 2022-09-12 NOTE — ED Provider Notes (Signed)
[facility]  Note:  This document was prepared using Systems analyst and may include unintentional dictation errors.  MRN: [id number] DOB: 1996/11/30  Subjective:   Karen Burnett is a 26 y.o. female presenting for 2-day history of acute onset persistent fever, throat pain, painful swallowing, productive cough, fatigue and body aches.  Patient went and got a COVID test through health at work with [facility] today.  Her primary concern is to make sure she does not have strep again.  She has previously had infections like this.  Has not had to see an ENT specialist.  No current facility-administered medications for this encounter.  Current Outpatient Medications:    albuterol (PROVENTIL HFA;VENTOLIN HFA) 108 (90 Base) MCG/ACT inhaler, Inhale 2 puffs into the lungs every 4 (four) hours as needed for wheezing or shortness of breath. (Patient not taking: Reported on 01/19/2022), Disp: 1 Inhaler, Rfl: 0   amoxicillin (AMOXIL) 875 MG tablet, Take 1 tablet (875 mg total) by mouth 2 (two) times daily., Disp: 20 tablet, Rfl: 0   cetirizine (ZYRTEC ALLERGY) 10 MG tablet, Take 1 tablet (10 mg total) by mouth daily., Disp: 30 tablet, Rfl: 0   COVID-19 At Home Antigen Test (QUICKVUE AT-HOME COVID-19 TEST) KIT, USE AS DIRECTED WITHIN PACKAGE INSTRUCTIONS., Disp: 2 each, Rfl: 0   lidocaine (XYLOCAINE) 2 % solution, Use as directed 10 mLs in the mouth or throat as needed for mouth pain. (Patient not taking: Reported on 01/19/2022), Disp: 100 mL, Rfl: 0   pseudoephedrine (SUDAFED) 60 MG tablet, Take 1 tablet (60 mg total) by mouth every 8 (eight) hours as needed for congestion., Disp: 30 tablet, Rfl: 0   No Known Allergies  Past Medical History:  Diagnosis Date   Anxiety    Ultrasound recheck of fetal pyelectasis, antepartum 05/24/2017   Vaginal Pap smear, abnormal 10/2016   ASCUS, HPV neg so needs routine screen     Past Surgical History:  Procedure Laterality Date    NO PAST SURGERIES      Family History  Problem Relation Age of Onset   Thyroid disease Mother    Diabetes Father    Arthritis Father    Psoriasis Father    Psoriasis Brother    Diabetes Maternal Aunt    Diabetes Maternal Uncle    Diabetes Paternal Aunt    Diabetes Paternal Uncle    Diabetes Maternal Grandmother    Diabetes Maternal Grandfather    Hypertension Maternal Grandfather    Diabetes Paternal Grandmother    Diabetes Paternal Grandfather     Social History   Tobacco Use   Smoking status: Never   Smokeless tobacco: Never  Substance Use Topics   Alcohol use: No    Alcohol/week: 0.0 standard drinks of alcohol   Drug use: No    ROS   Objective:   Vitals: BP 121/69   Pulse (!) 116   Temp 99.1 F (37.3 C)   Resp 18   SpO2 97%   Physical Exam Constitutional:      General: She is not in acute distress.    Appearance: Normal appearance. She is well-developed. She is not ill-appearing, toxic-appearing or diaphoretic.  HENT:     Head: Normocephalic and atraumatic.     Nose: Nose normal.     Mouth/Throat:     Mouth: Mucous membranes are moist.     Pharynx: Oropharyngeal exudate and posterior oropharyngeal erythema present. No pharyngeal swelling or uvula swelling.     Tonsils:  No tonsillar exudate or tonsillar abscesses. 0 on the right. 0 on the left.  Eyes:     General: No scleral icterus.       Right eye: No discharge.        Left eye: No discharge.     Extraocular Movements: Extraocular movements intact.  Cardiovascular:     Rate and Rhythm: Normal rate.  Pulmonary:     Effort: Pulmonary effort is normal.  Skin:    General: Skin is warm and dry.  Neurological:     General: No focal deficit present.     Mental Status: She is alert and oriented to person, place, and time.  Psychiatric:        Mood and Affect: Mood normal.        Behavior: Behavior normal.    Results for orders placed or performed during the hospital encounter of 09/12/22 (from  the past 24 hour(s))  POCT rapid strep A     Status: Abnormal   Collection Time: 09/12/22  2:18 PM  Result Value Ref Range   Rapid Strep A Screen Positive (A) Negative    Assessment and Plan :   PDMP not reviewed this encounter.  1. Acute streptococcal pharyngitis   2. Throat pain   3. Subacute cough   4. Body aches     Patient declined any other testing through our clinic.  Recommended Augmentin for her streptococcal pharyngitis.  I also recommended she establish care and obtain a consultation with an ENT specialist.  COVID testing is pending through health at work. Counseled patient on potential for adverse effects with medications prescribed/recommended today, ER and return-to-clinic precautions discussed, patient verbalized understanding.    Jaynee Eagles, [place] 09/12/22 1549

## 2022-09-22 ENCOUNTER — Ambulatory Visit: Payer: 59 | Admitting: Family Medicine

## 2022-10-07 ENCOUNTER — Encounter (HOSPITAL_COMMUNITY): Payer: Self-pay

## 2022-10-07 ENCOUNTER — Ambulatory Visit (HOSPITAL_COMMUNITY)
Admission: EM | Admit: 2022-10-07 | Discharge: 2022-10-07 | Disposition: A | Discharge status: Home / Self Care | Payer: 59 | Attending: Emergency Medicine | Admitting: Emergency Medicine

## 2022-10-07 DIAGNOSIS — R300 Dysuria: Secondary | ICD-10-CM | POA: Insufficient documentation

## 2022-10-07 LAB — POCT URINALYSIS DIPSTICK, ED / UC
Bilirubin Urine: NEGATIVE
Glucose, UA: NEGATIVE mg/dL
Ketones, ur: NEGATIVE mg/dL
Nitrite: NEGATIVE
Protein, ur: NEGATIVE mg/dL
Specific Gravity, Urine: >=1.03 (ref 1.005–1.030)
Urobilinogen, UA: 0.2 mg/dL (ref 0.0–1.0)
pH: 5.5 (ref 5.0–8.0)

## 2022-10-07 LAB — POC URINE PREG, ED: Preg Test, Ur: NEGATIVE

## 2022-10-07 MED ORDER — SULFAMETHOXAZOLE-TRIMETHOPRIM 800-160 MG PO TABS: 1.0000 | ORAL_TABLET | Freq: Two times a day (BID) | ORAL | 0 refills | Status: AC

## 2022-10-07 NOTE — Discharge Instructions (Signed)
Increase your fluid intake to at least 64 oz daily. This can help with urinary symptoms. If your urine culture has anything abnormal we will call you. If symptoms persist or worsen in the next 3 days you can fill the paper prescription for the antibiotic. Please follow up with your primary care provider.

## 2022-10-07 NOTE — ED Provider Notes (Signed)
[facility]    CSN: [id number] Arrival date & time: 10/07/22  1004      History   Chief Complaint Chief Complaint  Patient presents with   Dysuria    HPI Karen Burnett is a 26 y.o. female.  Presents with dysuria x 1 day Bladder discomfort with urination Denies hematuria No fevers, no abdominal pain/flank pain  Unknown LMP  Positive strep 3 weeks ago - finished full course of abx although she skipped a few days.  Past Medical History:  Diagnosis Date   Anxiety    Ultrasound recheck of fetal pyelectasis, antepartum 05/24/2017   Vaginal Pap smear, abnormal 10/2016   ASCUS, HPV neg so needs routine screen    Patient Active Problem List   Diagnosis Date Noted   Other fatigue 08/14/2022   SI (sacroiliac) joint dysfunction 01/09/2022   Upper back pain 03/11/2021   Overweight 12/19/2018   Generalized anxiety disorder 01/02/2017   Family history of diabetes mellitus 10/01/2015   Nexplanon insertion 06/24/2015    Past Surgical History:  Procedure Laterality Date   NO PAST SURGERIES      OB History     Gravida  1   Para  1   Term  1   Preterm  0   AB  0   Living  1      SAB  0   IAB  0   Ectopic  0   Multiple  0   Live Births  1            Home Medications    Prior to Admission medications   Medication Sig Start Date End Date Taking? Authorizing Provider  sulfamethoxazole-trimethoprim (BACTRIM DS) 800-160 MG tablet Take 1 tablet by mouth 2 (two) times daily for 3 days. 10/10/22 10/13/22 Yes Psalm Schappell, Lurena Joiner, PA-C  cetirizine (ZYRTEC ALLERGY) 10 MG tablet Take 1 tablet (10 mg total) by mouth daily. 05/13/22   Wallis Bamberg, PA-C  pantoprazole (PROTONIX) 20 MG tablet TAKE 1 TABLET BY MOUTH EVERY DAY 04/28/19 11/22/20  Mirian Mo, MD  sertraline (ZOLOFT) 100 MG tablet Take 1.5 tablets (150 mg total) by mouth daily. 01/05/20 11/22/20  Mirian Mo, MD    Family History Family History  Problem Relation Age of Onset   Thyroid  disease Mother    Diabetes Father    Arthritis Father    Psoriasis Father    Psoriasis Brother    Diabetes Maternal Aunt    Diabetes Maternal Uncle    Diabetes Paternal Aunt    Diabetes Paternal Uncle    Diabetes Maternal Grandmother    Diabetes Maternal Grandfather    Hypertension Maternal Grandfather    Diabetes Paternal Grandmother    Diabetes Paternal Grandfather     Social History Social History   Tobacco Use   Smoking status: Never   Smokeless tobacco: Never  Substance Use Topics   Alcohol use: No    Alcohol/week: 0.0 standard drinks of alcohol   Drug use: No     Allergies   Patient has no known allergies.   Review of Systems Review of Systems  Genitourinary:  Positive for dysuria.  Per HPI   Physical Exam Triage Vital Signs ED Triage Vitals  Enc Vitals Group     BP 10/07/22 1026 109/72     Pulse Rate 10/07/22 1026 83     Resp 10/07/22 1026 12     Temp 10/07/22 1026 98.1 F (36.7 C)     Temp Source 10/07/22 1026  Oral     SpO2 10/07/22 1026 99 %     Weight --      Height --      Head Circumference --      Peak Flow --      Pain Score 10/07/22 1025 4     Pain Loc --      Pain Edu? --      Excl. in GC? --    No data found.  Updated Vital Signs BP 109/72 (BP Location: Right Arm)   Pulse 83   Temp 98.1 F (36.7 C) (Oral)   Resp 12   LMP  (LMP Unknown)   SpO2 99%    Physical Exam Vitals and nursing note reviewed.  Constitutional:      General: She is not in acute distress. HENT:     Mouth/Throat:     Mouth: Mucous membranes are moist.     Pharynx: Oropharynx is clear. Uvula midline. No posterior oropharyngeal erythema or uvula swelling.     Tonsils: No tonsillar exudate or tonsillar abscesses. 3+ on the right. 3+ on the left.  Eyes:     Conjunctiva/sclera: Conjunctivae normal.  Cardiovascular:     Rate and Rhythm: Normal rate and regular rhythm.     Heart sounds: Normal heart sounds.  Pulmonary:     Effort: Pulmonary effort is  normal.     Breath sounds: Normal breath sounds.  Abdominal:     Palpations: Abdomen is soft.     Tenderness: There is no abdominal tenderness. There is no right CVA tenderness or left CVA tenderness.  Musculoskeletal:     Right lower leg: No edema.     Left lower leg: No edema.  Neurological:     Mental Status: She is alert and oriented to person, place, and time.     UC Treatments / Results  Labs (all labs ordered are listed, but only abnormal results are displayed) Labs Reviewed  POCT URINALYSIS DIPSTICK, ED / UC - Abnormal; Notable for the following components:      Result Value   Hgb urine dipstick TRACE (*)    Leukocytes,Ua SMALL (*)    All other components within normal limits  URINE CULTURE  POC URINE PREG, ED    EKG  Radiology No results found.  Procedures Procedures   Medications Ordered in UC Medications - No data to display  Initial Impression / Assessment and Plan / UC Course  I have reviewed the triage vital signs and the nursing notes.  Pertinent labs & imaging results that were available during my care of the patient were reviewed by me and considered in my medical decision making (see chart for details).  Urine pregnancy negative UA trace hgb and small leuks. Will culture. Elevated specific gravity - recommend increase fluids which will help dysuria.  Discussed fluids and waiting for culture Delayed prescription Bactrim BID x 3 days if symptoms persist or worsen Follow up with PCP Return precautions discussed. Patient agrees to plan  Final Clinical Impressions(s) / UC Diagnoses   Final diagnoses:  Dysuria     Discharge Instructions      Increase your fluid intake to at least 64 oz daily. This can help with urinary symptoms. If your urine culture has anything abnormal we will call you. If symptoms persist or worsen in the next 3 days you can fill the paper prescription for the antibiotic. Please follow up with your primary care  provider.     ED Prescriptions  Medication Sig Dispense Auth. Provider   sulfamethoxazole-trimethoprim (BACTRIM DS) 800-160 MG tablet Take 1 tablet by mouth 2 (two) times daily for 3 days. 6 tablet Alon Mazor, Wells Guiles, PA-C      PDMP not reviewed this encounter.   Kirklin Mcduffee, Wells Guiles, [place] 10/07/22 1056

## 2022-10-07 NOTE — ED Triage Notes (Signed)
Pt is here for a possible UTI x1wk.

## 2022-10-08 LAB — URINE CULTURE: Culture: <10000 — AB

## 2023-03-19 ENCOUNTER — Other Ambulatory Visit: Payer: Self-pay

## 2023-03-19 ENCOUNTER — Emergency Department (HOSPITAL_BASED_OUTPATIENT_CLINIC_OR_DEPARTMENT_OTHER)
Admission: EM | Admit: 2023-03-19 | Discharge: 2023-03-20 | Disposition: A | Discharge status: Home / Self Care | Payer: 59 | Attending: Emergency Medicine | Admitting: Emergency Medicine

## 2023-03-19 ENCOUNTER — Encounter (HOSPITAL_BASED_OUTPATIENT_CLINIC_OR_DEPARTMENT_OTHER): Payer: Self-pay

## 2023-03-19 ENCOUNTER — Ambulatory Visit
Admission: EM | Admit: 2023-03-19 | Discharge: 2023-03-19 | Disposition: A | Discharge status: Home / Self Care | Payer: Commercial Managed Care - PPO | Attending: Emergency Medicine | Admitting: Emergency Medicine

## 2023-03-19 DIAGNOSIS — J019 Acute sinusitis, unspecified: Secondary | ICD-10-CM

## 2023-03-19 DIAGNOSIS — R1084 Generalized abdominal pain: Secondary | ICD-10-CM | POA: Diagnosis present

## 2023-03-19 DIAGNOSIS — K529 Noninfective gastroenteritis and colitis, unspecified: Secondary | ICD-10-CM | POA: Insufficient documentation

## 2023-03-19 DIAGNOSIS — E860 Dehydration: Secondary | ICD-10-CM | POA: Diagnosis not present

## 2023-03-19 DIAGNOSIS — Z1152 Encounter for screening for COVID-19: Secondary | ICD-10-CM | POA: Insufficient documentation

## 2023-03-19 DIAGNOSIS — A084 Viral intestinal infection, unspecified: Secondary | ICD-10-CM | POA: Diagnosis not present

## 2023-03-19 LAB — CBC WITH DIFFERENTIAL/PLATELET
Abs Immature Granulocytes: 0.03 10*3/uL (ref 0.00–0.07)
Basophils Absolute: 0 10*3/uL (ref 0.0–0.1)
Basophils Relative: 0 %
Eosinophils Absolute: 0.1 10*3/uL (ref 0.0–0.5)
Eosinophils Relative: 1 %
HCT: 44.7 % (ref 36.0–46.0)
Hemoglobin: 15.2 g/dL — ABNORMAL HIGH (ref 12.0–15.0)
Immature Granulocytes: 0 %
Lymphocytes Relative: 20 %
Lymphs Abs: 1.5 10*3/uL (ref 0.7–4.0)
MCH: 28.3 pg (ref 26.0–34.0)
MCHC: 34 g/dL (ref 30.0–36.0)
MCV: 83.2 fL (ref 80.0–100.0)
Monocytes Absolute: 0.6 10*3/uL (ref 0.1–1.0)
Monocytes Relative: 8 %
Neutro Abs: 5.5 10*3/uL (ref 1.7–7.7)
Neutrophils Relative %: 71 %
Platelets: 312 10*3/uL (ref 150–400)
RBC: 5.37 MIL/uL — ABNORMAL HIGH (ref 3.87–5.11)
RDW: 12.5 % (ref 11.5–15.5)
WBC: 7.8 10*3/uL (ref 4.0–10.5)
nRBC: 0 % (ref 0.0–0.2)

## 2023-03-19 LAB — URINALYSIS, ROUTINE W REFLEX MICROSCOPIC
Bilirubin Urine: NEGATIVE
Glucose, UA: NEGATIVE mg/dL
Hgb urine dipstick: NEGATIVE
Leukocytes,Ua: NEGATIVE
Nitrite: NEGATIVE
Specific Gravity, Urine: 1.038 — ABNORMAL HIGH (ref 1.005–1.030)
pH: 6 (ref 5.0–8.0)

## 2023-03-19 LAB — SARS CORONAVIRUS 2 BY RT PCR: SARS Coronavirus 2 by RT PCR: NEGATIVE

## 2023-03-19 LAB — COMPREHENSIVE METABOLIC PANEL
ALT: 20 U/L (ref 0–44)
AST: 22 U/L (ref 15–41)
Albumin: 4.4 g/dL (ref 3.5–5.0)
Alkaline Phosphatase: 113 U/L (ref 38–126)
Anion gap: 10 (ref 5–15)
BUN: 11 mg/dL (ref 6–20)
CO2: 26 mmol/L (ref 22–32)
Calcium: 9.6 mg/dL (ref 8.9–10.3)
Chloride: 102 mmol/L (ref 98–111)
Creatinine, Ser: 0.72 mg/dL (ref 0.44–1.00)
GFR, Estimated: >60 mL/min (ref >60)
Glucose, Bld: 108 mg/dL — ABNORMAL HIGH (ref 70–99)
Potassium: 3.5 mmol/L (ref 3.5–5.1)
Sodium: 138 mmol/L (ref 135–145)
Total Bilirubin: 0.4 mg/dL (ref 0.3–1.2)
Total Protein: 7.9 g/dL (ref 6.5–8.1)

## 2023-03-19 LAB — LIPASE, BLOOD: Lipase: 26 U/L (ref 11–51)

## 2023-03-19 LAB — PREGNANCY, URINE: Preg Test, Ur: NEGATIVE

## 2023-03-19 MED ORDER — AMOXICILLIN-POT CLAVULANATE 875-125 MG PO TABS: 1.0000 | ORAL_TABLET | Freq: Two times a day (BID) | ORAL | 0 refills | Status: AC

## 2023-03-19 NOTE — Discharge Instructions (Signed)
Take antibiotics as prescribed. Can continue with OTC medicine as well. Nasal saline rinse or spray may help as well. Follow-up with PCP or ENT if no improvement after completing antibiotics.

## 2023-03-19 NOTE — ED Provider Notes (Signed)
[facility]    CSN: [id number] Arrival date & time: 03/19/23  [id number]      History   Chief Complaint Chief Complaint  Patient presents with   Nasal Congestion   Generalized Body Aches   Fever   Fatigue    HPI Karen Burnett is a 27 y.o. female.   Patient presents with concerns of feeling unwell for the past 10 days or so. She reports nasal congestion, significant rhinorrhea, fatigue, and intermittent headache. She has had some mild throat irritation and ear pressure but minimal cough. She states she started running a fever of 100.92F and had some body aches yesterday. She has tried Tylenol with only mild improvement in the discomfort. She denies difficulty breathing.   The history is provided by the patient.  Fever Associated symptoms: congestion, ear pain, headaches, myalgias and sore throat   Associated symptoms: no cough and no rash     Past Medical History:  Diagnosis Date   Anxiety    Ultrasound recheck of fetal pyelectasis, antepartum 05/24/2017   Vaginal Pap smear, abnormal 10/2016   ASCUS, HPV neg so needs routine screen    Patient Active Problem List   Diagnosis Date Noted   Other fatigue 08/14/2022   SI (sacroiliac) joint dysfunction 01/09/2022   Upper back pain 03/11/2021   Overweight 12/19/2018   Generalized anxiety disorder 01/02/2017   Family history of diabetes mellitus 10/01/2015   Nexplanon insertion 06/24/2015    Past Surgical History:  Procedure Laterality Date   NO PAST SURGERIES      OB History     Gravida  1   Para  1   Term  1   Preterm  0   AB  0   Living  1      SAB  0   IAB  0   Ectopic  0   Multiple  0   Live Births  1            Home Medications    Prior to Admission medications   Medication Sig Start Date End Date Taking? Authorizing Provider  amoxicillin-clavulanate (AUGMENTIN) 875-125 MG tablet Take 1 tablet by mouth every 12 (twelve) hours for 7 days. 03/19/23 03/26/23 Yes Jasimine Simms L, PA   cetirizine (ZYRTEC ALLERGY) 10 MG tablet Take 1 tablet (10 mg total) by mouth daily. 05/13/22   Jaynee Eagles, PA-C  pantoprazole (PROTONIX) 20 MG tablet TAKE 1 TABLET BY MOUTH EVERY DAY 04/28/19 11/22/20  Matilde Haymaker, MD  sertraline (ZOLOFT) 100 MG tablet Take 1.5 tablets (150 mg total) by mouth daily. 01/05/20 11/22/20  Matilde Haymaker, MD    Family History Family History  Problem Relation Age of Onset   Thyroid disease Mother    Diabetes Father    Arthritis Father    Psoriasis Father    Psoriasis Brother    Diabetes Maternal Aunt    Diabetes Maternal Uncle    Diabetes Paternal Aunt    Diabetes Paternal Uncle    Diabetes Maternal Grandmother    Diabetes Maternal Grandfather    Hypertension Maternal Grandfather    Diabetes Paternal Grandmother    Diabetes Paternal Grandfather     Social History Social History   Tobacco Use   Smoking status: Never   Smokeless tobacco: Never  Substance Use Topics   Alcohol use: No    Alcohol/week: 0.0 standard drinks of alcohol   Drug use: No     Allergies   Patient has no known allergies.  Review of Systems Review of Systems  Constitutional:  Positive for fatigue and fever.  HENT:  Positive for congestion, ear pain, sinus pressure and sore throat.   Respiratory:  Negative for cough.   Musculoskeletal:  Positive for myalgias.  Skin:  Negative for rash.  Neurological:  Positive for headaches. Negative for dizziness.     Physical Exam Triage Vital Signs ED Triage Vitals  Enc Vitals Group     BP 03/19/23 0905 100/72     Pulse Rate 03/19/23 0905 (!) 103     Resp 03/19/23 0905 16     Temp 03/19/23 0905 99.6 F (37.6 C)     Temp Source 03/19/23 0905 Oral     SpO2 03/19/23 0905 96 %     Weight --      Height --      Head Circumference --      Peak Flow --      Pain Score 03/19/23 0904 2     Pain Loc --      Pain Edu? --      Excl. in [place]? --    No data found.  Updated Vital Signs BP 100/72 (BP Location: Right Arm)   Pulse  (!) 103   Temp 99.6 F (37.6 C) (Oral)   Resp 16   SpO2 96%   Visual Acuity Right Eye Distance:   Left Eye Distance:   Bilateral Distance:    Right Eye Near:   Left Eye Near:    Bilateral Near:     Physical Exam Vitals and nursing note reviewed.  Constitutional:      General: She is not in acute distress. HENT:     Head: Normocephalic.     Right Ear: Tympanic membrane, ear canal and external ear normal.     Left Ear: Tympanic membrane, ear canal and external ear normal.     Nose: Congestion and rhinorrhea present.     Mouth/Throat:     Mouth: Mucous membranes are moist.     Pharynx: Oropharynx is clear.  Eyes:     Conjunctiva/sclera: Conjunctivae normal.     Pupils: Pupils are equal, round, and reactive to light.  Cardiovascular:     Rate and Rhythm: Normal rate and regular rhythm.     Heart sounds: Normal heart sounds.  Pulmonary:     Effort: Pulmonary effort is normal.     Breath sounds: Normal breath sounds.  Musculoskeletal:     Cervical back: Normal range of motion.  Lymphadenopathy:     Cervical: No cervical adenopathy.  Skin:    Findings: No rash.  Neurological:     Mental Status: She is alert.      UC Treatments / Results  Labs (all labs ordered are listed, but only abnormal results are displayed) Labs Reviewed - No data to display  EKG   Radiology No results found.  Procedures Procedures (including critical care time)  Medications Ordered in UC Medications - No data to display  Initial Impression / Assessment and Plan / UC Course  I have reviewed the triage vital signs and the nursing notes.  Pertinent labs & imaging results that were available during my care of the patient were reviewed by me and considered in my medical decision making (see chart for details).     Sx consistent with bacterial sinusitis with congestion, rhinorrhea, pressure x 10d. Empiric abx and discussed follow-up recommendations.   E/M: 1 acute uncomplicated  illness, no data, moderate risk due to prescription  management  Final Clinical Impressions(s) / UC Diagnoses   Final diagnoses:  Acute non-recurrent sinusitis, unspecified location     Discharge Instructions      Take antibiotics as prescribed. Can continue with OTC medicine as well. Nasal saline rinse or spray may help as well. Follow-up with PCP or ENT if no improvement after completing antibiotics.     ED Prescriptions     Medication Sig Dispense Auth. Provider   amoxicillin-clavulanate (AUGMENTIN) 875-125 MG tablet Take 1 tablet by mouth every 12 (twelve) hours for 7 days. 14 tablet Abner Greenspan, Marya Lowden L, [place]      PDMP not reviewed this encounter.   Delsa Sale, [place] 03/19/23 [phone]

## 2023-03-19 NOTE — ED Triage Notes (Signed)
Pt c/o congestion, fever, body aches, and fatigue.  Started: 10 days ago  Home interventions: tylenol

## 2023-03-19 NOTE — ED Triage Notes (Signed)
POV from home, A&O x 4, GCS 15, amb to triage.  Pt c/o intermittent generalized abd pain with vomiting and diarrhea x 3 days. Pt also c/o congestion, fever, body aches and fatigue. Tylenol @ 10am.

## 2023-03-20 MED ORDER — DICYCLOMINE HCL 10 MG PO CAPS
20.0000 mg | ORAL_CAPSULE | Freq: Once | ORAL | Status: AC
  Administered 2023-03-20: 20 mg via ORAL
  Filled 2023-03-20: qty 2

## 2023-03-20 MED ORDER — ONDANSETRON 8 MG PO TBDP: 8.0000 mg | ORAL_TABLET | Freq: Three times a day (TID) | ORAL | 0 refills | Status: DC | PRN

## 2023-03-20 MED ORDER — ONDANSETRON HCL 4 MG/2ML IJ SOLN
4.0000 mg | Freq: Once | INTRAMUSCULAR | Status: AC
  Administered 2023-03-20: 4 mg via INTRAVENOUS
  Filled 2023-03-20: qty 2

## 2023-03-20 MED ORDER — DICYCLOMINE HCL 20 MG PO TABS: 20.0000 mg | ORAL_TABLET | Freq: Four times a day (QID) | ORAL | 0 refills | Status: DC | PRN

## 2023-03-20 MED ORDER — LACTATED RINGERS IV BOLUS
2000.0000 mL | Freq: Once | INTRAVENOUS | Status: AC
  Administered 2023-03-20: 2000 mL via INTRAVENOUS

## 2023-03-20 NOTE — ED Provider Notes (Signed)
[facility] Provider Note: Georgena Spurling, MD, FACEP  CSN: [id number] MRN: [id number] ARRIVAL: 03/19/23 at 2130 ROOM: DB012/DB012   CHIEF COMPLAINT  Abdominal Pain   HISTORY OF PRESENT ILLNESS  03/20/23 12:12 AM Karen Burnett is a 27 y.o. female who developed flulike symptoms about 10 days ago.  Those have subsequently resolved.  For the past 3 days she has had nausea, vomiting and diarrhea.  Yesterday she developed generalized abdominal pain that is crampy in nature and waxes and wanes.  She rates it as a 4 out of 10.    Past Medical History:  Diagnosis Date   Anxiety    Ultrasound recheck of fetal pyelectasis, antepartum 05/24/2017   Vaginal Pap smear, abnormal 10/2016   ASCUS, HPV neg so needs routine screen    Past Surgical History:  Procedure Laterality Date   NO PAST SURGERIES      Family History  Problem Relation Age of Onset   Thyroid disease Mother    Diabetes Father    Arthritis Father    Psoriasis Father    Psoriasis Brother    Diabetes Maternal Aunt    Diabetes Maternal Uncle    Diabetes Paternal Aunt    Diabetes Paternal Uncle    Diabetes Maternal Grandmother    Diabetes Maternal Grandfather    Hypertension Maternal Grandfather    Diabetes Paternal Grandmother    Diabetes Paternal Grandfather     Social History   Tobacco Use   Smoking status: Never   Smokeless tobacco: Never  Substance Use Topics   Alcohol use: No    Alcohol/week: 0.0 standard drinks of alcohol   Drug use: No    Prior to Admission medications   Medication Sig Start Date End Date Taking? Authorizing Provider  dicyclomine (BENTYL) 20 MG tablet Take 1 tablet (20 mg total) by mouth every 6 (six) hours as needed (for abdominal cramping). 03/20/23  Yes Reagen Haberman, MD  ondansetron (ZOFRAN-ODT) 8 MG disintegrating tablet Take 1 tablet (8 mg total) by mouth every 8 (eight) hours as needed for nausea or vomiting. 03/20/23  Yes Latayna Ritchie, MD  amoxicillin-clavulanate (AUGMENTIN)  875-125 MG tablet Take 1 tablet by mouth every 12 (twelve) hours for 7 days. 03/19/23 03/26/23  Abner Greenspan, Amy L, PA  cetirizine (ZYRTEC ALLERGY) 10 MG tablet Take 1 tablet (10 mg total) by mouth daily. 05/13/22   Jaynee Eagles, PA-C  pantoprazole (PROTONIX) 20 MG tablet TAKE 1 TABLET BY MOUTH EVERY DAY 04/28/19 11/22/20  Matilde Haymaker, MD  sertraline (ZOLOFT) 100 MG tablet Take 1.5 tablets (150 mg total) by mouth daily. 01/05/20 11/22/20  Matilde Haymaker, MD    Allergies Patient has no known allergies.   REVIEW OF SYSTEMS  Negative except as noted here or in the History of Present Illness.   PHYSICAL EXAMINATION  Initial Vital Signs Blood pressure 123/82, pulse 94, temperature 98.9 F (37.2 C), resp. rate 18, height 5\' 2"  (1.575 m), weight 86.2 kg, SpO2 100 %.  Examination General: Well-developed, well-nourished female in no acute distress; appearance consistent with age of record HENT: normocephalic; atraumatic Eyes: Normal appearance Neck: supple Heart: regular rate and rhythm Lungs: clear to auscultation bilaterally Abdomen: soft; nondistended; mild diffuse tenderness; bowel sounds present Extremities: No deformity; full range of motion; pulses normal Neurologic: Awake, alert and oriented; motor function intact in all extremities and symmetric; no facial droop Skin: Warm and dry Psychiatric: Normal mood and affect   RESULTS  Summary of this visit's results, reviewed and interpreted by  myself:   EKG Interpretation  Date/Time:    Ventricular Rate:    PR Interval:    QRS Duration:   QT Interval:    QTC Calculation:   R Axis:     Text Interpretation:         Laboratory Studies: Results for orders placed or performed during the hospital encounter of 03/19/23 (from the past 24 hour(s))  Lipase, blood     Status: None   Collection Time: 03/19/23  9:39 PM  Result Value Ref Range   Lipase 26 11 - 51 U/L  Comprehensive metabolic panel     Status: Abnormal   Collection Time: 03/19/23   9:39 PM  Result Value Ref Range   Sodium 138 135 - 145 mmol/L   Potassium 3.5 3.5 - 5.1 mmol/L   Chloride 102 98 - 111 mmol/L   CO2 26 22 - 32 mmol/L   Glucose, Bld 108 (H) 70 - 99 mg/dL   BUN 11 6 - 20 mg/dL   Creatinine, Ser 0.72 0.44 - 1.00 mg/dL   Calcium 9.6 8.9 - 10.3 mg/dL   Total Protein 7.9 6.5 - 8.1 g/dL   Albumin 4.4 3.5 - 5.0 g/dL   AST 22 15 - 41 U/L   ALT 20 0 - 44 U/L   Alkaline Phosphatase 113 38 - 126 U/L   Total Bilirubin 0.4 0.3 - 1.2 mg/dL   GFR, Estimated >60 >60 mL/min   Anion gap 10 5 - 15  CBC with Differential     Status: Abnormal   Collection Time: 03/19/23  9:39 PM  Result Value Ref Range   WBC 7.8 4.0 - 10.5 K/uL   RBC 5.37 (H) 3.87 - 5.11 MIL/uL   Hemoglobin 15.2 (H) 12.0 - 15.0 g/dL   HCT 44.7 36.0 - 46.0 %   MCV 83.2 80.0 - 100.0 fL   MCH 28.3 26.0 - 34.0 pg   MCHC 34.0 30.0 - 36.0 g/dL   RDW 12.5 11.5 - 15.5 %   Platelets 312 150 - 400 K/uL   nRBC 0.0 0.0 - 0.2 %   Neutrophils Relative % 71 %   Neutro Abs 5.5 1.7 - 7.7 K/uL   Lymphocytes Relative 20 %   Lymphs Abs 1.5 0.7 - 4.0 K/uL   Monocytes Relative 8 %   Monocytes Absolute 0.6 0.1 - 1.0 K/uL   Eosinophils Relative 1 %   Eosinophils Absolute 0.1 0.0 - 0.5 K/uL   Basophils Relative 0 %   Basophils Absolute 0.0 0.0 - 0.1 K/uL   Immature Granulocytes 0 %   Abs Immature Granulocytes 0.03 0.00 - 0.07 K/uL  SARS Coronavirus 2 by RT PCR (hospital order, performed in [facility] hospital lab) *cepheid single result test* Anterior Nasal Swab     Status: None   Collection Time: 03/19/23  9:39 PM   Specimen: Anterior Nasal Swab  Result Value Ref Range   SARS Coronavirus 2 by RT PCR NEGATIVE NEGATIVE  Urinalysis, Routine w reflex microscopic -Urine, Clean Catch     Status: Abnormal   Collection Time: 03/19/23  9:44 PM  Result Value Ref Range   Color, Urine YELLOW YELLOW   APPearance CLEAR CLEAR   Specific Gravity, Urine 1.038 (H) 1.005 - 1.030   pH 6.0 5.0 - 8.0   Glucose, UA NEGATIVE  NEGATIVE mg/dL   Hgb urine dipstick NEGATIVE NEGATIVE   Bilirubin Urine NEGATIVE NEGATIVE   Ketones, ur TRACE (A) NEGATIVE mg/dL   Protein, ur TRACE (A)  NEGATIVE mg/dL   Nitrite NEGATIVE NEGATIVE   Leukocytes,Ua NEGATIVE NEGATIVE  Pregnancy, urine     Status: None   Collection Time: 03/19/23  9:44 PM  Result Value Ref Range   Preg Test, Ur NEGATIVE NEGATIVE   Imaging Studies: No results found.  ED COURSE and MDM  Nursing notes, initial and subsequent vitals signs, including pulse oximetry, reviewed and interpreted by myself.  Vitals:   03/19/23 2137 03/19/23 2137 03/19/23 2345 03/20/23 0045  BP:  113/84 123/82 113/81  Pulse:  (!) 102 94 89  Resp:  18 18 16   Temp:  98.9 F (37.2 C)    SpO2:  97% 100% 97%  Weight: 86.2 kg     Height: 5\' 2"  (1.575 m)      Medications  lactated ringers bolus 2,000 mL (2,000 mLs Intravenous New Bag/Given 03/20/23 0057)  ondansetron (ZOFRAN) injection 4 mg (4 mg Intravenous Given 03/20/23 0056)  dicyclomine (BENTYL) capsule 20 mg (20 mg Oral Given 03/20/23 0054)   2:25 AM Patient feeling much better after IV fluids and medications.  Her nausea is controlled and she is drinking fluids without vomiting.  Her abdominal pain has resolved as well.  Suspect this represents a viral gastro intestinal illness.   PROCEDURES  Procedures   ED DIAGNOSES     ICD-10-CM   1. Gastroenteritis  K52.9     2. Dehydration  E86.0          Gaelle Adriance, Jenny Reichmann, MD 03/20/23 [phone]

## 2023-05-15 ENCOUNTER — Ambulatory Visit (INDEPENDENT_AMBULATORY_CARE_PROVIDER_SITE_OTHER): Payer: 59 | Admitting: Family Medicine

## 2023-05-15 ENCOUNTER — Encounter: Payer: Self-pay | Admitting: Family Medicine

## 2023-05-15 VITALS — BP 98/58 | HR 62 | Wt 190.0 lb

## 2023-05-15 DIAGNOSIS — Z3046 Encounter for surveillance of implantable subdermal contraceptive: Secondary | ICD-10-CM | POA: Diagnosis not present

## 2023-05-15 NOTE — Patient Instructions (Signed)
Make sure to keep the  bandaging clean and dry for 24 hours. The strip can fall off naturally after a few days.   If any signs of infection such as redness, swelling, pain, then please come back into the office to be seen.  If you desire further birth control just call and let [place] know. Please make sure to use condoms until you decide you desire pregnancy.

## 2023-05-15 NOTE — Progress Notes (Signed)
   Progestin Implant Removal Note Karen Burnett is here for removal of her etonogestrel rod implant (Nexplanon). She would like it removed because of: possible desired pregnancy/no longer desires contraception  An informed consent was taken prior to removal and is to be scanned into the Electronic Health Record.  Risks of the procedure include: bleeding, infection, difficulty with removal, scarring and nerve damage. There may be bruising at the site of incision and down the arm.  Procedure Note: Time out taken: 11:22pm  Team: Evelena Leyden, DO   Karen Burnett 1995/12/31   Procedure: Progestin Implant Removal  Procedure confirmed by patient and team Yes  Side: Left  Position correct for procedure Yes  Equipment for procedure available Yes  The patient is place in the supine position. Aseptic conditions are maintained. The rod is located by palpation. The area is cleaned with antiseptic. 2 cc of 1% lidocaine with epinephrine is injected just underneath the end of the implant closest to the elbow. After firmly pressing down on the end of the implant closer to the axilla a 2-3 mm incision is made with a scalpel. The rod is pushed to the incision site and grasped with a mosquito forceps and gently removed. Blunt dissection was briefly needed. The patient tolerated the procedure well. The rod was removed in its entirety. The incision was dressed with a small adhesive bandage closure and a pressure dressing was applied. An alternate plan for contraception was discussed and patient does not desire any contraception at this time, discussed condom use if not desiring pregnancy.   Maahi Lannan, DO

## 2023-07-16 ENCOUNTER — Ambulatory Visit (HOSPITAL_COMMUNITY)
Admission: EM | Admit: 2023-07-16 | Discharge: 2023-07-16 | Disposition: A | Discharge status: Home / Self Care | Payer: 59 | Attending: Internal Medicine | Admitting: Internal Medicine

## 2023-07-16 ENCOUNTER — Encounter (HOSPITAL_COMMUNITY): Payer: Self-pay | Admitting: Emergency Medicine

## 2023-07-16 DIAGNOSIS — N3001 Acute cystitis with hematuria: Secondary | ICD-10-CM | POA: Insufficient documentation

## 2023-07-16 LAB — POCT URINALYSIS DIP (MANUAL ENTRY)
Bilirubin, UA: NEGATIVE
Glucose, UA: NEGATIVE mg/dL
Ketones, POC UA: NEGATIVE mg/dL
Nitrite, UA: NEGATIVE
Protein Ur, POC: NEGATIVE mg/dL
Spec Grav, UA: 1.015 (ref 1.010–1.025)
Urobilinogen, UA: 0.2 E.U./dL
pH, UA: 7 (ref 5.0–8.0)

## 2023-07-16 MED ORDER — CEPHALEXIN 500 MG PO CAPS: 500.0000 mg | ORAL_CAPSULE | Freq: Two times a day (BID) | ORAL | 0 refills | Status: AC

## 2023-07-16 NOTE — ED Triage Notes (Signed)
Pt c/o dysuria, urinary frequency and blood in urine that started today.

## 2023-07-16 NOTE — Discharge Instructions (Addendum)
These increase oral fluid intake Take medications as prescribed Will call you with recommendations if labs are abnormal If you have worsening symptoms please return to urgent care to be reevaluated I recommend you avoid as regular intervals and avoid holding urine.

## 2023-07-17 NOTE — ED Provider Notes (Addendum)
[facility]    CSN: [id number] Arrival date & time: 07/16/23  1835      History   Chief Complaint Chief Complaint  Patient presents with   Dysuria    HPI Karen Burnett is a 27 y.o. female comes to the urgent care with 1 day history of dysuria, urgency and frequency.  Symptoms started earlier today.  She also noticed some blood in the urine.  She denies any flank pain.  No lower abdominal pain, nausea or vomiting.  In the recent days patient has held her urine at work because she has been very busy at work.  No vaginal discharge.  Last menstrual period ended about a week ago.  HPI  Past Medical History:  Diagnosis Date   Anxiety    Ultrasound recheck of fetal pyelectasis, antepartum 05/24/2017   Vaginal Pap smear, abnormal 10/2016   ASCUS, HPV neg so needs routine screen    Patient Active Problem List   Diagnosis Date Noted   Other fatigue 08/14/2022   Overweight 12/19/2018   Generalized anxiety disorder 01/02/2017   Family history of diabetes mellitus 10/01/2015    Past Surgical History:  Procedure Laterality Date   NO PAST SURGERIES      OB History     Gravida  1   Para  1   Term  1   Preterm  0   AB  0   Living  1      SAB  0   IAB  0   Ectopic  0   Multiple  0   Live Births  1            Home Medications    Prior to Admission medications   Medication Sig Start Date End Date Taking? Authorizing Provider  cephALEXin (KEFLEX) 500 MG capsule Take 1 capsule (500 mg total) by mouth 2 (two) times daily for 5 days. 07/16/23 07/21/23 Yes Lillien Petronio, Britta Mccreedy, MD  pantoprazole (PROTONIX) 20 MG tablet TAKE 1 TABLET BY MOUTH EVERY DAY 04/28/19 11/22/20  Mirian Mo, MD  sertraline (ZOLOFT) 100 MG tablet Take 1.5 tablets (150 mg total) by mouth daily. 01/05/20 11/22/20  Mirian Mo, MD    Family History Family History  Problem Relation Age of Onset   Thyroid disease Mother    Diabetes Father    Arthritis Father    Psoriasis Father     Psoriasis Brother    Diabetes Maternal Aunt    Diabetes Maternal Uncle    Diabetes Paternal Aunt    Diabetes Paternal Uncle    Diabetes Maternal Grandmother    Diabetes Maternal Grandfather    Hypertension Maternal Grandfather    Diabetes Paternal Grandmother    Diabetes Paternal Grandfather     Social History Social History   Tobacco Use   Smoking status: Never    Passive exposure: Never   Smokeless tobacco: Never  Substance Use Topics   Alcohol use: No    Alcohol/week: 0.0 standard drinks of alcohol   Drug use: No     Allergies   Patient has no known allergies.   Review of Systems Review of Systems As per HPI  Physical Exam Triage Vital Signs ED Triage Vitals  Encounter Vitals Group     BP 07/16/23 1952 114/81     Systolic BP Percentile --      Diastolic BP Percentile --      Pulse Rate 07/16/23 1952 81     Resp 07/16/23 1952 16  Temp 07/16/23 1952 98.5 F (36.9 C)     Temp Source 07/16/23 1952 Oral     SpO2 07/16/23 1952 98 %     Weight --      Height --      Head Circumference --      Peak Flow --      Pain Score 07/16/23 1951 6     Pain Loc --      Pain Education --      Exclude from Growth Chart --    No data found.  Updated Vital Signs BP 114/81 (BP Location: Left Arm)   Pulse 81   Temp 98.5 F (36.9 C) (Oral)   Resp 16   SpO2 98%   Visual Acuity Right Eye Distance:   Left Eye Distance:   Bilateral Distance:    Right Eye Near:   Left Eye Near:    Bilateral Near:     Physical Exam Vitals and nursing note reviewed.  Constitutional:      Appearance: Normal appearance.  Cardiovascular:     Rate and Rhythm: Normal rate and regular rhythm.     Pulses: Normal pulses.     Heart sounds: Normal heart sounds.  Pulmonary:     Effort: Pulmonary effort is normal.     Breath sounds: Normal breath sounds.  Abdominal:     General: Bowel sounds are normal.     Palpations: Abdomen is soft.  Neurological:     Mental Status: She is alert.       UC Treatments / Results  Labs (all labs ordered are listed, but only abnormal results are displayed) Labs Reviewed  POCT URINALYSIS DIP (MANUAL ENTRY) - Abnormal; Notable for the following components:      Result Value   Blood, UA moderate (*)    Leukocytes, UA Small (1+) (*)    All other components within normal limits  URINE CULTURE    EKG   Radiology No results found.  Procedures Procedures (including critical care time)  Medications Ordered in UC Medications - No data to display  Initial Impression / Assessment and Plan / UC Course  I have reviewed the triage vital signs and the nursing notes.  Pertinent labs & imaging results that were available during my care of the patient were reviewed by me and considered in my medical decision making (see chart for details).     1.  Acute cystitis with hematuria: Point-of-care urinalysis is positive for blood and leukocyte esterase Urine cultures have been sent Keflex 500 mg twice daily for 5 days Patient is advised to increase oral fluid intake Return precautions given. Final Clinical Impressions(s) / UC Diagnoses   Final diagnoses:  Acute cystitis with hematuria     Discharge Instructions      These increase oral fluid intake Take medications as prescribed Will call you with recommendations if labs are abnormal If you have worsening symptoms please return to urgent care to be reevaluated I recommend you avoid as regular intervals and avoid holding urine.   ED Prescriptions     Medication Sig Dispense Auth. Provider   cephALEXin (KEFLEX) 500 MG capsule Take 1 capsule (500 mg total) by mouth 2 (two) times daily for 5 days. 10 capsule Jamarri Vuncannon, Britta Mccreedy, MD      PDMP not reviewed this encounter.   Merrilee Jansky, MD 07/17/23 1553    Merrilee Jansky, MD 07/17/23 [phone]

## 2024-05-29 ENCOUNTER — Encounter: Payer: Self-pay | Admitting: Family Medicine

## 2024-05-29 ENCOUNTER — Ambulatory Visit (INDEPENDENT_AMBULATORY_CARE_PROVIDER_SITE_OTHER): Admitting: Family Medicine

## 2024-05-29 VITALS — BP 105/74 | HR 94 | Ht 62.0 in | Wt 182.0 lb

## 2024-05-29 DIAGNOSIS — R5383 Other fatigue: Secondary | ICD-10-CM | POA: Diagnosis not present

## 2024-05-29 NOTE — Progress Notes (Signed)
    SUBJECTIVE:   Chief compliant/HPI: annual examination  Karen Burnett is a 28 y.o. who presents today for an annual exam.   Review of systems form notable for anxiety.  Patient has increased anxiety with racing thoughts, increased heart rate, sometimes gets so nervous and is unable to do the things she needs to do. Has not missed work before but this is because she really need to work to support her son. Feels like she is having a lot of stress in her life right now, including stress from work in the last 6 months as well as life stuff. Is not interested in medication right now.  GAD- 7 score was 10.   Is still experiencing fatigue. Normal periods. Has been chronically fatigued for years. Sleeps 6-8 hours at night. Does snore at night. Was told she had big tonsils. Mom has hx of Hypothyroidism. Also experiencing some forgetful.   Updated history tabs and problem list.   OBJECTIVE:   BP 105/74   Pulse 94   Ht 5' 2 (1.575 m)   Wt 182 lb (82.6 kg)   SpO2 100%   BMI 33.29 kg/m   General: A&O, NAD HEENT: No sign of trauma, EOM grossly intact Cardiac: RRR, no m/r/g Respiratory: CTAB, normal WOB, no w/c/r GI: Soft, NTTP, non-distended  Extremities: NTTP, no peripheral edema. Neuro: Normal gait, moves all four extremities appropriately. Psych: Appropriate mood and affect  ASSESSMENT/PLAN:   1. Other fatigue (Primary) Patient with history of chronic fatigue. May be a combination of work stress, depression, and anxiety. However, will rule out sleep apnea given history of snoring and enlarged tonsils. Also ordered TSH d/t FMHx of hypothyroidism.  - TSH - Cpap titration; Future - Home sleep test   Annual Examination  See AVS for age appropriate recommendations.   PHQ score 9, reviewed and discussed. Blood pressure reviewed and at goal.  Asked about intimate partner violence and patient reports no concerns.  The patient currently uses nothing for contraception, and is okay  with getting pregnant with her monogamous partner. Recommended daily prenatal vitamin Advanced directives not discussed  Considered the following items based upon USPSTF recommendations: HIV testing: discussed Hepatitis C: discussed Hepatitis B: discussed Syphilis if at high risk: discussed GC/CT not at high risk and not ordered. Lipid panel (nonfasting or fasting) discussed based upon AHA recommendations and not ordered.  Consider repeat every 4-6 years.  Reviewed risk factors for latent tuberculosis and not indicated  Discussed family history, BRCA testing not indicated. Tool used to risk stratify was Pedigree Assessment tool  Cervical cancer screening: prior Pap reviewed, repeat due in 2026 Immunizations UTD   Follow up in 1 year or sooner if indicated.   Karen Naas, MD [facility]

## 2024-05-29 NOTE — Patient Instructions (Addendum)
 Today at your annual preventive visit we talked about the following measures:  Anxiety: please continue to use your coping mechanisms as you learned in therapy. If you are interested in starting medication or starting therapy again, please make an appointment to discuss this further.  Fatigue: I am ordering a sleep study and some labwork today. I will call you if your lab work is abnormal. I would also encourage you to take a prenatal vital daily, this may help with the fatigue.   Things to keep yourself healthy:  - Exercise at least 30-45 minutes a day, 3-4 days a week. >150 min of moderate intensity per week is advised. - Eat a low-fat diet with lots of fruits and vegetables, up to 7-9 servings per day. - Seatbelts can save your life. Wear them always. - Smoke detectors on every level of your home, check batteries every year. - Eye Doctor - have an eye exam every 1-2 years - Safe sex - if you may be exposed to STDs, use a condom. - Alcohol If you drink, do it moderately, less than 1 drink per day. - Health Care Power of Attorney.  Choose someone to speak for you if you are not able. - Depression is common in our stressful world.If you're feeling down or losing interest in things you normally enjoy, please come in for a visit. - Violence - If anyone is threatening or hurting you, please call 911 immediately. There are also resources to help you, please call to schedule an appointment if you need these.

## 2024-05-30 ENCOUNTER — Ambulatory Visit: Payer: Self-pay | Admitting: Family Medicine

## 2024-05-30 LAB — TSH: TSH: 0.614 u[IU]/mL (ref 0.450–4.500)

## 2024-10-08 ENCOUNTER — Ambulatory Visit
